# Patient Record
Sex: Female | Born: 1935 | Race: White | Hispanic: No | Smoking: Never smoker
Health system: Southern US, Community
[De-identification: ages and names within clinical notes are randomized; demographics above are authoritative.]

## PROBLEM LIST (undated history)

## (undated) DIAGNOSIS — D649 Anemia, unspecified: Secondary | ICD-10-CM

## (undated) DIAGNOSIS — I480 Paroxysmal atrial fibrillation: Secondary | ICD-10-CM

## (undated) DIAGNOSIS — H9190 Unspecified hearing loss, unspecified ear: Secondary | ICD-10-CM

## (undated) DIAGNOSIS — I2510 Atherosclerotic heart disease of native coronary artery without angina pectoris: Secondary | ICD-10-CM

## (undated) DIAGNOSIS — R55 Syncope and collapse: Secondary | ICD-10-CM

## (undated) DIAGNOSIS — R609 Edema, unspecified: Secondary | ICD-10-CM

## (undated) DIAGNOSIS — I10 Essential (primary) hypertension: Secondary | ICD-10-CM

## (undated) DIAGNOSIS — J069 Acute upper respiratory infection, unspecified: Secondary | ICD-10-CM

## (undated) DIAGNOSIS — Z950 Presence of cardiac pacemaker: Secondary | ICD-10-CM

## (undated) DIAGNOSIS — I442 Atrioventricular block, complete: Secondary | ICD-10-CM

## (undated) DIAGNOSIS — R600 Localized edema: Secondary | ICD-10-CM

## (undated) DIAGNOSIS — N183 Chronic kidney disease, stage 3 (moderate): Secondary | ICD-10-CM

## (undated) DIAGNOSIS — K219 Gastro-esophageal reflux disease without esophagitis: Secondary | ICD-10-CM

## (undated) DIAGNOSIS — R1013 Epigastric pain: Secondary | ICD-10-CM

## (undated) DIAGNOSIS — I255 Ischemic cardiomyopathy: Secondary | ICD-10-CM

## (undated) HISTORY — DX: Syncope and collapse: R55

## (undated) HISTORY — DX: Atrioventricular block, complete: I44.2

## (undated) HISTORY — DX: Localized edema: R60.0

## (undated) HISTORY — DX: Essential (primary) hypertension: I10

## (undated) HISTORY — PX: OTHER SURGICAL HISTORY: SHX169

## (undated) HISTORY — PX: INSERT / REPLACE / REMOVE PACEMAKER: SUR710

## (undated) HISTORY — DX: Edema, unspecified: R60.9

---

## 1997-12-15 ENCOUNTER — Inpatient Hospital Stay (HOSPITAL_COMMUNITY): Admission: RE | Admit: 1997-12-15 | Discharge: 1997-12-17 | Payer: Self-pay | Admitting: Cardiology

## 2004-08-06 ENCOUNTER — Ambulatory Visit: Payer: Self-pay

## 2004-10-11 ENCOUNTER — Ambulatory Visit: Payer: Self-pay

## 2004-11-17 ENCOUNTER — Ambulatory Visit: Payer: Self-pay | Admitting: Internal Medicine

## 2004-12-20 ENCOUNTER — Ambulatory Visit: Payer: Self-pay | Admitting: Internal Medicine

## 2005-01-20 ENCOUNTER — Ambulatory Visit: Payer: Self-pay | Admitting: Internal Medicine

## 2005-02-21 ENCOUNTER — Ambulatory Visit: Payer: Self-pay | Admitting: Internal Medicine

## 2005-03-29 ENCOUNTER — Ambulatory Visit: Payer: Self-pay | Admitting: Internal Medicine

## 2005-05-02 ENCOUNTER — Ambulatory Visit: Payer: Self-pay | Admitting: Internal Medicine

## 2005-06-03 ENCOUNTER — Ambulatory Visit: Payer: Self-pay | Admitting: Family Medicine

## 2005-07-04 ENCOUNTER — Ambulatory Visit: Payer: Self-pay | Admitting: Internal Medicine

## 2005-08-09 ENCOUNTER — Ambulatory Visit: Payer: Self-pay | Admitting: Internal Medicine

## 2005-09-09 ENCOUNTER — Ambulatory Visit: Payer: Self-pay | Admitting: Internal Medicine

## 2005-10-13 ENCOUNTER — Ambulatory Visit: Payer: Self-pay | Admitting: Internal Medicine

## 2005-11-11 ENCOUNTER — Ambulatory Visit: Payer: Self-pay | Admitting: Internal Medicine

## 2005-12-12 ENCOUNTER — Ambulatory Visit: Payer: Self-pay | Admitting: Internal Medicine

## 2006-01-13 ENCOUNTER — Ambulatory Visit: Payer: Self-pay | Admitting: Internal Medicine

## 2006-02-10 ENCOUNTER — Ambulatory Visit: Payer: Self-pay | Admitting: Internal Medicine

## 2006-03-10 ENCOUNTER — Ambulatory Visit: Payer: Self-pay | Admitting: Internal Medicine

## 2006-04-17 ENCOUNTER — Ambulatory Visit: Payer: Self-pay | Admitting: Internal Medicine

## 2006-05-22 ENCOUNTER — Ambulatory Visit: Payer: Self-pay | Admitting: Internal Medicine

## 2006-07-04 ENCOUNTER — Ambulatory Visit: Payer: Self-pay | Admitting: Internal Medicine

## 2006-08-01 ENCOUNTER — Ambulatory Visit: Payer: Self-pay | Admitting: Internal Medicine

## 2006-08-29 ENCOUNTER — Ambulatory Visit: Payer: Self-pay | Admitting: Internal Medicine

## 2006-09-26 ENCOUNTER — Ambulatory Visit: Payer: Self-pay | Admitting: Internal Medicine

## 2006-10-24 ENCOUNTER — Ambulatory Visit: Payer: Self-pay | Admitting: Internal Medicine

## 2006-11-20 ENCOUNTER — Ambulatory Visit: Payer: Self-pay | Admitting: Internal Medicine

## 2007-01-16 ENCOUNTER — Ambulatory Visit: Payer: Self-pay | Admitting: Internal Medicine

## 2007-02-12 ENCOUNTER — Ambulatory Visit: Payer: Self-pay | Admitting: Internal Medicine

## 2007-03-13 ENCOUNTER — Ambulatory Visit: Payer: Self-pay | Admitting: Cardiology

## 2007-04-09 ENCOUNTER — Ambulatory Visit: Payer: Self-pay | Admitting: Internal Medicine

## 2007-05-07 ENCOUNTER — Ambulatory Visit: Payer: Self-pay | Admitting: Internal Medicine

## 2007-06-04 ENCOUNTER — Ambulatory Visit: Payer: Self-pay | Admitting: Internal Medicine

## 2007-07-03 ENCOUNTER — Ambulatory Visit: Payer: Self-pay | Admitting: Internal Medicine

## 2007-07-31 ENCOUNTER — Ambulatory Visit: Payer: Self-pay | Admitting: Internal Medicine

## 2007-08-28 ENCOUNTER — Ambulatory Visit: Payer: Self-pay | Admitting: Internal Medicine

## 2007-09-24 ENCOUNTER — Ambulatory Visit: Payer: Self-pay | Admitting: Internal Medicine

## 2007-12-25 ENCOUNTER — Ambulatory Visit: Payer: Self-pay | Admitting: Internal Medicine

## 2008-02-05 ENCOUNTER — Ambulatory Visit: Payer: Self-pay | Admitting: Internal Medicine

## 2008-02-19 ENCOUNTER — Ambulatory Visit: Payer: Self-pay | Admitting: Internal Medicine

## 2008-02-19 LAB — CONVERTED CEMR LAB
BUN: 25 mg/dL — ABNORMAL HIGH (ref 6–23)
Basophils Absolute: 0 10*3/uL (ref 0.0–0.1)
Eosinophils Absolute: 0.2 10*3/uL (ref 0.0–0.7)
Eosinophils Relative: 3.1 % (ref 0.0–5.0)
GFR calc Af Amer: 57 mL/min
GFR calc non Af Amer: 47 mL/min
HCT: 29.2 % — ABNORMAL LOW (ref 36.0–46.0)
MCHC: 33.4 g/dL (ref 30.0–36.0)
MCV: 83.8 fL (ref 78.0–100.0)
Monocytes Absolute: 0.5 10*3/uL (ref 0.1–1.0)
Platelets: 191 10*3/uL (ref 150–400)
Potassium: 4 meq/L (ref 3.5–5.1)
RDW: 16.2 % — ABNORMAL HIGH (ref 11.5–14.6)
WBC: 5.7 10*3/uL (ref 4.5–10.5)

## 2008-02-26 ENCOUNTER — Ambulatory Visit (HOSPITAL_COMMUNITY): Admission: RE | Admit: 2008-02-26 | Discharge: 2008-02-26 | Payer: Self-pay | Admitting: Internal Medicine

## 2008-02-26 ENCOUNTER — Ambulatory Visit: Payer: Self-pay | Admitting: Internal Medicine

## 2008-03-12 ENCOUNTER — Ambulatory Visit: Payer: Self-pay

## 2008-08-19 ENCOUNTER — Ambulatory Visit: Payer: Self-pay | Admitting: Internal Medicine

## 2008-12-31 ENCOUNTER — Encounter (INDEPENDENT_AMBULATORY_CARE_PROVIDER_SITE_OTHER): Payer: Self-pay | Admitting: *Deleted

## 2009-03-17 ENCOUNTER — Ambulatory Visit: Payer: Self-pay | Admitting: Internal Medicine

## 2009-03-17 DIAGNOSIS — Z8669 Personal history of other diseases of the nervous system and sense organs: Secondary | ICD-10-CM | POA: Insufficient documentation

## 2009-03-17 HISTORY — DX: Personal history of other diseases of the nervous system and sense organs: Z86.69

## 2009-05-04 ENCOUNTER — Ambulatory Visit: Payer: Self-pay | Admitting: Internal Medicine

## 2009-08-03 ENCOUNTER — Ambulatory Visit: Payer: Self-pay | Admitting: Internal Medicine

## 2009-08-24 ENCOUNTER — Ambulatory Visit: Payer: Self-pay | Admitting: Internal Medicine

## 2009-12-13 ENCOUNTER — Ambulatory Visit: Payer: Self-pay | Admitting: Internal Medicine

## 2010-05-07 ENCOUNTER — Ambulatory Visit: Payer: Self-pay | Admitting: Internal Medicine

## 2010-08-12 DIAGNOSIS — E559 Vitamin D deficiency, unspecified: Secondary | ICD-10-CM

## 2010-08-12 DIAGNOSIS — E785 Hyperlipidemia, unspecified: Secondary | ICD-10-CM

## 2010-08-12 DIAGNOSIS — I10 Essential (primary) hypertension: Secondary | ICD-10-CM | POA: Insufficient documentation

## 2010-08-12 HISTORY — DX: Vitamin D deficiency, unspecified: E55.9

## 2010-08-12 HISTORY — DX: Essential (primary) hypertension: I10

## 2010-08-12 HISTORY — DX: Hyperlipidemia, unspecified: E78.5

## 2010-08-30 ENCOUNTER — Ambulatory Visit: Payer: Self-pay | Admitting: Internal Medicine

## 2010-10-12 NOTE — Cardiovascular Report (Signed)
Summary: TTM   TTM   Imported By: Roderic Ovens 12/25/2009 15:58:51  _____________________________________________________________________  External Attachment:    Type:   Image     Comment:   External Document

## 2010-10-12 NOTE — Procedures (Signed)
Summary: Cardiology Device Clinic    Allergies: No Known Drug Allergies  PPM Specifications Following MD:  Sherryl Manges, MD     PPM Vendor:  St [name]     PPM Model Number:  [phone]     PPM Serial Number:  [id number] PPM DOI:  02/26/2008     PPM Implanting MD:  Sherryl Manges, MD  Lead 1    Location: RA     DOI: 12/16/1997     Model #: [id number]     Serial #: [id number]     Status: active Lead 2    Location: RV     DOI: 12/16/1997     Model #: 1346T     Serial #: [id number]     Status: active   Indications:  Syncope, complete heart block   PPM Follow Up Remote Check?  No Battery Voltage:  2.80 V     Battery Est. Longevity:  10 years     Pacer Dependent:  No       PPM Device Measurements Atrium  Amplitude: 1.7 mV, Impedance: 414 ohms, Threshold: 0.5 V at 0.4 msec Right Ventricle  Amplitude: 7.5 mV, Impedance: 530 ohms, Threshold: 0.5 V at 0.4 msec  Episodes MS Episodes:  14     Percent Mode Switch:  <1%     Coumadin:  No Atrial Pacing:  38%     Ventricular Pacing:  37%  Parameters Mode:  DDDR     Lower Rate Limit:  70     Upper Rate Limit:  105 Paced AV Delay:  250     Sensed AV Delay:  250 Next Cardiology Appt Due:  10/13/2010 Tech Comments:  No parameter changes.  14 mode switch episodes the longest 5:25 hours, - coumadin, ventricular rates 80-170bpm.  TTM's with [employer]. ROV 3months in the [place] office to check A-fib episodes and turn off EGM storage at that time.   Altha Harm, LPN  May 07, 2010 11:53 AM

## 2010-10-14 NOTE — Cardiovascular Report (Signed)
Summary: Office Visit   Office Visit   Imported By: Roderic Ovens 09/24/2010 09:24:39  _____________________________________________________________________  External Attachment:    Type:   Image     Comment:   External Document

## 2010-10-14 NOTE — Procedures (Signed)
Summary: Cardiology Device Clinic  Medications Added POTASSIUM CHLORIDE CR 10 MEQ CR-CAPS (POTASSIUM CHLORIDE) one by mouth daily METOPROLOL SUCCINATE 50 MG XR24H-TAB (METOPROLOL SUCCINATE) 1 1/2 by mouth daily ETODOLAC 400 MG TABS (ETODOLAC) as needed ZESTRIL 40 MG TABS (LISINOPRIL) one by mouth daily FLONASE 50 MCG/ACT SUSP (FLUTICASONE PROPIONATE) as needed      Allergies Added: ! PENICILLIN  Current Medications (verified): 1)  Aspirin 81 Mg Tabs (Aspirin) .... Once Daily 2)  Gemfibrozil 600 Mg Tabs (Gemfibrozil) .... Once Am, Once Pm 3)  Potassium Chloride Cr 10 Meq Cr-Caps (Potassium Chloride) .... One By Mouth Daily 4)  Furosemide 20 Mg Tabs (Furosemide) .... Once Daily 5)  Cyanocobalamin 1000 Mcg/ml Soln (Cyanocobalamin) .[name].. 1 Injection Monthly 6)  Metoprolol Succinate 50 Mg Xr24h-Tab (Metoprolol Succinate) .[name].. 1 1/2 By Mouth Daily 7)  Etodolac 400 Mg Tabs (Etodolac) .... As Needed 8)  Zestril 40 Mg Tabs (Lisinopril) .... One By Mouth Daily 9)  Flonase 50 Mcg/act Susp (Fluticasone Propionate) .... As Needed  Allergies (verified): 1)  ! Penicillin  PPM Specifications Following MD:  Sherryl Manges, MD     PPM Vendor:  St [name]     PPM Model Number:  [id number]     PPM Serial Number:  [id number] PPM DOI:  02/26/2008     PPM Implanting MD:  Sherryl Manges, MD  Lead 1    Location: RA     DOI: 12/16/1997     Model #: 1388TC     Serial #: [id number]     Status: active Lead 2    Location: RV     DOI: 12/16/1997     Model #: 1346T     Serial #: [id number]     Status: active   Indications:  Syncope, complete heart block   PPM Follow Up Remote Check?  No Battery Voltage:  2.79 V     Battery Est. Longevity:  4.5 years     Pacer Dependent:  No       PPM Device Measurements Atrium  Amplitude: 2.0 mV, Impedance: 445 ohms, Threshold: 0.5 V at 0.4 msec Right Ventricle  Amplitude: 9.8 mV, Impedance: 574 ohms, Threshold: 0.5 V at 0.4 msec  Episodes MS Episodes:  28     Percent Mode Switch:  <1%      Coumadin:  No Atrial Pacing:  53%     Ventricular Pacing:  60%  Parameters Mode:  DDDR     Lower Rate Limit:  70     Upper Rate Limit:  105 Paced AV Delay:  250     Sensed AV Delay:  250 Next Cardiology Appt Due:  11/11/2010 Tech Comments:  Ms. Krull was seen today in [place] for review of any mode switch episodes since last seen 05/07/10, at which time she had A-fib with RVR lasting 7 hours.  On interrogation today she has had a total of 28 mode switch episodes the longest 4:50 hours.  Her ventricular rates were controlled in the 65-85 bpm range.  I will show this to Dr. Graciela Husbands and have Ms. Boulet return in 3 months to the [place] office. .sign

## 2010-10-24 ENCOUNTER — Encounter: Payer: Self-pay | Admitting: Internal Medicine

## 2010-10-24 DIAGNOSIS — I442 Atrioventricular block, complete: Secondary | ICD-10-CM

## 2010-11-18 NOTE — Cardiovascular Report (Signed)
Summary: TTM   TTM   Imported By: Roderic Ovens 11/10/2010 14:42:22  _____________________________________________________________________  External Attachment:    Type:   Image     Comment:   External Document

## 2011-01-10 ENCOUNTER — Encounter: Payer: Self-pay | Admitting: Internal Medicine

## 2011-01-12 ENCOUNTER — Encounter: Payer: Self-pay | Admitting: Internal Medicine

## 2011-01-17 DIAGNOSIS — N3001 Acute cystitis with hematuria: Secondary | ICD-10-CM

## 2011-01-17 DIAGNOSIS — J209 Acute bronchitis, unspecified: Secondary | ICD-10-CM | POA: Insufficient documentation

## 2011-01-17 HISTORY — DX: Acute cystitis with hematuria: N30.01

## 2011-01-17 HISTORY — DX: Acute bronchitis, unspecified: J20.9

## 2011-01-24 ENCOUNTER — Encounter: Payer: Self-pay | Admitting: Internal Medicine

## 2011-01-24 DIAGNOSIS — I442 Atrioventricular block, complete: Secondary | ICD-10-CM

## 2011-01-25 NOTE — Assessment & Plan Note (Signed)
[facility]                         ELECTROPHYSIOLOGY OFFICE NOTE   NAME:Judith Flores, Judith Flores                       MRN:          [id number]  DATE:08/19/2008                            DOB:          [birth year]    Mrs. Bonenberger is seen in followup for pacemaker implant for syncope and  heart block.  She continues to do well.  Her edema that was evident last  May prior to device generator placement is much improved.  She is using  p.r.n. Lasix for this.   She continues to take Zestoretic 20/12.5, potassium 10, aspirin, and  gemfibrozil.  She is also on iron being managed by [facility] for anemia.   On examination, her blood pressure was 117/66, her pulse was 91.  Her  weight was 193, which was down 12 pounds.  Her lungs were clear.  Heart  sounds were regular.  The device stump was well healed.  The abdomen was  soft.  The extremities were without edema.   Interrogation of her St. [name] pulse generator demonstrates a P-wave of  2.2, the impedance of 100, threshold 0.5 at 0.4.  The R-wave was greater  than 12, the impedance of 1040 and threshold of 1 volt at 0.4.  Battery  voltage is 2.79.   IMPRESSION:  1. Intermittent complete heart block.  2. Status post pacer for the above.  3. Syncope related to the above.  4. Edema - resolved.   Mrs. Panther is doing well.  There are no acute issues.  We will see her  again in 9 months' time with the anniversary of the device.     [name], MD, [facility]  Electronically Signed    SCK/MedQ  DD: 08/19/2008  DT: 08/20/2008  Job #: [id number]

## 2011-01-25 NOTE — Assessment & Plan Note (Signed)
Judith Flores                         ELECTROPHYSIOLOGY OFFICE NOTE   NAME:Judith Flores                       MRN:          [id number]  DATE:02/05/2008                            DOB:          12-01-1933    Judith Flores is status post pacemaker implantation for syncope and  intermittent complete heart block.  She continues to do quite well  without complaints of chest pain or shortness of breath.  She has noted,  however, significant peripheral edema; she has chronic left greater than  right edema, but this is considerably worse.  She denies any change in  her diet or long travel.  She has taken her p.r.n. furosemide of late  with some mild improvement.   Her medications include the above as well as Zestoretic 20/12.5,  aspirin, Niaspan, gemfibrozil, and potassium.   PHYSICAL EXAMINATION:  VITAL SIGNS:  Her blood pressure today was 146/87  with a pulse of 93 and her weight is 205 compared to 194 a year ago.  NECK:  Veins were flat.  LUNGS:  Clear.  HEART:  Sounds were regular with an early systolic murmur.  ABDOMEN:  Soft.  EXTREMITIES:  She had 2+ edema in the left and 1+ edema in the right.  SKIN:  Warm and dry.   Interrogation of her St. [name] Pacesetter pulse generator demonstrates an  impedance in the atrium of 520 and in the ventricle of 699.  Battery  voltage is 2.39.  She has reached ERI.  Recommendations were that her  device be changed within a month.   IMPRESSION:  1. Intermittent complete heart block and sinus node dysfunction.  2. Status post pacer for the above.  3. Syncope related to intermittent complete heart block and sinus node      dysfunction.  4. Peripheral edema - asymmetric.   We will plan to undertake device generator replacement.  I reviewed with  her the potential benefits and potential risks including but not limited  to infection and lead fracture.  She understands these risks and would  like to proceed.   We  will also put her back on her furosemide to take every other day for  10 days to see if we can ameliorate some of her edema.    Judith Salvia, MD, [facility]  Electronically Signed   SCK/MedQ  DD: 02/05/2008  DT: 02/05/2008  Job #: [id number]   cc:   Judith Flores

## 2011-01-25 NOTE — Assessment & Plan Note (Signed)
[facility]                        [place] CARDIOLOGY OFFICE NOTE   JARROD, BODKINS                       MRN:          [id number]  DATE:08/24/2009                            DOB:          02/28/1934    Judith Flores is seen in follow up for intermittent complete heart block  with a previously implanted pacemaker for which she underwent generator  replacement in the spring of 2009.  She has had no recurrent syncope.  She has no complaints of chest pain, shortness of breath, or exercise  intolerance.   CURRENT MEDICATIONS:  1. Lisinopril hydrochlorothiazide.  2. Furosemide on an as needed basis.  3. Aspirin.  4. Gemfibrozil 600 b.i.d.  5. Potassium.  6. Iron.   EXAMINATION:  VITAL SIGNS:  Her blood pressure is 104/59 with a pulse of  91, weight was 193, which is down a few pounds.  O2 saturation 96.  CONSTITUTIONAL:  She was in no acute distress.  LUNGS:  Clear.  HEART:  Sounds were regular.  ABDOMEN:  Soft.  EXTREMITIES:  Without edema.   DIAGNOSTIC STUDIES:  Interrogation of her pacemaker (St. [name] Zephyr)  with an amplitude in the atrium of 2.5 and ventricle of 8.1, thresholds  were 0.5 and 0.4 in both chambers.  Atrial impedance was 455 and the  ventricular impedance was 840.  Heart rate excursion is adequate.  She  is atrially paced and ventricularly paced both about 36% of the time and  A-sensed/V-sensed about 63% of the time.   IMPRESSION:  1. Intermittent complete heart block.  2. Syncope.  3. Evidence of intermittent sinus node dysfunction.  4. Hypertension.  5. Peripheral edema.   MEDICAL DECISION MAKING:  [name] is doing well.  We will plan to  continue her on her current medications.  Her blood work was checked  today by her primary care physician; we do not have those results.   Will see her again in 6 months' time in the device clinic.     [name], MD, [facility]  Electronically Signed    SCK/MedQ  DD:  08/24/2009  DT: 08/24/2009  Job #: [id number]   cc:   [facility]

## 2011-01-25 NOTE — Discharge Summary (Signed)
NAMERECHEL, DELOSREYES NO.:  [id number]   MEDICAL RECORD NO.:  [id number]          PATIENT TYPE:  OIB   LOCATION:  2852                         FACILITY:  [facility]   PHYSICIAN:  [name], MD, FACCDATE OF BIRTH:  [birth year]   DATE OF ADMISSION:  02/26/2008  DATE OF DISCHARGE:  02/26/2008                               DISCHARGE SUMMARY   Dictation time and exam greater than 25 minutes.   FINAL DIAGNOSES:  1. Pacemaker implanted for syncope and intermittent complete heart      block is at elective replacement indicator.  2. Generator change February 26, 2008, with implant of the [place]. [name] [place]      XL DR [id number] dual-chamber pacemaker Dr. Sherryl Manges.   SECONDARY DIAGNOSES:  1. History of intermittent complete heart block, sinus node      dysfunction, and syncope.  2. Peripheral edema/asymmetric, procedure February 26, 2008, explant of      existing dual-chamber pacemaker with implant of the St. [name] Zephyr      XL DR dual-chamber pacemaker Dr. Sherryl Manges.   BRIEF HISTORY:  Judith Flores is a 77 year old female, she is status post  pacemaker implantation for syncope and intermittent complete heart  block. Currently, she is not having complaints of chest pain or  shortness of breath.  She does note significant peripheral edema.  Her  Lasix was increased from every other day dosing on the office visit, Feb 05, 2008.   Interrogation of her [employer] which is her current device  shows that it has reached ERI.  We will change the device out within a  month.   HOSPITAL COURSE:  The patient presents electively on February 26, 2008, the  [facility]. [name] pacemaker was explanted.  The St. [name] Zephyr device was  implanted.  The patient had no postprocedural complications.  She will  go home with a prescription for Darvocet for mild pain at the pacemaker  site.  She is instructed to remove the bandage on the morning of  Wednesday, February 27, 2008, and to leave the incision open  to the air.  She is to keep the incision dry for the next 7 days, to sponge bathe  until Tuesday, March 04, 2008.   MEDICATIONS AT DISCHARGE:  1. Darvocet-N 100 1-2 tabs every 4-6 hours as needed for pain.  2. Klor-Con 10 mEq twice daily.  3. Lasix 20 mg daily as needed.  4. Gemfibrozil 600 mg twice daily.  5. Niaspan 500 mg daily.  6. Enteric-coated aspirin 81 mg daily.  7. Zestoretic 200/12.5 one tab daily.  8. Ferrous sulfate 2 tabs daily.  9. Vitamin B12 shot monthly.   She follows up at [facility], [address]  Wednesday March 12, 2008, at 9:20 for incision check.   LABORATORY STUDIES:  Pertinent to this admission, protime is 12.4, INR  1.0.  Sodium 140, potassium 4, chloride 106, carbonate 27, glucose 104,  BUN is 25, creatinine 1.2, white cells 5.7, hemoglobin 9.7, hematocrit  29.2, and platelets 191,000.      Maple Mirza, PA  [name], MD, [facility]  Electronically Signed    GM/MEDQ  D:  02/26/2008  T:  02/27/2008  Job:  [id number]   cc:   Laray Anger

## 2011-01-25 NOTE — Assessment & Plan Note (Signed)
[facility]                        [place] CARDIOLOGY OFFICE NOTE   Judith Flores, Judith Flores                       MRN:          [id number]  DATE:05/07/2010                            DOB:          June 13, 1934    Judith Flores is seen in followup for an intermittent complete heart block  with a previously implanted pacemaker for which she underwent generator  replaced in the spring of 2009.  She has had no recurrent syncope.  She  has had no chest pain or shortness of breath.  She does have problems  with peripheral edema.   Her medications include aspirin, gemfibrozil, furosemide p.r.n.,  lisinopril and metoprolol succinate.   On exam today, her blood pressure was not too bad at 143/79, her weight  was up 6 pounds in the last year at 199.8, her pulse was 77.  Her lungs  were clear.  Heart sounds were regular.  The abdomen was soft.  Extremities were without edema.  She is in no acute distress.   Interrogation of her Zephyr pulse generator demonstrates a P-wave of 1.7  with an R-wave of 7.5, impedances were 395/574, thresholds were 0.5 and  0.4 in both chambers.   There was 1 episode of atrial fibrillation identified on December 30  associated with a rapid ventricular response.  The duration of this  episode was approximately 7 hours with an episode of transient  undersensing breaking the episode down into 2.   IMPRESSION:  1. Intermittent complete heart block.  2. Status post pacer for the above.  3. Episodic atrial fibrillation of maximum duration 7 hours.  4. Thromboembolic risk factors notable for age - 2, hypertension - 1,      gender - 1, CHADVASCs score of 4.  5. Peripheral edema.   As relates to the latter, I have suggested that she use her p.r.n.  diuretic a little bit more aggressively.   As relates to atrial fibrillation, I do not think in the absence of a  higher CHADSVASCs score that anticoagulation for a 7-hour episode is  clearly  indicated.  We will continue to track this; unfortunately her  device is not followed by Sabino Gasser who will plan to see her every 3  months as we tried to clarify the length of these episodes of atrial  fibrillation and verifying the fact that they  are appropriately designated as such, but I suspect that is true given  the rapid atrial rate as well as rapid associated ventricular rate.     [name], MD, [facility]  Electronically Signed    SCK/MedQ  DD: 05/07/2010  DT: 05/08/2010  Job #: [id number]   cc:   [facility]

## 2011-01-28 NOTE — Assessment & Plan Note (Signed)
[facility]                         ELECTROPHYSIOLOGY OFFICE NOTE   NAME:HANNONEretria, Manternach                       MRN:          [id number]  DATE:01/16/2007                            DOB:          1933-10-30    Ms. Monda is seen for syncope and intermittent complete heart block  __________ left bundle branch block. She is feeling terrific and doing  well. She does have some peripheral edema.   Her blood pressure medications include Zestoretic.   PHYSICAL EXAMINATION:  VITAL SIGNS:  Her blood pressure is 136/73.  LUNGS:  Clear.  HEART:  Sounds were regular.  without murmurs or gallops.  EXTREMITIES:  There was 1+ peripheral edema.   Interrogation of her St. [name] pulse generator demonstrated normal  pacemaker function with an R wave of 5 and P wave was about 2.  Thresholds were less than 1 volt at 0.4 on both chambers, battery  voltage was 2.7.   IMPRESSION:  1. Intermittent complete heart block.  2. Status post pacer for the above.  3. Left bundle branch block.  4. Peripheral edema.   Ms. Whetsel is stable. We will plan to give Ms. Mcnay a prescription for  Lasix 20 mg to take as needed for fluid overload.   We will see her again in 1 year's time.     [name], MD, [facility]  Electronically Signed    SCK/MedQ  DD: 01/16/2007  DT: 01/16/2007  Job #: [id number]   cc:   Dr. Montez Morita

## 2011-02-28 ENCOUNTER — Encounter (INDEPENDENT_AMBULATORY_CARE_PROVIDER_SITE_OTHER): Payer: PRIVATE HEALTH INSURANCE | Admitting: Internal Medicine

## 2011-02-28 ENCOUNTER — Other Ambulatory Visit: Payer: Self-pay

## 2011-02-28 DIAGNOSIS — R609 Edema, unspecified: Secondary | ICD-10-CM

## 2011-02-28 DIAGNOSIS — I4891 Unspecified atrial fibrillation: Secondary | ICD-10-CM

## 2011-02-28 DIAGNOSIS — I442 Atrioventricular block, complete: Secondary | ICD-10-CM

## 2011-02-28 DIAGNOSIS — Z950 Presence of cardiac pacemaker: Secondary | ICD-10-CM

## 2011-03-01 NOTE — Assessment & Plan Note (Signed)
[facility]                       [place] CARDIOLOGY OFFICE NOTE  Judith Flores, Judith Flores                       MRN:          [id number] DATE:02/28/2011                            DOB:          Aug 31, 1934   Judith Flores is seen in follow-up for pacemaker implanted for intermittent complete heart block.  She underwent device generator replacement in spring 2009.  She has had no recurrent syncope.  She denies chest pain or shortness of breath.  She has had some problems recently with peripheral edema.  MEDICATIONS:  Currently include: 1. Aspirin. 2. Furosemide. 3. Lisinopril 40. 4. Metoprolol 75 b.i.d. 5. Potassium 10 b.i.d. 6. Cholestyramine.  EXAMINATION:  VITAL SIGNS:  Blood pressure is 158/87, pulse of 69. NECK:  Veins are flat.  Her carotids are brisk and full bilaterally without bruits. HEENT:  Normal. BACK:  Without kyphosis. LUNGS:  Clear. HEART:  Regular without murmurs or gallops. ABDOMEN:  Soft. EXTREMITIES:  Have 1 to 2+ edema.  Interrogation of her St. [name] pulse generator demonstrates a P-wave of 2 with a pace impedance of 421, a threshold of 0.375 to 0.4, PR-wave was 0.5 with a pace impedance of 524.  There was no intrinsic ventricular rhythm.  IMPRESSION: 1. Complete heart block. 2. Status post pacer for the above. 3. Hypertension. 4. Peripheral edema. 5. Paroxysmal atrial fibrillation with the longest episode of 7 hours     or so.  We will plan to increase her diuretic for a short period of time.  It is probably appropriate at some point to think about oral anticoagulation, although the duration of atrial fibrillation of 7 hours may not be sufficient to justify that yet.  We will see her again in one year's time.    [name], MD, [facility]    SCK/MedQ  DD: 02/28/2011  DT: 03/01/2011  Job #: [id number]  cc:   [facility]

## 2011-03-01 NOTE — Letter (Deleted)
February 28, 2011    RE:  LANASIA, PORRAS MRN:  [id number]  /  DOB:  1934-05-20  Judith Flores is seen in follow-up for pacemaker implanted for intermittent complete heart block.  She underwent device generator replacement in spring 2009.  She has had no recurrent syncope.  She denies chest pain or shortness of breath.  She has had some problems recently with peripheral edema.  MEDICATIONS:  Currently include: 1. Aspirin. 2. Furosemide. 3. Lisinopril 40. 4. Metoprolol 75 b.i.d. 5. Potassium 10 b.i.d. 6. Cholestyramine.  EXAMINATION:  VITAL SIGNS:  Blood pressure is 158/87, pulse of 69. NECK:  Veins are flat.  Her carotids are brisk and full bilaterally without bruits. HEENT:  Normal. BACK:  Without kyphosis. LUNGS:  Clear. HEART:  Regular without murmurs or gallops. ABDOMEN:  Soft. EXTREMITIES:  Have 1 to 2+ edema.  Interrogation of her St. [name] pulse generator demonstrates a P-wave of 2 with a pace impedance of 421, a threshold of 0.375 to 0.4, PR-wave was 0.5 with a pace impedance of 524.  There was no intrinsic ventricular rhythm.  IMPRESSION: 1. Complete heart block. 2. Status post pacer for the above. 3. Hypertension. 4. Peripheral edema. 5. Paroxysmal atrial fibrillation with the longest episode of 7 hours     or so.  We will plan to increase her diuretic for a short period of time.  It is probably appropriate at some point to think about oral anticoagulation, although the duration of atrial fibrillation of 7 hours may not be sufficient to justify that yet.  We will see her again in one year's time.   Sincerely,     [name], MD, [facility]   SCK/MedQ  DD: 02/28/2011  DT: 03/01/2011  Job #: [id number]  CC:    [facility]

## 2011-06-01 ENCOUNTER — Ambulatory Visit (INDEPENDENT_AMBULATORY_CARE_PROVIDER_SITE_OTHER): Payer: PRIVATE HEALTH INSURANCE | Admitting: *Deleted

## 2011-06-01 ENCOUNTER — Encounter: Payer: Self-pay | Admitting: Internal Medicine

## 2011-06-01 DIAGNOSIS — I442 Atrioventricular block, complete: Secondary | ICD-10-CM

## 2011-06-01 LAB — PACEMAKER DEVICE OBSERVATION
BAMS-0001: 150 {beats}/min
BAMS-0003: 70 {beats}/min
BATTERY VOLTAGE: 2.81 V
DEVICE MODEL PM: 1235689
RV LEAD THRESHOLD: 0.5 V

## 2011-09-16 ENCOUNTER — Encounter: Payer: PRIVATE HEALTH INSURANCE | Admitting: Internal Medicine

## 2011-11-17 ENCOUNTER — Encounter: Payer: Self-pay | Admitting: *Deleted

## 2011-11-17 ENCOUNTER — Encounter: Payer: Self-pay | Admitting: Internal Medicine

## 2011-11-17 ENCOUNTER — Ambulatory Visit (INDEPENDENT_AMBULATORY_CARE_PROVIDER_SITE_OTHER): Payer: PRIVATE HEALTH INSURANCE | Admitting: Internal Medicine

## 2011-11-17 VITALS — BP 118/78 | HR 69 | Wt 205.4 lb

## 2011-11-17 DIAGNOSIS — T82190A Other mechanical complication of cardiac electrode, initial encounter: Secondary | ICD-10-CM | POA: Insufficient documentation

## 2011-11-17 DIAGNOSIS — I442 Atrioventricular block, complete: Secondary | ICD-10-CM

## 2011-11-17 DIAGNOSIS — Z950 Presence of cardiac pacemaker: Secondary | ICD-10-CM | POA: Insufficient documentation

## 2011-11-17 HISTORY — DX: Atrioventricular block, complete: I44.2

## 2011-11-17 HISTORY — DX: Other mechanical complication of cardiac electrode, initial encounter: T82.190A

## 2011-11-17 LAB — PACEMAKER DEVICE OBSERVATION
AL THRESHOLD: 0.5 V
BAMS-0001: 150 {beats}/min
DEVICE MODEL PM: 1235689
RV LEAD THRESHOLD: 0.5 V
VENTRICULAR PACING PM: 99

## 2011-11-17 NOTE — Assessment & Plan Note (Signed)
As above.

## 2011-11-17 NOTE — Assessment & Plan Note (Signed)
Stable post pacing 

## 2011-11-17 NOTE — Patient Instructions (Signed)
Your physician has recommended that you have a pacemaker system revision. This is done in the hospital and usually requires and overnight stay. Please see the instruction sheet given to you today for more information.

## 2011-11-17 NOTE — Assessment & Plan Note (Signed)
The patient's device was interrogated.  The information was reviewed. No changes were made in the programming.    The device was noted to have undergone polarity switch in the atrial lead in sept and prior to march2012 in the v lead  As she is device dependent, we will plan a revision of her system  I should note also low impedances have been measured on the atrial lead in the unipolar configuration though not in the ventricular lead  We have reviewed the benefits and risks of generator replacement.  These include but are not limited to lead fracture and infection.  The patient understands, agrees and is willing to proceed. But there also risks assoc with new lead placement

## 2011-11-17 NOTE — Progress Notes (Signed)
  HPI  Judith Flores is a 78 y.o. female  seen in followup for syncope and intermittent complete heart block. She continues to do well. She's had no positive lightheadedness or exercise intolerance.   She notes that a few months ago she noted thumping with her pacemaker  In various positions.    Past Medical History  Diagnosis Date  . Complete heart block     intermittent  . Atrial fibrillation   . Peripheral edema   . HTN (hypertension)   . Syncope     Past Surgical History  Procedure Date  . Insert / replace / remove pacemaker     St [name]    Current Outpatient Prescriptions  Medication Sig Dispense Refill  . aspirin 81 MG tablet Take 81 mg by mouth daily.        . Calcium Carbonate-Vitamin D (CALCIUM + D) 600-200 MG-UNIT TABS Take by mouth 2 (two) times daily.        . ergocalciferol (VITAMIN D2) 50000 UNITS capsule Take 50,000 Units by mouth once a week.        . etodolac (LODINE) 400 MG tablet Take 400 mg by mouth as needed.        . furosemide (LASIX) 20 MG tablet Take 20 mg by mouth daily as needed.       [name] gemfibrozil (LOPID) 600 MG tablet Take 600 mg by mouth 2 (two) times daily before a meal.        . lisinopril (PRINIVIL,ZESTRIL) 40 MG tablet Take 40 mg by mouth daily.        . metoprolol (TOPROL-XL) 50 MG 24 hr tablet Take 75 mg by mouth daily.        [name] omeprazole (PRILOSEC) 20 MG capsule Take 20 mg by mouth daily.      . potassium chloride (KLOR-CON) 10 MEQ CR tablet Take 10 mEq by mouth 2 (two) times daily.         Allergies  Allergen Reactions  . Penicillins     Review of Systems negative except from HPI and PMH  Physical Exam BP 118/78  Pulse 69  Wt 205 lb 6.4 oz (93.169 kg) Well developed and well nourished in no acute distress HENT normal E scleral and icterus clear Neck Supple JVP flat; carotids brisk and full Clear to ausculation Regular rate and rhythm, no murmurs gallops or rub Soft with active bowel sounds No clubbing cyanosis none  Edema Alert and oriented, grossly normal motor and sensory function Skin Warm and Dry   Assessment and  Plan

## 2011-11-23 ENCOUNTER — Other Ambulatory Visit: Payer: Medicare Other

## 2011-11-24 ENCOUNTER — Other Ambulatory Visit (INDEPENDENT_AMBULATORY_CARE_PROVIDER_SITE_OTHER): Payer: Medicare Other

## 2011-11-24 DIAGNOSIS — Z950 Presence of cardiac pacemaker: Secondary | ICD-10-CM

## 2011-11-24 DIAGNOSIS — I4891 Unspecified atrial fibrillation: Secondary | ICD-10-CM

## 2011-11-24 DIAGNOSIS — I442 Atrioventricular block, complete: Secondary | ICD-10-CM

## 2011-11-24 LAB — BASIC METABOLIC PANEL
CO2: 23 mEq/L (ref 19–32)
Calcium: 9.3 mg/dL (ref 8.4–10.5)
GFR: 54.14 mL/min — ABNORMAL LOW (ref >60.00)
Glucose, Bld: 109 mg/dL — ABNORMAL HIGH (ref 70–99)
Potassium: 3.7 mEq/L (ref 3.5–5.1)
Sodium: 142 mEq/L (ref 135–145)

## 2011-11-24 LAB — CBC WITH DIFFERENTIAL/PLATELET
Basophils Relative: 0.5 % (ref 0.0–3.0)
HCT: 34.7 % — ABNORMAL LOW (ref 36.0–46.0)
Hemoglobin: 11.2 g/dL — ABNORMAL LOW (ref 12.0–15.0)
Lymphocytes Relative: 22.8 % (ref 12.0–46.0)
Lymphs Abs: 1.5 10*3/uL (ref 0.7–4.0)
MCHC: 32.2 g/dL (ref 30.0–36.0)
Monocytes Relative: 7.6 % (ref 3.0–12.0)
Neutro Abs: 4.3 10*3/uL (ref 1.4–7.7)
RBC: 3.91 Mil/uL (ref 3.87–5.11)
RDW: 17.3 % — ABNORMAL HIGH (ref 11.5–14.6)

## 2011-11-25 ENCOUNTER — Encounter (HOSPITAL_COMMUNITY): Payer: Self-pay | Admitting: Pharmacy Technician

## 2011-11-30 MED ORDER — VANCOMYCIN HCL IN DEXTROSE 1-5 GM/200ML-% IV SOLN
1000.0000 mg | INTRAVENOUS | Status: AC
  Administered 2011-12-01: 1000 mg via INTRAVENOUS
  Filled 2011-11-30: qty 200

## 2011-11-30 MED ORDER — SODIUM CHLORIDE 0.9 % IR SOLN
80.0000 mg | Status: AC
  Administered 2011-12-01: 80 mg
  Filled 2011-11-30: qty 2

## 2011-12-01 ENCOUNTER — Encounter (HOSPITAL_COMMUNITY)
Admission: RE | Disposition: A | Discharge status: Home / Self Care | Payer: Self-pay | Source: Ambulatory Visit | Attending: Internal Medicine

## 2011-12-01 ENCOUNTER — Ambulatory Visit (HOSPITAL_COMMUNITY): Payer: Medicare Other

## 2011-12-01 ENCOUNTER — Ambulatory Visit (HOSPITAL_COMMUNITY)
Admission: RE | Admit: 2011-12-01 | Discharge: 2011-12-02 | Disposition: A | Discharge status: Home / Self Care | Payer: Medicare Other | Source: Ambulatory Visit | Attending: Internal Medicine | Admitting: Internal Medicine

## 2011-12-01 ENCOUNTER — Encounter (HOSPITAL_COMMUNITY): Payer: Self-pay | Admitting: General Practice

## 2011-12-01 DIAGNOSIS — I10 Essential (primary) hypertension: Secondary | ICD-10-CM | POA: Insufficient documentation

## 2011-12-01 DIAGNOSIS — T82190A Other mechanical complication of cardiac electrode, initial encounter: Secondary | ICD-10-CM

## 2011-12-01 DIAGNOSIS — Z950 Presence of cardiac pacemaker: Secondary | ICD-10-CM

## 2011-12-01 DIAGNOSIS — Y849 Medical procedure, unspecified as the cause of abnormal reaction of the patient, or of later complication, without mention of misadventure at the time of the procedure: Secondary | ICD-10-CM | POA: Insufficient documentation

## 2011-12-01 DIAGNOSIS — Z8669 Personal history of other diseases of the nervous system and sense organs: Secondary | ICD-10-CM

## 2011-12-01 DIAGNOSIS — I442 Atrioventricular block, complete: Secondary | ICD-10-CM

## 2011-12-01 HISTORY — DX: Anemia, unspecified: D64.9

## 2011-12-01 HISTORY — DX: Presence of cardiac pacemaker: Z95.0

## 2011-12-01 HISTORY — DX: Acute upper respiratory infection, unspecified: J06.9

## 2011-12-01 HISTORY — PX: PACEMAKER REVISION: SHX5482

## 2011-12-01 HISTORY — DX: Gastro-esophageal reflux disease without esophagitis: K21.9

## 2011-12-01 SURGERY — PACEMAKER REVISION
Anesthesia: LOCAL

## 2011-12-01 MED ORDER — GEMFIBROZIL 600 MG PO TABS
600.0000 mg | ORAL_TABLET | Freq: Every day | ORAL | Status: DC
Start: 2011-12-01 — End: 2011-12-02
  Administered 2011-12-02: 600 mg via ORAL
  Filled 2011-12-01 (×3): qty 1

## 2011-12-01 MED ORDER — ZOLPIDEM TARTRATE 5 MG PO TABS
5.0000 mg | ORAL_TABLET | Freq: Every evening | ORAL | Status: DC | PRN
  Administered 2011-12-02: 5 mg via ORAL
  Filled 2011-12-01 (×2): qty 1

## 2011-12-01 MED ORDER — SODIUM CHLORIDE 0.9 % IV SOLN: INTRAVENOUS | Status: AC

## 2011-12-01 MED ORDER — ONDANSETRON HCL 4 MG/2ML IJ SOLN: 4.0000 mg | Freq: Four times a day (QID) | INTRAMUSCULAR | Status: DC | PRN

## 2011-12-01 MED ORDER — SODIUM CHLORIDE 0.45 % IV SOLN: INTRAVENOUS | Status: DC

## 2011-12-01 MED ORDER — MUPIROCIN 2 % EX OINT
TOPICAL_OINTMENT | Freq: Two times a day (BID) | CUTANEOUS | Status: DC
  Administered 2011-12-02: 11:00:00 via NASAL
  Filled 2011-12-01: qty 22

## 2011-12-01 MED ORDER — LISINOPRIL 40 MG PO TABS
40.0000 mg | ORAL_TABLET | Freq: Every day | ORAL | Status: DC
  Administered 2011-12-02: 40 mg via ORAL
  Filled 2011-12-01 (×2): qty 1

## 2011-12-01 MED ORDER — HEPARIN (PORCINE) IN NACL 2-0.9 UNIT/ML-% IJ SOLN
INTRAMUSCULAR | Status: AC
  Filled 2011-12-01: qty 1000

## 2011-12-01 MED ORDER — ACETAMINOPHEN 325 MG PO TABS
325.0000 mg | ORAL_TABLET | ORAL | Status: DC | PRN
  Administered 2011-12-02: 650 mg via ORAL
  Filled 2011-12-01: qty 2

## 2011-12-01 MED ORDER — ASPIRIN 81 MG PO CHEW
81.0000 mg | CHEWABLE_TABLET | Freq: Every day | ORAL | Status: DC
  Administered 2011-12-01 – 2011-12-02 (×2): 81 mg via ORAL
  Filled 2011-12-01 (×2): qty 1

## 2011-12-01 MED ORDER — MUPIROCIN 2 % EX OINT
TOPICAL_OINTMENT | CUTANEOUS | Status: AC
  Filled 2011-12-01: qty 22

## 2011-12-01 MED ORDER — CHLORHEXIDINE GLUCONATE 4 % EX LIQD
60.0000 mL | Freq: Once | CUTANEOUS | Status: DC
  Filled 2011-12-01: qty 60

## 2011-12-01 MED ORDER — MIDAZOLAM HCL 5 MG/5ML IJ SOLN
INTRAMUSCULAR | Status: AC
  Filled 2011-12-01: qty 5

## 2011-12-01 MED ORDER — PANTOPRAZOLE SODIUM 40 MG PO TBEC
40.0000 mg | DELAYED_RELEASE_TABLET | Freq: Every day | ORAL | Status: DC
  Administered 2011-12-01 – 2011-12-02 (×2): 40 mg via ORAL
  Filled 2011-12-01 (×2): qty 1

## 2011-12-01 MED ORDER — SODIUM CHLORIDE 0.9 % IV SOLN: INTRAVENOUS | Status: DC

## 2011-12-01 MED ORDER — FENTANYL CITRATE 0.05 MG/ML IJ SOLN
INTRAMUSCULAR | Status: AC
  Filled 2011-12-01: qty 2

## 2011-12-01 MED ORDER — METOPROLOL SUCCINATE ER 50 MG PO TB24
75.0000 mg | ORAL_TABLET | Freq: Every day | ORAL | Status: DC
  Administered 2011-12-02: 75 mg via ORAL
  Filled 2011-12-01 (×2): qty 1

## 2011-12-01 MED ORDER — VANCOMYCIN HCL IN DEXTROSE 1-5 GM/200ML-% IV SOLN
1000.0000 mg | Freq: Two times a day (BID) | INTRAVENOUS | Status: AC
  Administered 2011-12-01: 1000 mg via INTRAVENOUS
  Filled 2011-12-01: qty 200

## 2011-12-01 MED ORDER — LIDOCAINE HCL (PF) 1 % IJ SOLN
INTRAMUSCULAR | Status: AC
  Filled 2011-12-01: qty 60

## 2011-12-01 MED ORDER — ASPIRIN 81 MG PO TABS: 81.0000 mg | ORAL_TABLET | Freq: Every day | ORAL | Status: DC

## 2011-12-01 NOTE — H&P (View-Only) (Signed)
  HPI  Judith Flores is a 78 y.o. female  seen in followup for syncope and intermittent complete heart block. She continues to do well. She's had no positive lightheadedness or exercise intolerance.   She notes that a few months ago she noted thumping with her pacemaker  In various positions.    Past Medical History  Diagnosis Date  . Complete heart block     intermittent  . Atrial fibrillation   . Peripheral edema   . HTN (hypertension)   . Syncope     Past Surgical History  Procedure Date  . Insert / replace / remove pacemaker     St [name]    Current Outpatient Prescriptions  Medication Sig Dispense Refill  . aspirin 81 MG tablet Take 81 mg by mouth daily.        . Calcium Carbonate-Vitamin D (CALCIUM + D) 600-200 MG-UNIT TABS Take by mouth 2 (two) times daily.        . ergocalciferol (VITAMIN D2) 50000 UNITS capsule Take 50,000 Units by mouth once a week.        . etodolac (LODINE) 400 MG tablet Take 400 mg by mouth as needed.        . furosemide (LASIX) 20 MG tablet Take 20 mg by mouth daily as needed.       . gemfibrozil (LOPID) 600 MG tablet Take 600 mg by mouth 2 (two) times daily before a meal.        . lisinopril (PRINIVIL,ZESTRIL) 40 MG tablet Take 40 mg by mouth daily.        . metoprolol (TOPROL-XL) 50 MG 24 hr tablet Take 75 mg by mouth daily.        . omeprazole (PRILOSEC) 20 MG capsule Take 20 mg by mouth daily.      . potassium chloride (KLOR-CON) 10 MEQ CR tablet Take 10 mEq by mouth 2 (two) times daily.         Allergies  Allergen Reactions  . Penicillins     Review of Systems negative except from HPI and PMH  Physical Exam BP 118/78  Pulse 69  Wt 205 lb 6.4 oz (93.169 kg) Well developed and well nourished in no acute distress HENT normal E scleral and icterus clear Neck Supple JVP flat; carotids brisk and full Clear to ausculation Regular rate and rhythm, no murmurs gallops or rub Soft with active bowel sounds No clubbing cyanosis none  Edema Alert and oriented, grossly normal motor and sensory function Skin Warm and Dry   Assessment and  Plan  

## 2011-12-01 NOTE — Progress Notes (Addendum)
Sling intact tp lt. arm

## 2011-12-01 NOTE — CV Procedure (Signed)
Preop Dx impending failure of atrial and ventricular leads and device-dependent patient Postoperative diagnosis-same Procedure explantation of the generator, contrast venogram, insertion of an atrial and ventricular lead, and reimplantation of the generator.  Following the obtaining informed consent the patient was brought to the electrophysiology laboratory in place to the fluoroscopic table in supine position after to prepped and draped and venography demonstrating patency of the extrathoracic left subclavian vein, an incision was made just caudal to the prior incision and carried down to the layer of the prepectoral fascia not violating the pacemaker pocket. 2 venipunctures were accomplished guidewires were placed and retained and sequentially 7 French sheath were placed which went a St. [name] 2088 TC active fixation ventricular lead serial number [id number] and an active-fixation atrial lead St. [name] 2088 TC serial number [id number].  Under fluoroscopic guidance easily manipulated the right ventricular apex and right atrial appendage respectively with a bipolar R wave was 15 with a pacing impedance of 980 a threshold 1.1 V at 0.4 ms current at threshold was 1.2 MA there is no diaphragmatic pacing at 10 V current of injury was brisk.  The bipolar P-wave was 3.5 pacing impedance was 529 threshold shortly after screw deployment was 1.8 0.4 only seconds current at threshold was approximately 3 and a current of injury was brisk there is no diaphragmatic pacing at 10 V.  I should note that the ventricular lead was marked with a tie prior to the insertion of the atrial lead and the leads were then attached back to the previously implanted ZEPHYE pulse generator serial number [id number] AV pacing was identified  The pocket was closely irrigated with antibiotic containing saline solution hemostasis was assured.  the previously implanted leads were capped and secured behind the pulse generator the pulse generator  was secured to the prepectoral fascia and the wound is then closed in 3 layers in the normal fashion. The wound was washed dried and a benzoin Steri-Strips was applied needle counts and sponge counts and instrument counts were correct at the end of the procedure according to the staff. The patient tolerated the procedure without apparent complication

## 2011-12-01 NOTE — Progress Notes (Signed)
Bleeding at the site of pacemaker. Cath lab [place]  notified and in to apply pressure dressing to site. Vss. Will continue to monitor the site.

## 2011-12-01 NOTE — Interval H&P Note (Signed)
History and Physical Interval Note:  12/01/2011 7:34 AM  Judith Flores  has presented today for surgery, with the diagnosis of complete heart block  The various methods of treatment have been discussed with the patient and family. After consideration of risks, benefits and other options for treatment, the patient has consented to  Procedure(s) (LRB): PACEMAKER REVISION (N/A) as a surgical intervention .  The patients' history has been reviewed, patient examined, no change in status, stable for surgery.  I have reviewed the patients' chart and labs.  Questions were answered to the patient's satisfaction.     Sherryl Manges

## 2011-12-01 NOTE — Progress Notes (Signed)
Dressing dry;intact to lt. Chest wall. No c/o

## 2011-12-02 ENCOUNTER — Ambulatory Visit (HOSPITAL_COMMUNITY): Payer: Medicare Other

## 2011-12-02 MED ORDER — ETODOLAC 400 MG PO TABS: 400.0000 mg | ORAL_TABLET | Freq: Every day | ORAL | Status: DC | PRN

## 2011-12-02 NOTE — Discharge Summary (Signed)
Status post revision of atrial and ventricular leads for impending failure

## 2011-12-02 NOTE — Discharge Summary (Signed)
CARDIOLOGY DISCHARGE SUMMARY   Patient ID: Judith Flores MRN: [id number] DOB/AGE: [birth year] 78 y.o.  Admit date: 12/01/2011 Discharge date: 12/02/2011  Primary Discharge Diagnosis:  impending failure of atrial and ventricular leads and device-dependent patient with CHB  Secondary Discharge Diagnosis:  Patient Active Problem List  Diagnoses  . SYNCOPE, HX OF  . Atrioventricular block, complete  . Pacemaker-St Judes  . polarity lead revision in both atrial and ventricular lead    Procedures:explantation of the generator, contrast venogram, insertion of an atrial and ventricular lead, and reimplantation of the generator.   Hospital Course: Judith Flores is a 78 year old female who is pacer-dependent in CHB. She needed pacemaker revision and came to the hospital for the procedure on 12/01/2011.   She had the procedures described above and tolerated them well. The next day, her CXR showed no acute abnormalities. She was doing well on Tylenol for pain and completed antibiotic therapy. Her site and device checks showed no problems. She is considered stable for discharge, to follow up as an outpatient.   Labs:  Lab Results  Component Value Date   WBC 6.4 11/24/2011   HGB 11.2* 11/24/2011   HCT 34.7* 11/24/2011   MCV 88.7 11/24/2011   PLT 159.0 11/24/2011      Radiology:  Dg Chest 2 View 12/02/2011   Clinical Data: Post pacemaker revision  CHEST - 2 VIEW  Comparison: 12/01/2011  Findings: Cardiomediastinal silhouette is stable.  No acute infiltrate or pleural effusion.  Minimal left basilar atelectasis or scarring.  Stable degenerative changes thoracic spine.  There is a four leads cardiac pacemaker leads left subclavian approach.  Two leads are noted in the right atrium and two leads are noted in right ventricle.  There is no diagnostic pneumothorax.  IMPRESSION: No active disease.  Four leads cardiac pacemaker in place.  Original Report Authenticated By: Natasha Mead, M.D.   Dg Chest 2  View 12/01/2011  *RADIOLOGY REPORT*  Clinical Data: Revision of pacemaker leads.  CHEST - 2 VIEW  Comparison: Chest radiograph performed 03/30/2011  Findings: The lungs are well-aerated.  Minimal left basilar scarring is noted.  There is no evidence of focal opacification, pleural effusion or pneumothorax.  The heart is borderline normal in size; a pacemaker is noted at the left chest wall, with leads ending at the right atrium and right ventricle.  No acute osseous abnormalities are seen.  Anterior bridging osteophytes are noted along the lower thoracic spine.  IMPRESSION: No acute cardiopulmonary process seen.  Original Report Authenticated By: Tonia Ghent, M.D.   FOLLOW UP PLANS AND APPOINTMENTS Discharge Orders    Future Appointments: Provider: Department: Dept Phone: Center:   12/12/2011 11:30 AM [facility] [place] [phone] [facility]   03/06/2012 10:45 AM [name], MD [facility] [phone] [facility]     Allergies  Allergen Reactions  . Sulfa Antibiotics Nausea And Vomiting  . Penicillins Rash   Medication List  As of 12/02/2011 10:21 AM   TAKE these medications         aspirin 81 MG tablet   Take 81 mg by mouth daily.      Calcium + D 600-200 MG-UNIT Tabs   Generic drug: Calcium Carbonate-Vitamin D   Take 1 tablet by mouth 2 (two) times daily.      ergocalciferol 50000 UNITS capsule   Commonly known as: VITAMIN D2   Take 50,000 Units by mouth once a week. On mondays  etodolac 400 MG tablet   Commonly known as: LODINE   Take 1 tablet (400 mg total) by mouth daily as needed (HOLD for 2 weeks). pain      furosemide 20 MG tablet   Commonly known as: LASIX   Take 20 mg by mouth daily as needed.      gemfibrozil 600 MG tablet   Commonly known as: LOPID   Take 600 mg by mouth daily before breakfast.      lisinopril 40 MG tablet   Commonly known as: PRINIVIL,ZESTRIL   Take 40 mg by mouth daily.      metoprolol succinate 50 MG  24 hr tablet   Commonly known as: TOPROL-XL   Take 75 mg by mouth daily.      omeprazole 20 MG capsule   Commonly known as: PRILOSEC   Take 20 mg by mouth daily as needed. Acid reflux      potassium chloride 10 MEQ CR tablet   Commonly known as: KLOR-CON   Take 10 mEq by mouth 2 (two) times daily.           Follow-up Information    Follow up with [facility]. (April 1 st at 11:30 am)    Contact information:   at [facility] [phone]      Follow up with Sherryl Manges, MD. (June 25th at 10:45)    Contact information:   [address] [place] [address] [phone]          BRING ALL MEDICATIONS WITH YOU TO FOLLOW UP APPOINTMENTS  Time spent with patient to include physician time: 35 min Signed: Theodore Demark 12/02/2011, 10:21 AM Co-Sign MD

## 2011-12-02 NOTE — Plan of Care (Signed)
Problem: Phase II Progression Outcomes Goal: Pacer site without bleeding/hematoma Outcome: Completed/Met Date Met:  12/02/11 No bleeding noted since pressure dressing applied Goal: Pain controlled Outcome: Progressing Tylenol given for site pain

## 2011-12-02 NOTE — Discharge Instructions (Signed)
     Supplemental Discharge Instructions for  Pacemaker/Defibrillator Patients  Activity No heavy lifting or vigorous activity with your left/right arm for 6 to 8 weeks.  Do not raise your left/right arm above your head for one week.  Gradually raise your affected arm as drawn below.               March 25th                   March 26th            March 27th           March 28th   NO DRIVING for  1 week     ; you may begin driving on   March 28 th       . WOUND CARE   Keep the wound area clean and dry.  Do not get this area wet for one week. No showers for one week; you may shower on     March 28 th         .   The tape/steri-strips on your wound will fall off; do not pull them off.  No bandage is needed on the site.  DO  NOT apply any creams, oils, or ointments to the wound area.   If you notice any drainage or discharge from the wound, any swelling or bruising at the site, or you develop a fever > 101? F after you are discharged home, call the office at once.  Special Instructions   You are still able to use cellular telephones; use the ear opposite the side where you have your pacemaker/defibrillator.  Avoid carrying your cellular phone near your device.   When traveling through airports, show security personnel your identification card to avoid being screened in the metal detectors.  Ask the security personnel to use the hand wand.   Avoid arc welding equipment, MRI testing (magnetic resonance imaging), TENS units (transcutaneous nerve stimulators).  Call the office for questions about other devices.   Avoid electrical appliances that are in poor condition or are not properly grounded.   Microwave ovens are safe to be near or to operate.  Additional information for defibrillator patients should your device go off:   If your device goes off ONCE and you feel fine afterward, notify the device clinic nurses.   If your device goes off ONCE and you do not feel well afterward, call 911.   If your device goes off TWICE, call 911.   If your device goes off THREE times in one day, call 911.  DO NOT DRIVE YOURSELF OR A FAMILY MEMBER WITH A DEFIBRILLATOR TO THE HOSPITAL--CALL 911.

## 2011-12-02 NOTE — Progress Notes (Signed)
  Patient Name: Judith Flores      SUBJECTIVE:a little shoulder soreness o/w w/o c/o of sob or chest pain  Past Medical History  Diagnosis Date  . Peripheral edema   . HTN (hypertension)   . Syncope   . Pacemaker   . Complete heart block     intermittent  . Atrial fibrillation   . Recurrent upper respiratory infection (URI)     HX OF CHRONIC BRONCHITIS  . Anemia   . GERD (gastroesophageal reflux disease)     PHYSICAL EXAM Filed Vitals:   12/01/11 1400 12/01/11 1559 12/01/11 2100 12/02/11 0500  BP: 160/71 119/65 151/62 127/71  Pulse: 70 70 70 68  Temp: 98 F (36.7 C)  98 F (36.7 C) 98.2 F (36.8 C)  TempSrc: Oral  Oral Oral  Resp: 18  20 18   Height:      Weight:      SpO2:   96% 96%   Well developed and nourished in no acute distress HENT normal Neck supple with JVP-flat Clear Wound without heamtoma Regular rate and rhythm, no murmurs or gallops Abd-soft with active BS No Clubbing cyanosis edema Skin-warm and dry A & Oriented  Grossly normal sensory and motor function   TELEMETRY: Reviewed telemetry pt in  P-synchronous/ AV  pacing :    Device Interrogation: stable    ASSESSMENT AND PLAN: S/p revision of pacing system for impending lead failure CHB -device dependence Fatigue, hypersomnolence ? OSA will ask PCP to consider slleep study  Signed, Sherryl Manges MD  12/02/2011

## 2011-12-02 NOTE — Progress Notes (Signed)
Reviewed discharge instructions with patient and family, they stated their understanding.  Also reviewed post pacemaker instructions with patient along with arm limitations.  Instructed patient and family on how to place left arm sling.  IV discontinued in right hand.  Skin tear to left hand with mepilex dressing.  Instructed patient to remove left chest dressing, left on by MD this am, tomorrow am.  Discharged via wheelchair by volunteers.  Colman Cater

## 2011-12-12 ENCOUNTER — Ambulatory Visit (INDEPENDENT_AMBULATORY_CARE_PROVIDER_SITE_OTHER): Payer: Medicare Other | Admitting: *Deleted

## 2011-12-12 ENCOUNTER — Encounter: Payer: Self-pay | Admitting: Internal Medicine

## 2011-12-12 DIAGNOSIS — I442 Atrioventricular block, complete: Secondary | ICD-10-CM

## 2011-12-12 LAB — PACEMAKER DEVICE OBSERVATION
AL AMPLITUDE: 3 mv
AL THRESHOLD: 0.75 V
BAMS-0001: 150 {beats}/min
DEVICE MODEL PM: 1235689
RV LEAD IMPEDENCE PM: 591 Ohm
RV LEAD THRESHOLD: 0.5 V

## 2011-12-12 NOTE — Progress Notes (Signed)
Wound check-PPM-lead revision

## 2012-02-16 ENCOUNTER — Encounter: Payer: Self-pay | Admitting: Cardiology

## 2012-02-16 ENCOUNTER — Encounter: Payer: Self-pay | Admitting: Internal Medicine

## 2012-02-16 ENCOUNTER — Ambulatory Visit (INDEPENDENT_AMBULATORY_CARE_PROVIDER_SITE_OTHER): Payer: Medicare Other | Admitting: Cardiology

## 2012-02-16 VITALS — BP 133/74 | HR 78 | Resp 18 | Ht 67.0 in | Wt 206.4 lb

## 2012-02-16 DIAGNOSIS — Z950 Presence of cardiac pacemaker: Secondary | ICD-10-CM

## 2012-02-16 DIAGNOSIS — I4891 Unspecified atrial fibrillation: Secondary | ICD-10-CM | POA: Insufficient documentation

## 2012-02-16 DIAGNOSIS — I10 Essential (primary) hypertension: Secondary | ICD-10-CM

## 2012-02-16 DIAGNOSIS — I442 Atrioventricular block, complete: Secondary | ICD-10-CM

## 2012-02-16 DIAGNOSIS — T82190A Other mechanical complication of cardiac electrode, initial encounter: Secondary | ICD-10-CM

## 2012-02-16 HISTORY — DX: Essential (primary) hypertension: I10

## 2012-02-16 HISTORY — DX: Unspecified atrial fibrillation: I48.91

## 2012-02-16 LAB — PACEMAKER DEVICE OBSERVATION
AL AMPLITUDE: 1.5 mv
ATRIAL PACING PM: 45
BAMS-0003: 70 {beats}/min
BATTERY VOLTAGE: 2.8 V
RV LEAD IMPEDENCE PM: 604 Ohm
VENTRICULAR PACING PM: 99

## 2012-02-16 MED ORDER — RIVAROXABAN 20 MG PO TABS: 20.0000 mg | ORAL_TABLET | Freq: Every day | ORAL | Status: DC

## 2012-02-16 NOTE — Progress Notes (Signed)
ELECTROPHYSIOLOGY OFFICE NOTE  Patient ID: Judith Flores MRN: [id number], DOB/AGE: Jun 28, 1934   Date of Visit: 02/16/2012  Primary Physician: Lois Huxley, MD Primary Cardiologist: Sherryl Manges, MD Reason for Visit: Device follow-up  History of Present Illness Judith Flores is a pleasant 78 year old woman with history of CHB s/p PPM who underwent RA and RV lead revision/new lead implantation in March 2013 who presents today for device follow-up. She reports she is feeling well and has no complaints. She denies chest pain, shortness of breath, palpitations, dizziness, near-syncope or syncope. She reports her implant site has healed well. Of note, she is PPM dependent.   Past Medical History  Diagnosis Date  . Peripheral edema   . HTN (hypertension)   . Syncope   . Pacemaker   . Complete heart block     intermittent  . Atrial fibrillation   . Recurrent upper respiratory infection (URI)     HX OF CHRONIC BRONCHITIS  . Anemia   . GERD (gastroesophageal reflux disease)     Past Surgical History  Procedure Date  . Insert / replace / remove pacemaker     St [name]  . Breast tumor     Allergies/Intolerances Allergies  Allergen Reactions  . Sulfa Antibiotics Nausea And Vomiting  . Penicillins Rash   Current Home Medications Current Outpatient Prescriptions  Medication Sig Dispense Refill  . Calcium Carbonate-Vitamin D (CALCIUM + D) 600-200 MG-UNIT TABS Take 1 tablet by mouth 2 (two) times daily.       . ergocalciferol (VITAMIN D2) 50000 UNITS capsule Take 50,000 Units by mouth once a week. On mondays      . furosemide (LASIX) 20 MG tablet Take 20 mg by mouth daily as needed.       [name] gemfibrozil (LOPID) 600 MG tablet Take 600 mg by mouth daily before breakfast.       . lisinopril (PRINIVIL,ZESTRIL) 40 MG tablet Take 40 mg by mouth daily.        . metoprolol (TOPROL-XL) 50 MG 24 hr tablet Take 75 mg by mouth daily.       [name] omeprazole (PRILOSEC) 20 MG capsule Take 20 mg by mouth daily  as needed. Acid reflux      . potassium chloride (KLOR-CON) 10 MEQ CR tablet Take 10 mEq by mouth 2 (two) times daily.        Social History Social History  . Marital Status: Widowed    Spouse Name: N/A    Number of Children: N/A  . Years of Education: N/A   Social History Main Topics  . Smoking status: Never Smoker   . Smokeless tobacco: Never Used  . Alcohol Use: No  . Drug Use: No  . Sexually Active: No   Review of Systems General:  No chills, fever, night sweats or weight changes Cardiovascular:  No chest pain, dyspnea on exertion, edema, orthopnea, palpitations, paroxysmal nocturnal dyspnea Dermatological: No rash, lesions or masses Respiratory: No cough, dyspnea Urologic: No hematuria, dysuria Abdominal:   No nausea, vomiting, diarrhea, bright red blood per rectum, melena, or hematemesis Neurologic:  No visual changes, weakness, changes in mental status All other systems reviewed and are otherwise negative except as noted above.  Physical Exam Blood pressure 133/74, pulse 78, resp. rate 18, height 5\' 7"  (1.702 m), weight 206 lb 6.4 oz (93.622 kg).  General: Well developed, well appearing 78 year old female, in no acute distress. HEENT: Normocephalic, atraumatic. EOMs intact. Sclera nonicteric. Oropharynx clear.  Neck: Supple  without bruits. No JVD. Lungs:  Respirations regular and unlabored, CTA bilaterally. No wheezes, rales or rhonchi. Heart: RRR. S1, S2 present. No murmurs, rub, S3 or S4. Abdomen: Soft, non-distended.  Extremities: No clubbing, cyanosis or edema. DP/PT/Radials 2+ and equal bilaterally. Psych: Normal affect. Neuro: Alert and oriented X 3. Moves all extremities spontaneously.   Diagnostics Device interrogation today shows normal PPM function with good battery status and stable lead parameters/measurements; presenting rhythm AV paced; there are 103 mode switch episodes (2.7% of the time) with the longest being 8 hours; the atrial pacing parameters were  changed to chronic settings/output, changed from 3.5V to 2.0V; see PaceArt report   Assessment and Plan 1.  Atrial fibrillation - paroxysmal, asymptomatic; will continue beta blocker for rate control; discussed the indications for chronic anticoagulation to reduce risk of stroke as her CHADS2VASc score is 4 indicating she is at high risk for cardioembolic event, approximately 4% per year; her calculated CrCl is 62.3; she will start Xarelto 20 mg PO daily; will repeat BMP in 2 weeks to document her renal function is stable; she was counseled regarding the risk of bleeding and she was instructed to stop aspirin; she will see Dr. Graciela Husbands in 3 months 2.  CHB s/p PPM - normal device function; continue device clinic follow-up every 3 months  This plan of care was formulated with Dr. Graciela Husbands who was in to see the patient with me. Signed, Rick Duff, PA-C 02/16/2012, 11:09 AM

## 2012-02-16 NOTE — Patient Instructions (Addendum)
Your physician wants you to follow-up in: March of 2013 with Dr Graciela Husbands.  You will receive a reminder letter in the mail two months in advance. If you don't receive a letter, please call our office to schedule the follow-up appointment.  Your physician has recommended you make the following change in your medication: Start Xarelto 20mg  1 tablet daily.  Stop Aspirin.   Your physician recommends that you return for lab work in: 2 weeks (BMET) to check kidney function.

## 2012-02-22 ENCOUNTER — Telehealth: Payer: Self-pay | Admitting: Internal Medicine

## 2012-02-22 NOTE — Telephone Encounter (Signed)
Received call from [facility] Pharmacist [phone]  Patient needs prior authorization for Xarelto, please return this call to Medicare Part D [phone].

## 2012-02-23 NOTE — Telephone Encounter (Signed)
Confirmation this was approved was received from [employer] on 02/22/12. Good through 02/21/13.

## 2012-03-06 ENCOUNTER — Encounter: Payer: Medicare Other | Admitting: Internal Medicine

## 2012-03-20 ENCOUNTER — Telehealth: Payer: Self-pay | Admitting: Internal Medicine

## 2012-03-20 NOTE — Telephone Encounter (Addendum)
Pt was to have blood work done , needs order faxed to [facility] in [place] dr Earlene Plater , pls call when done [phone] ok to leave a message

## 2012-03-20 NOTE — Telephone Encounter (Signed)
I left a message for the patient at her home # and # listed in this message that her lab order has been faxed (BMP) to [facility] [phone].

## 2012-03-20 NOTE — Telephone Encounter (Signed)
Fu msg Dr Earlene Plater office number [phone] and fax number [phone]

## 2012-03-20 NOTE — Telephone Encounter (Signed)
F/U  Patient calling back to see regarding blood work to be done. Dr. [facility] in [place] office

## 2012-03-21 ENCOUNTER — Encounter: Payer: Self-pay | Admitting: Internal Medicine

## 2012-05-23 DIAGNOSIS — I442 Atrioventricular block, complete: Secondary | ICD-10-CM

## 2012-08-24 ENCOUNTER — Encounter: Payer: Self-pay | Admitting: Internal Medicine

## 2012-08-24 DIAGNOSIS — I442 Atrioventricular block, complete: Secondary | ICD-10-CM

## 2012-12-11 ENCOUNTER — Encounter: Payer: Medicare Other | Admitting: Internal Medicine

## 2013-01-24 ENCOUNTER — Encounter: Payer: Medicare Other | Admitting: Internal Medicine

## 2013-02-22 ENCOUNTER — Encounter: Payer: Medicare Other | Admitting: Internal Medicine

## 2013-02-22 IMAGING — CR DG CHEST 2V
2 series · 2 of 2 positions shown · non-contrast
Comparison: Chest radiograph performed 03/30/2011

CLINICAL DATA: Revision of pacemaker leads.

CHEST - 2 VIEW

[view not recorded (1 of 2)]
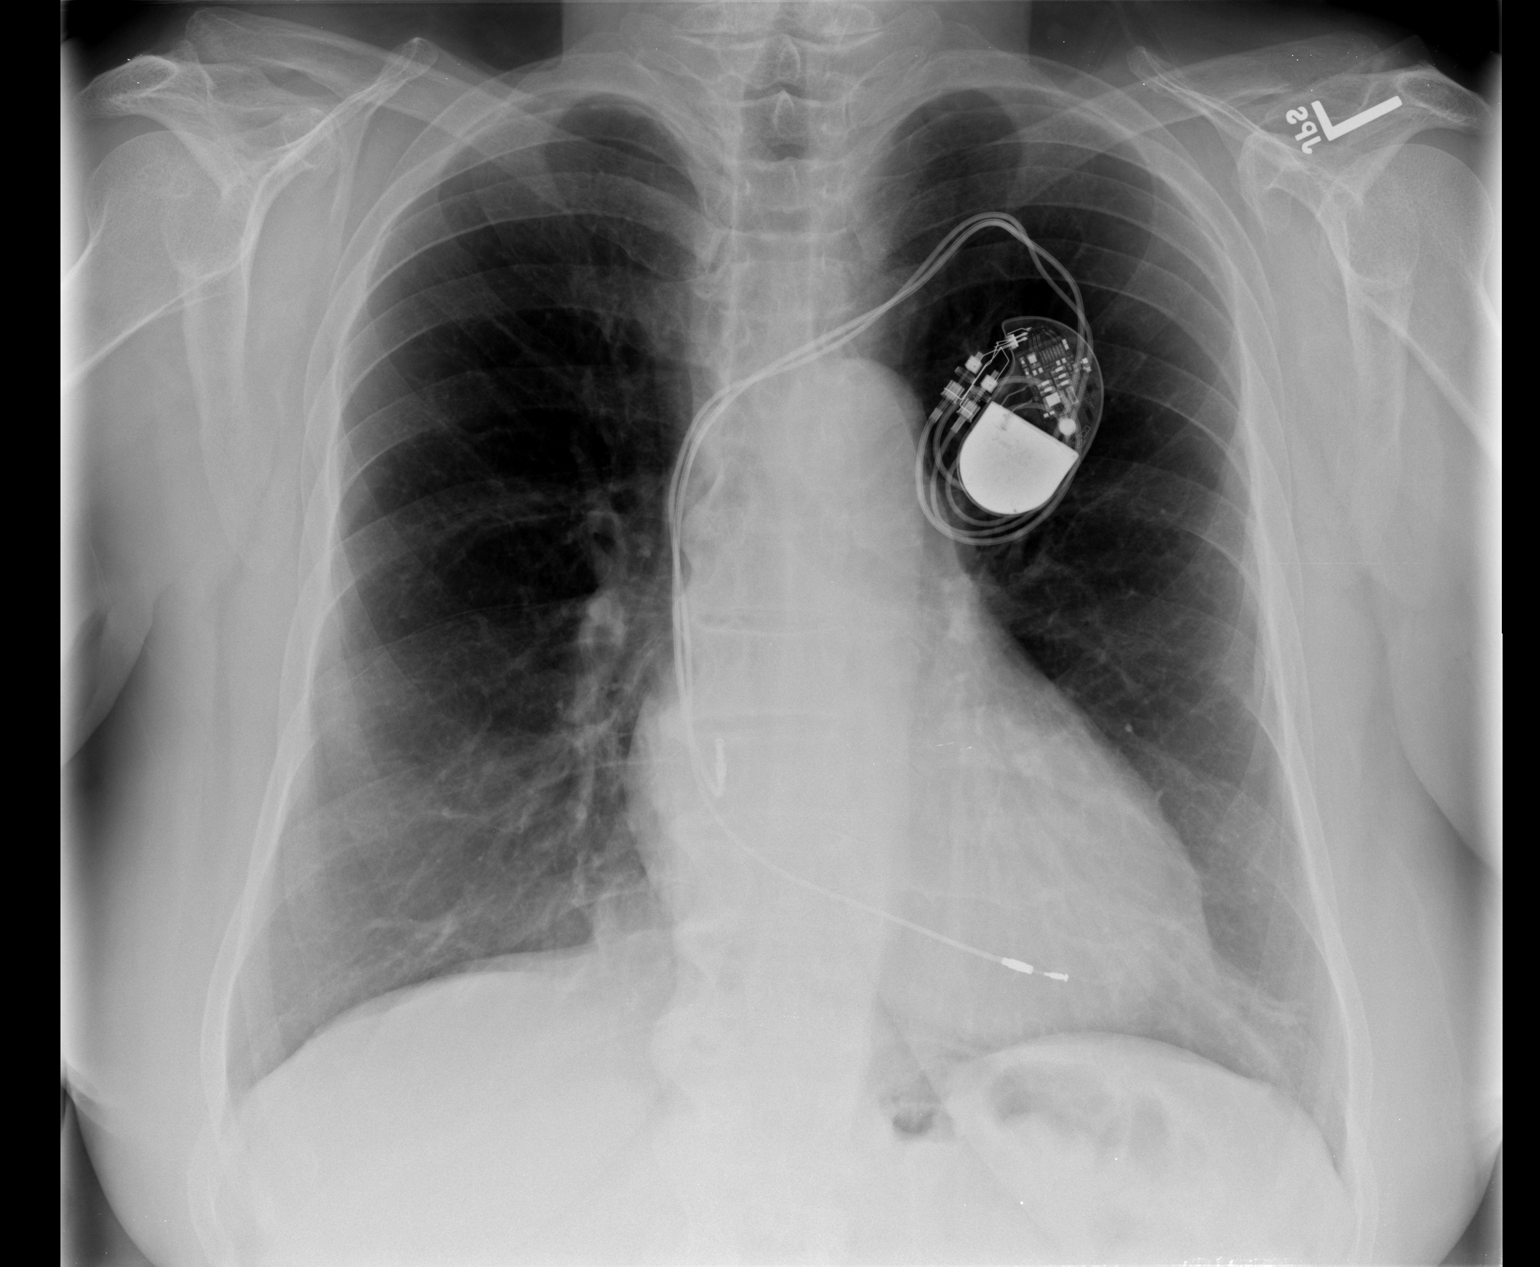

[view not recorded (2 of 2)]
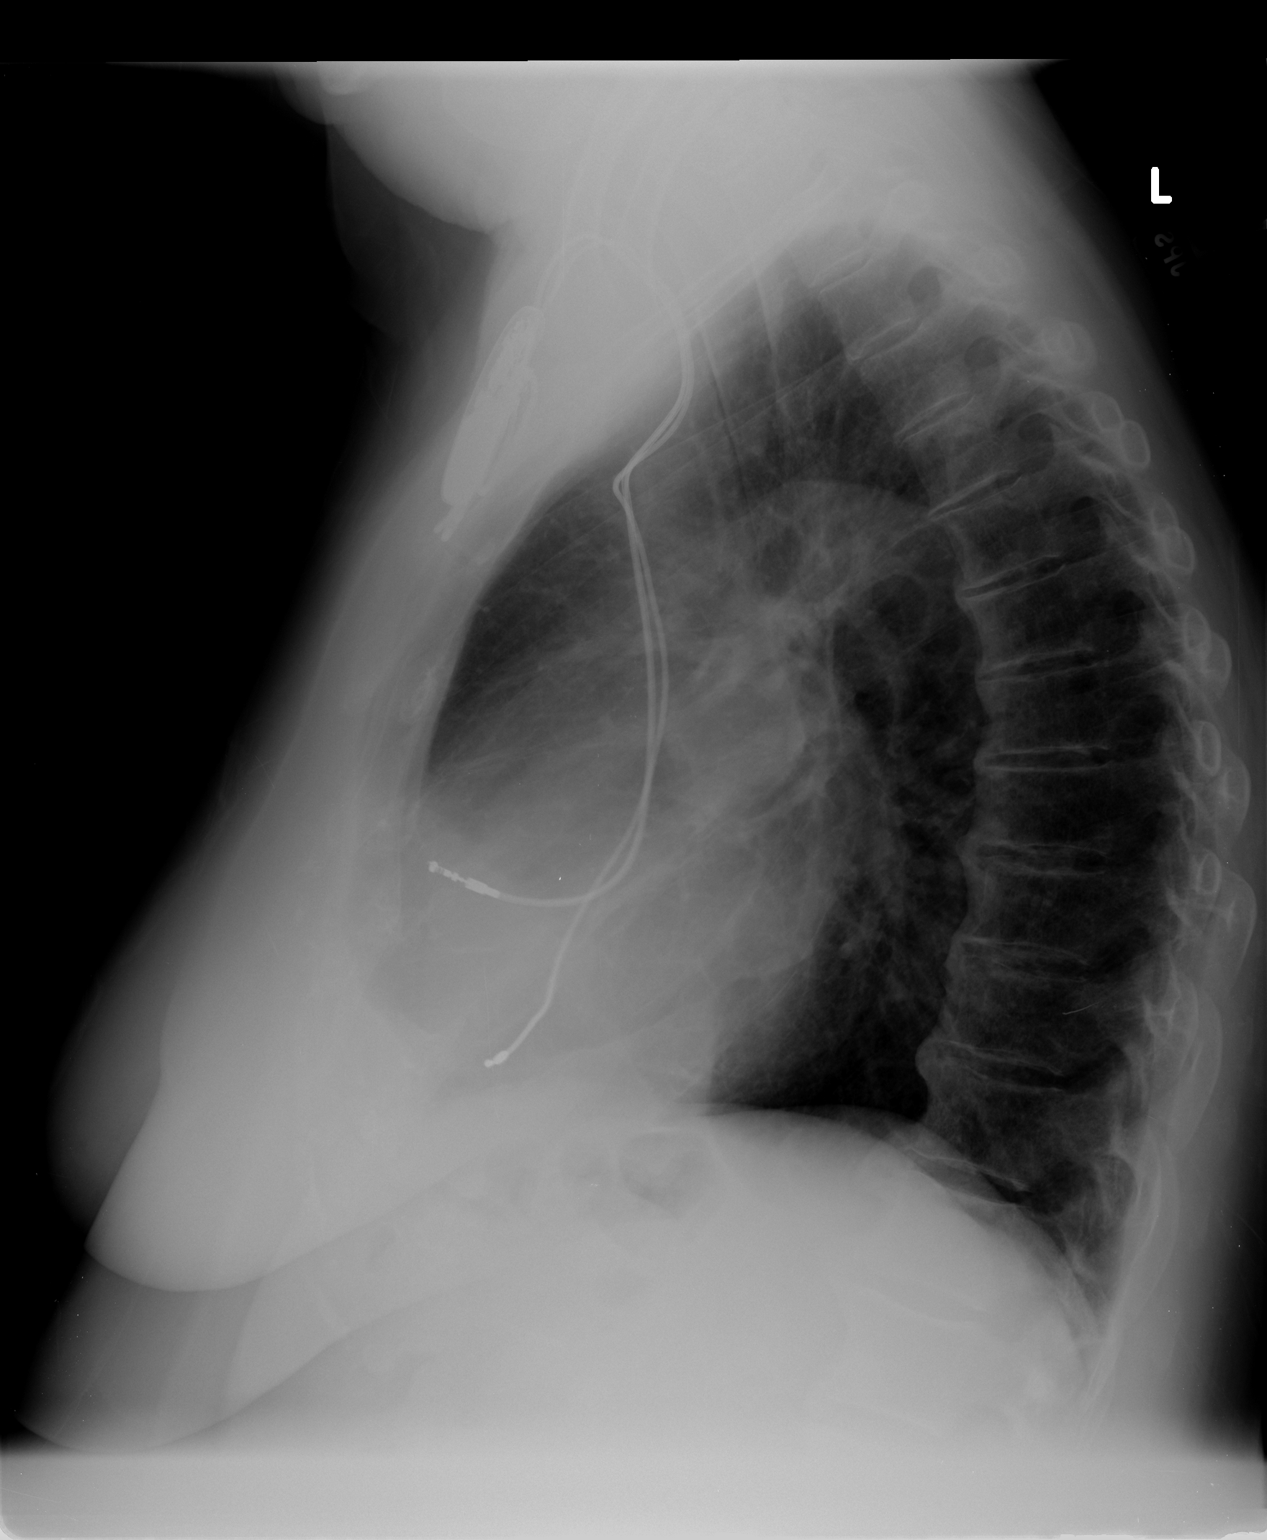

[2 of 2 positions shown; findings below may reference images not displayed]

FINDINGS: The lungs are well-aerated.  Minimal left basilar
scarring is noted.  There is no evidence of focal opacification,
pleural effusion or pneumothorax.

The heart is borderline normal in size; a pacemaker is noted at the
left chest wall, with leads ending at the right atrium and right
ventricle.  No acute osseous abnormalities are seen.  Anterior
bridging osteophytes are noted along the lower thoracic spine.
IMPRESSION: No acute cardiopulmonary process seen.

## 2013-02-23 IMAGING — CR DG CHEST 2V
2 series · 2 of 2 positions shown · non-contrast
Comparison: 12/01/2011

CLINICAL DATA: Post pacemaker revision

CHEST - 2 VIEW

[w chest pa]
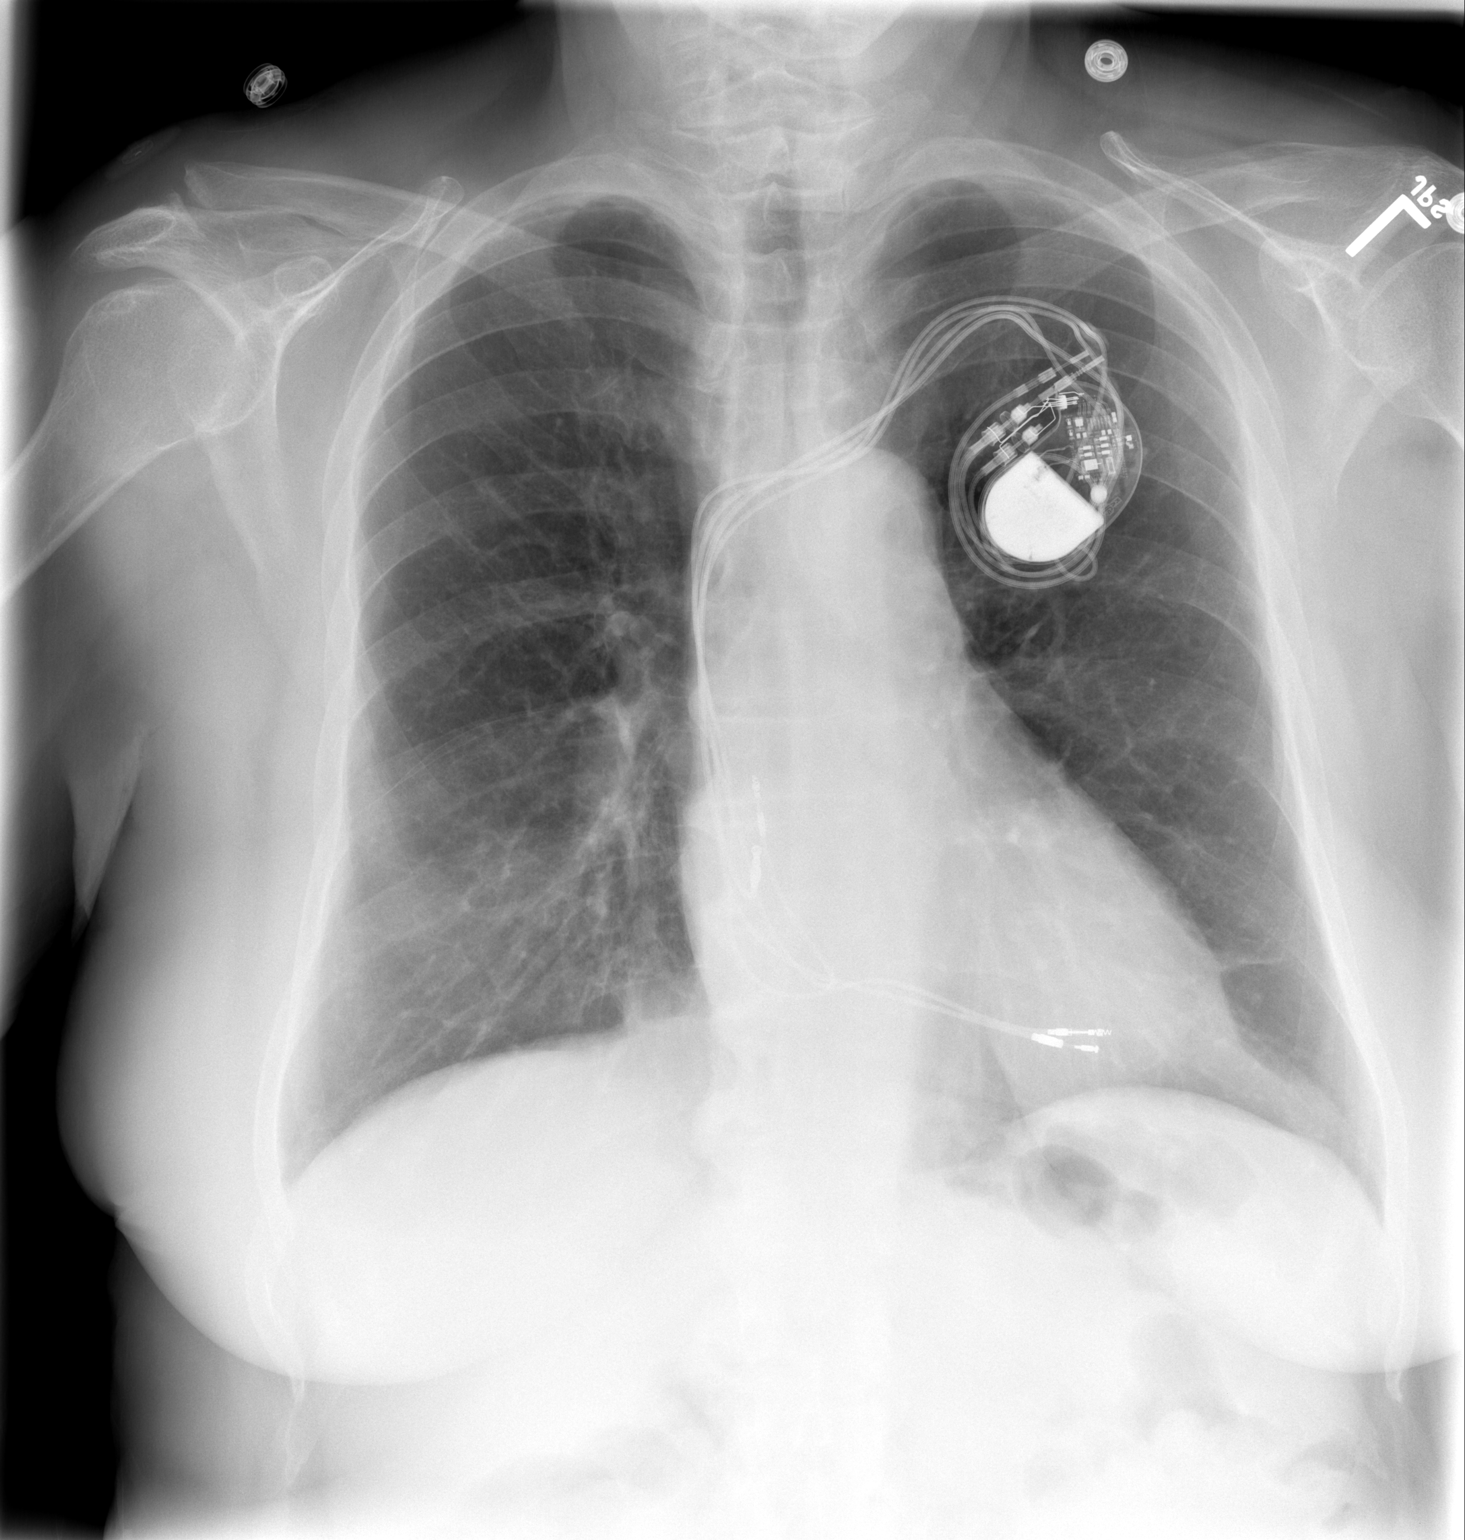

[w chest lat]
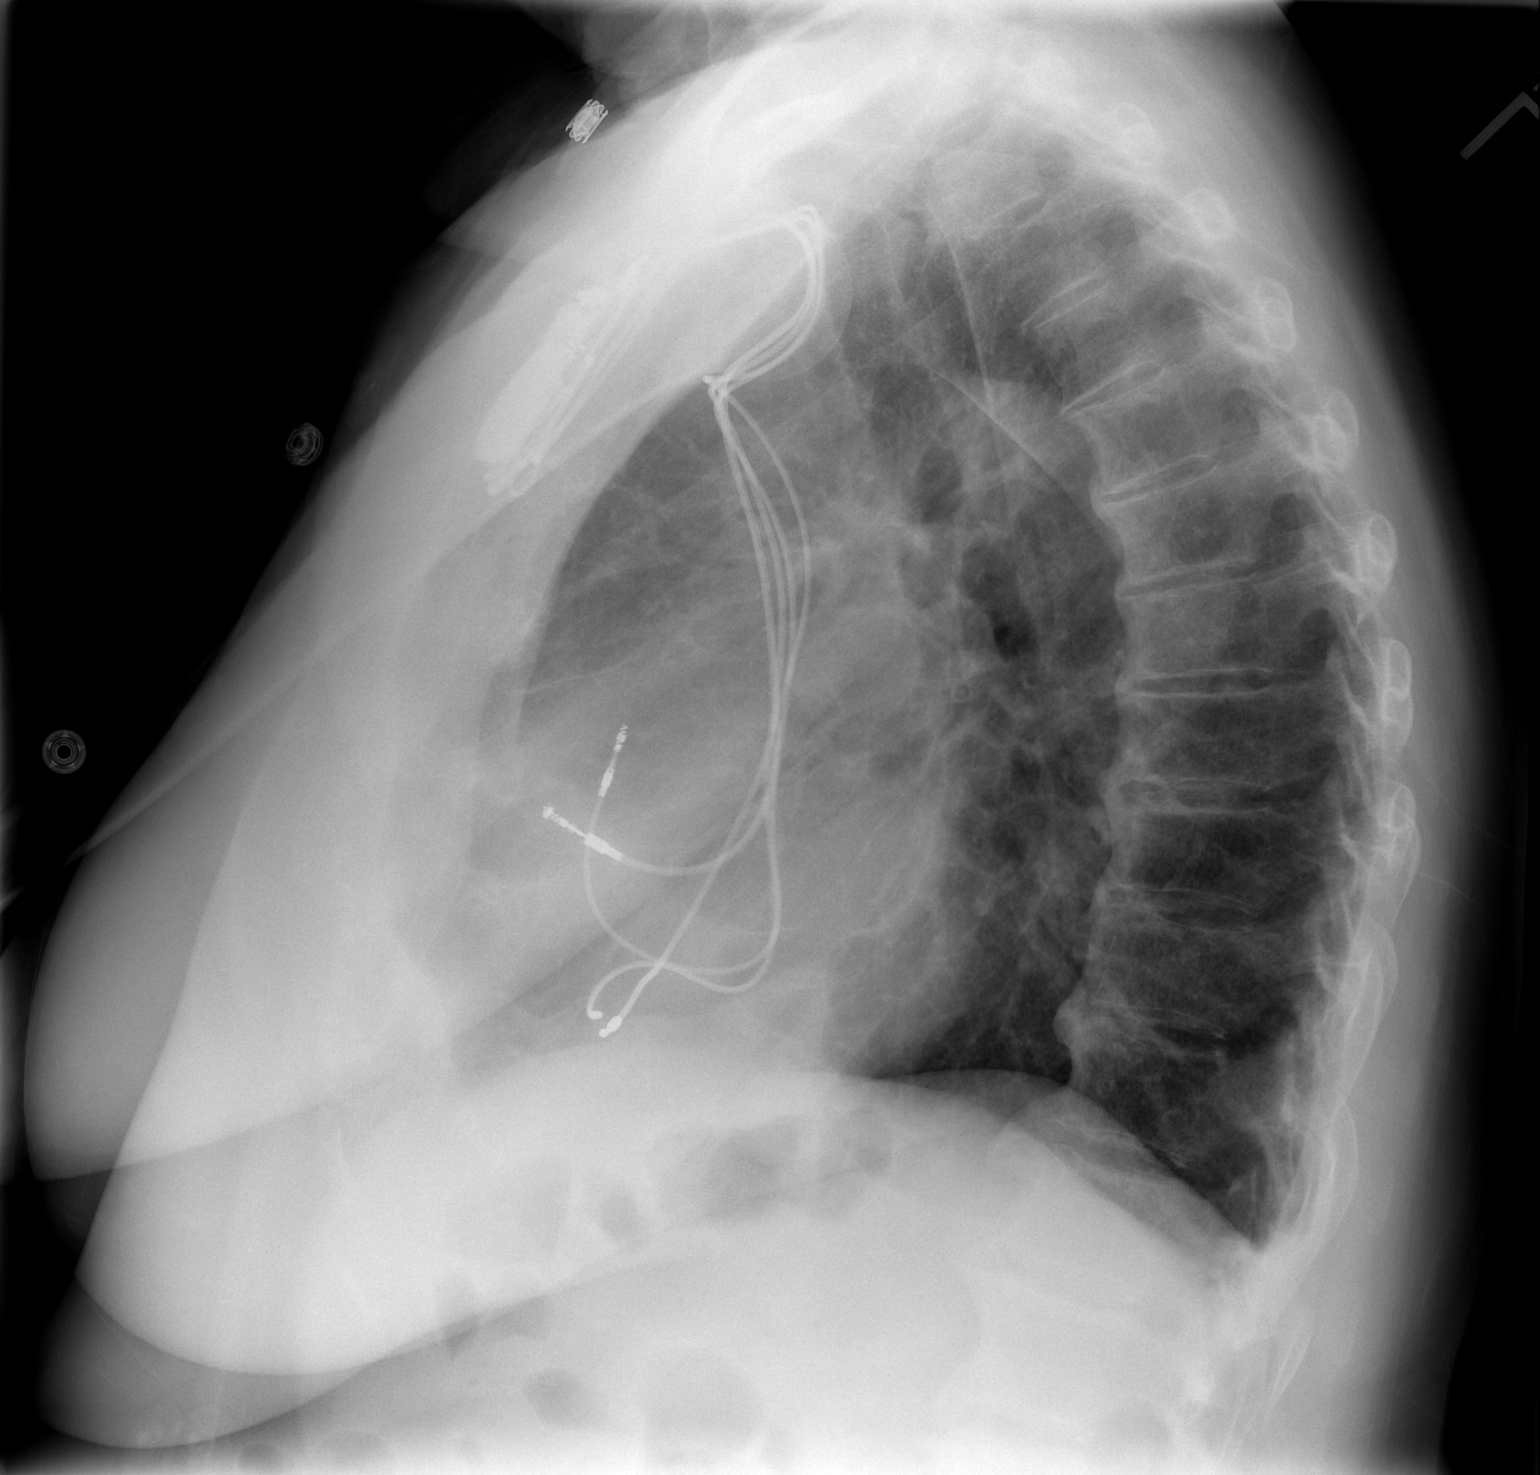

[2 of 2 positions shown; findings below may reference images not displayed]

FINDINGS: Cardiomediastinal silhouette is stable.  No acute
infiltrate or pleural effusion.  Minimal left basilar atelectasis
or scarring.  Stable degenerative changes thoracic spine.  There is
a four leads cardiac pacemaker leads left subclavian approach.  Two
leads are noted in the right atrium and two leads are noted in
right ventricle.  There is no diagnostic pneumothorax.
IMPRESSION: No active disease.  Four leads cardiac pacemaker in place.

## 2013-03-12 ENCOUNTER — Encounter: Payer: Medicare Other | Admitting: Internal Medicine

## 2013-03-26 ENCOUNTER — Ambulatory Visit (INDEPENDENT_AMBULATORY_CARE_PROVIDER_SITE_OTHER): Payer: Medicare Other | Admitting: Internal Medicine

## 2013-03-26 ENCOUNTER — Encounter: Payer: Self-pay | Admitting: Internal Medicine

## 2013-03-26 VITALS — BP 173/69 | HR 70 | Ht 67.0 in | Wt 201.0 lb

## 2013-03-26 DIAGNOSIS — I4891 Unspecified atrial fibrillation: Secondary | ICD-10-CM

## 2013-03-26 DIAGNOSIS — I442 Atrioventricular block, complete: Secondary | ICD-10-CM

## 2013-03-26 DIAGNOSIS — R55 Syncope and collapse: Secondary | ICD-10-CM

## 2013-03-26 LAB — PACEMAKER DEVICE OBSERVATION
AL THRESHOLD: 1 V
BAMS-0001: 150 {beats}/min
BAMS-0003: 70 {beats}/min
BATTERY VOLTAGE: 2.78 V
DEVICE MODEL PM: 1235689
VENTRICULAR PACING PM: 100

## 2013-03-26 MED ORDER — HYDROCHLOROTHIAZIDE 25 MG PO TABS: ORAL_TABLET | ORAL | Status: DC

## 2013-03-26 MED ORDER — METOPROLOL TARTRATE 50 MG PO TABS: 50.0000 mg | ORAL_TABLET | Freq: Two times a day (BID) | ORAL | Status: DC

## 2013-03-26 MED ORDER — HYDROCHLOROTHIAZIDE 25 MG PO TABS: 25.0000 mg | ORAL_TABLET | Freq: Every day | ORAL | Status: DC

## 2013-03-26 NOTE — Assessment & Plan Note (Signed)
Infrequent atrial fibrillation.she is on Rivaroxaban.    GFR 7/13 was about 55

## 2013-03-26 NOTE — Progress Notes (Signed)
Patient Care Team: [name] as PCP - General (Family Medicine)   HPI  Judith Flores is a 79 y.o. female seen in followup for syncope and intermittent complete heart block. She continues to do well. She's had no positive lightheadedness or exercise intolerance.  She notes that a few months ago she noted thumping with her pacemaker In various positionswhich was assoc with polarity switch and because of device dependent she underwent system revision 3/13  Blood pressure has been poorly controlled  Past Medical History  Diagnosis Date  . Peripheral edema   . HTN (hypertension)   . Syncope   . Pacemaker   . Complete heart block     intermittent  . Atrial fibrillation   . Recurrent upper respiratory infection (URI)     HX OF CHRONIC BRONCHITIS  . Anemia   . GERD (gastroesophageal reflux disease)     Past Surgical History  Procedure Laterality Date  . Insert / replace / remove pacemaker      [employer] [name]  . Breast tumor      Current Outpatient Prescriptions  Medication Sig Dispense Refill  . Calcium Carbonate-Vitamin D (CALCIUM + D) 600-200 MG-UNIT TABS Take 1 tablet by mouth 2 (two) times daily.       . ergocalciferol (VITAMIN D2) 50000 UNITS capsule Take 50,000 Units by mouth once a week. On mondays      . furosemide (LASIX) 20 MG tablet Take 20 mg by mouth daily as needed.       [name] gemfibrozil (LOPID) 600 MG tablet Take 600 mg by mouth daily before breakfast.       . lisinopril (PRINIVIL,ZESTRIL) 40 MG tablet Take 40 mg by mouth daily.        . metoprolol (TOPROL-XL) 50 MG 24 hr tablet Take 75 mg by mouth daily.       [name] omeprazole (PRILOSEC) 20 MG capsule Take 20 mg by mouth daily as needed. Acid reflux      . potassium chloride (KLOR-CON) 10 MEQ CR tablet Take 10 mEq by mouth 2 (two) times daily.       . Rivaroxaban (XARELTO) 20 MG TABS Take 20 mg by mouth daily.  30 tablet  6  . [DISCONTINUED] cholestyramine (QUESTRAN) 4 G packet Take 1 packet by mouth 2 (two) times daily  with a meal.        . [DISCONTINUED] fluticasone (FLONASE) 50 MCG/ACT nasal spray 2 sprays by Nasal route daily.         No current facility-administered medications for this visit.    Allergies  Allergen Reactions  . Sulfa Antibiotics Nausea And Vomiting  . Penicillins Rash    Review of Systems negative except from HPI and PMH  Physical Exam BP 173/69  Pulse 70  Ht 5\' 7"  (1.702 m)  Wt 201 lb (91.173 kg)  BMI 31.47 kg/m2 Well developed and well nourished in no acute distress HENT normal E scleral and icterus clear Neck Supple JVP flat; carotids brisk and full Clear to ausculation Device pocket well healed; without hematoma or erythema Regular rate and rhythm, no murmurs gallops or rub Soft with active bowel sounds No clubbing cyanosis none Edema Alert and oriented, grossly normal motor and sensory function Skin Warm and Dry   ECG demonstrates of dual-chamber pacing Assessment and  Plan

## 2013-03-26 NOTE — Assessment & Plan Note (Signed)
Poorly controlled. She's also spending a lot of money for metoprolol succinate. We'll change his metoprolol tartrate. We'll also start her lisinopril HCT although with some trepidation she had hypokalemia at baseline. We'll check a renin/aldosterone ratio and she is to see her PCP in 2 weeks time at which time the potassium level rechecked

## 2013-03-26 NOTE — Assessment & Plan Note (Signed)
The patient's device was interrogated.  The information was reviewed. No changes were made in the programming.    

## 2013-03-26 NOTE — Patient Instructions (Signed)
Your physician has recommended you make the following change in your medication:  1) Make sure you are taking lisinopril 40 mg once daily. 2) Start hydrochlorothiazide (HCTZ) 25 mg 1/2 tablet by mouth once daily. 3) Stop metoprolol succinate. 4) Start metoprolol tartrate 50 mg one tablet by mouth twice daily.  Your physician recommends that you have lab work today: renin/aldosterone ratio  Your physician wants you to follow-up in: 1 year with Dr. Graciela Husbands. You will receive a reminder letter in the mail two months in advance. If you don't receive a letter, please call our office to schedule the follow-up appointment.  Please have your primary care doctor fax your labwork to Dr. Graciela Husbands @ [phone].

## 2013-03-26 NOTE — Assessment & Plan Note (Signed)
Stable post pacing 

## 2013-03-29 ENCOUNTER — Encounter: Payer: Self-pay | Admitting: Internal Medicine

## 2013-04-03 LAB — ALDOSTERONE + RENIN ACTIVITY W/ RATIO: PRA LC/MS/MS: 1.1 ng/mL/h (ref 0.25–5.82)

## 2013-04-16 ENCOUNTER — Telehealth: Payer: Self-pay | Admitting: Internal Medicine

## 2013-04-16 NOTE — Telephone Encounter (Signed)
Spoke with patient.

## 2013-04-16 NOTE — Telephone Encounter (Signed)
Follow up ° °Pt states she is returning your call.  °

## 2013-06-26 DIAGNOSIS — I442 Atrioventricular block, complete: Secondary | ICD-10-CM

## 2013-07-16 ENCOUNTER — Encounter: Payer: Self-pay | Admitting: Internal Medicine

## 2013-10-02 ENCOUNTER — Encounter: Payer: Self-pay | Admitting: Internal Medicine

## 2013-10-02 DIAGNOSIS — I442 Atrioventricular block, complete: Secondary | ICD-10-CM

## 2013-11-13 DIAGNOSIS — Z1331 Encounter for screening for depression: Secondary | ICD-10-CM | POA: Insufficient documentation

## 2013-11-13 DIAGNOSIS — Z0000 Encounter for general adult medical examination without abnormal findings: Secondary | ICD-10-CM | POA: Insufficient documentation

## 2013-11-13 HISTORY — DX: Encounter for screening for depression: Z13.31

## 2014-01-01 DIAGNOSIS — I442 Atrioventricular block, complete: Secondary | ICD-10-CM

## 2014-02-21 ENCOUNTER — Encounter: Payer: Self-pay | Admitting: Internal Medicine

## 2014-03-27 DIAGNOSIS — K219 Gastro-esophageal reflux disease without esophagitis: Secondary | ICD-10-CM | POA: Insufficient documentation

## 2014-03-27 DIAGNOSIS — M549 Dorsalgia, unspecified: Secondary | ICD-10-CM

## 2014-03-27 HISTORY — DX: Dorsalgia, unspecified: M54.9

## 2014-03-27 NOTE — Telephone Encounter (Signed)
Close encounter 

## 2014-04-02 ENCOUNTER — Encounter: Payer: Medicare Other | Admitting: Internal Medicine

## 2014-04-03 DIAGNOSIS — I442 Atrioventricular block, complete: Secondary | ICD-10-CM

## 2014-05-07 ENCOUNTER — Ambulatory Visit (INDEPENDENT_AMBULATORY_CARE_PROVIDER_SITE_OTHER): Payer: Medicare Other | Admitting: Internal Medicine

## 2014-05-07 ENCOUNTER — Encounter: Payer: Self-pay | Admitting: Internal Medicine

## 2014-05-07 VITALS — BP 148/60 | HR 70 | Ht 67.0 in | Wt 209.2 lb

## 2014-05-07 DIAGNOSIS — Z950 Presence of cardiac pacemaker: Secondary | ICD-10-CM

## 2014-05-07 DIAGNOSIS — I442 Atrioventricular block, complete: Secondary | ICD-10-CM

## 2014-05-07 DIAGNOSIS — I480 Paroxysmal atrial fibrillation: Secondary | ICD-10-CM

## 2014-05-07 DIAGNOSIS — I4891 Unspecified atrial fibrillation: Secondary | ICD-10-CM

## 2014-05-07 LAB — MDC_IDC_ENUM_SESS_TYPE_INCLINIC
Date Time Interrogation Session: 20150826095956
Lead Channel Impedance Value: 409 Ohm
Lead Channel Impedance Value: 631 Ohm
Lead Channel Pacing Threshold Amplitude: 0.5 V
Lead Channel Pacing Threshold Amplitude: 0.75 V
Lead Channel Pacing Threshold Pulse Width: 0.4 ms
Lead Channel Sensing Intrinsic Amplitude: 1.8 mV
Lead Channel Setting Sensing Sensitivity: 6 mV
MDC IDC MSMT BATTERY IMPEDANCE: 1800 Ohm
MDC IDC MSMT BATTERY VOLTAGE: 2.79 V
MDC IDC MSMT LEADCHNL RV PACING THRESHOLD PULSEWIDTH: 0.4 ms
MDC IDC PG SERIAL: 1235689
MDC IDC SET LEADCHNL RA PACING AMPLITUDE: 2 V
MDC IDC SET LEADCHNL RV PACING PULSEWIDTH: 0.4 ms

## 2014-05-07 MED ORDER — FERROUS SULFATE 325 (65 FE) MG PO TBEC: 325.0000 mg | DELAYED_RELEASE_TABLET | Freq: Three times a day (TID) | ORAL | Status: DC

## 2014-05-07 NOTE — Progress Notes (Signed)
Patient Care Team: [name], MD as PCP - General (Family Medicine)   HPI  Judith Flores is a 80 y.o. female seen in followup for syncope and intermittent complete heart block. She is s/p pacemaker implant   She continues to do well. She's had no positive lightheadedness or exercise intolerance.  She notes that a few months ago she noted thumping with her pacemaker In various positionswhich was assoc with polarity switch and because of device dependent she underwent system revision 3/13  Blood pressure has been poorly controlled recently losartan   Has had problems with peripheral edema which she takes prn diuretics in addition to her daily HCTZ     Past Medical History  Diagnosis Date  . Peripheral edema   . HTN (hypertension)   . Syncope   . Pacemaker   . Complete heart block     intermittent  . Atrial fibrillation   . Recurrent upper respiratory infection (URI)     HX OF CHRONIC BRONCHITIS  . Anemia   . GERD (gastroesophageal reflux disease)     Past Surgical History  Procedure Laterality Date  . Insert / replace / remove pacemaker      [employer]  . Breast tumor      Current Outpatient Prescriptions  Medication Sig Dispense Refill  . Calcium Carbonate-Vitamin D (CALCIUM + D) 600-200 MG-UNIT TABS Take 1 tablet by mouth 2 (two) times daily.       . ergocalciferol (VITAMIN D2) 50000 UNITS capsule Take 50,000 Units by mouth once a week. On mondays      . furosemide (LASIX) 20 MG tablet Take 20 mg by mouth daily as needed.       [name] gemfibrozil (LOPID) 600 MG tablet Take 600 mg by mouth daily before breakfast.       . hydrochlorothiazide (HYDRODIURIL) 25 MG tablet Take 1/2 tablet by mouth once daily  30 tablet  6  . losartan (COZAAR) 100 MG tablet Take 100 mg by mouth daily.      . metoprolol (LOPRESSOR) 50 MG tablet Take 1 tablet (50 mg total) by mouth 2 (two) times daily.  60 tablet  11  . potassium chloride (KLOR-CON) 10 MEQ CR tablet Take 10 mEq by mouth  2 (two) times daily.       . Rivaroxaban (XARELTO) 20 MG TABS Take 20 mg by mouth daily.  30 tablet  6  . [DISCONTINUED] cholestyramine (QUESTRAN) 4 G packet Take 1 packet by mouth 2 (two) times daily with a meal.        . [DISCONTINUED] fluticasone (FLONASE) 50 MCG/ACT nasal spray 2 sprays by Nasal route daily.         No current facility-administered medications for this visit.    Allergies  Allergen Reactions  . Sulfa Antibiotics Nausea And Vomiting  . Penicillins Rash    Review of Systems negative except from HPI and PMH  Physical Exam BP 148/60  Pulse 70  Ht  (1.702 m)  Wt 209 lb 3.2 oz (94.892 kg)  BMI 32.76 kg/m2 Well developed and well nourished in no acute distress HENT normal E scleral and icterus clear Neck Supple JVP flat; carotids brisk and full Clear to ausculation  Regular rate and rhythm, no murmurs gallops or rub Soft with active bowel sounds No clubbing cyanosis 2+ Edema Alert and oriented, grossly normal motor and sensory function Skin Warm and Dry ECG  A pacing  70 21/15/44   Assessment  and  Plan  Hypertension   sinus node   pacemaker-[employer] The patient's device was interrogated.  The information was reviewed. No changes were made in the programming.    HFpEF   will increase her furosemide and 50 daily for one week. She'll discuss with her PCP the incorporation of her HCTZ into her losartan. I'm concerned about her anemia and the iron replacement and I will anticipate her primary physician has evaluated the cause of her anemia.

## 2014-05-07 NOTE — Patient Instructions (Signed)
Continue to follow up with [facility] every 3 months.   Your physician recommends that you schedule a follow-up appointment in: 12 months with Dr.Klein

## 2014-05-08 ENCOUNTER — Encounter: Payer: Self-pay | Admitting: Internal Medicine

## 2014-05-09 ENCOUNTER — Encounter: Payer: Self-pay | Admitting: Internal Medicine

## 2014-07-10 DIAGNOSIS — I442 Atrioventricular block, complete: Secondary | ICD-10-CM

## 2014-07-14 ENCOUNTER — Encounter: Payer: Self-pay | Admitting: *Deleted

## 2014-07-21 ENCOUNTER — Telehealth: Payer: Self-pay | Admitting: Internal Medicine

## 2014-07-21 NOTE — Telephone Encounter (Signed)
New message      Pt got a letter stating that we have not checked her pacemaker in a while.  She was here in august.  Please call

## 2014-07-21 NOTE — Telephone Encounter (Signed)
LMOVM for pt informing her to disregard letter.

## 2014-08-21 ENCOUNTER — Encounter (HOSPITAL_COMMUNITY): Payer: Self-pay | Admitting: Internal Medicine

## 2014-10-09 ENCOUNTER — Encounter: Payer: Self-pay | Admitting: Internal Medicine

## 2014-10-09 DIAGNOSIS — R55 Syncope and collapse: Secondary | ICD-10-CM

## 2015-01-08 ENCOUNTER — Encounter: Payer: Self-pay | Admitting: Internal Medicine

## 2015-01-08 DIAGNOSIS — I442 Atrioventricular block, complete: Secondary | ICD-10-CM | POA: Diagnosis not present

## 2015-04-09 DIAGNOSIS — I442 Atrioventricular block, complete: Secondary | ICD-10-CM | POA: Diagnosis not present

## 2015-06-17 ENCOUNTER — Encounter: Payer: Medicare Other | Admitting: Internal Medicine

## 2015-06-25 ENCOUNTER — Encounter: Payer: Self-pay | Admitting: Internal Medicine

## 2015-06-25 ENCOUNTER — Ambulatory Visit (INDEPENDENT_AMBULATORY_CARE_PROVIDER_SITE_OTHER): Payer: Medicare Other | Admitting: Internal Medicine

## 2015-06-25 VITALS — BP 128/98 | HR 70 | Ht 67.0 in | Wt 209.4 lb

## 2015-06-25 DIAGNOSIS — R609 Edema, unspecified: Secondary | ICD-10-CM

## 2015-06-25 DIAGNOSIS — I442 Atrioventricular block, complete: Secondary | ICD-10-CM | POA: Diagnosis not present

## 2015-06-25 DIAGNOSIS — I10 Essential (primary) hypertension: Secondary | ICD-10-CM | POA: Diagnosis not present

## 2015-06-25 DIAGNOSIS — Z950 Presence of cardiac pacemaker: Secondary | ICD-10-CM

## 2015-06-25 DIAGNOSIS — R600 Localized edema: Secondary | ICD-10-CM

## 2015-06-25 LAB — CUP PACEART INCLINIC DEVICE CHECK
Battery Voltage: 2.79 V
Brady Statistic RA Percent Paced: 75 %
Brady Statistic RV Percent Paced: 99 %
Implantable Lead Implant Date: 20130321
Implantable Lead Location: 753859
Implantable Lead Location: 753860
Lead Channel Impedance Value: 676 Ohm
Lead Channel Pacing Threshold Amplitude: 0.5 V
Lead Channel Pacing Threshold Amplitude: 0.75 V
Lead Channel Pacing Threshold Pulse Width: 0.4 ms
Lead Channel Pacing Threshold Pulse Width: 0.4 ms
Lead Channel Sensing Intrinsic Amplitude: 1.8 mV
Lead Channel Setting Pacing Amplitude: 2 V
MDC IDC LEAD IMPLANT DT: 20130321
MDC IDC MSMT BATTERY IMPEDANCE: 2500 Ohm
MDC IDC MSMT LEADCHNL RA IMPEDANCE VALUE: 369 Ohm
MDC IDC PG SERIAL: 1235689
MDC IDC SESS DTM: 20161013143935
MDC IDC SET LEADCHNL RV PACING PULSEWIDTH: 0.4 ms
MDC IDC SET LEADCHNL RV SENSING SENSITIVITY: 6 mV
Pulse Gen Model: 5826

## 2015-06-25 NOTE — Patient Instructions (Signed)
Medication Instructions: 1) Stop lasix (furosemide)  Labwork: - none  Procedures/Testing: - none  Follow-Up: - Your physician wants you to follow-up in: 6 months with the device clinic & 1 year with Dr. Graciela HusbandsKlein. You will receive a reminder letter in the mail two months in advance. If you don't receive a letter, please call our office to schedule the follow-up appointment.  Any Additional Special Instructions Will Be Listed Below (If Applicable). - none

## 2015-06-25 NOTE — Progress Notes (Signed)
Patient Care Team: Carmin RichmondJames W Davis, MD as PCP - General (Family Medicine)   HPI  Judith ShorterBarbara Flores is a 81 y.o. female seen in followup for syncope and intermittent now persistent complete heart block. She is s/p pacemaker implant   She continues to do well. She's had no positive lightheadedness or exercise intolerance.   Her biggest problem remains peripheral edema. Fluid intake is   copious. Her diuretics are poorly tolerated and that they make her feel lousy     Past Medical History  Diagnosis Date  . Peripheral edema   . HTN (hypertension)   . Syncope   . Pacemaker   . Complete heart block (HCC)     intermittent  . Atrial fibrillation (HCC)   . Recurrent upper respiratory infection (URI)     HX OF CHRONIC BRONCHITIS  . Anemia   . GERD (gastroesophageal reflux disease)     Past Surgical History  Procedure Laterality Date  . Insert / replace / remove pacemaker      [employer] [name]  . Breast tumor    . Pacemaker revision N/A 12/01/2011    Procedure: PACEMAKER REVISION;  Surgeon: Duke SalviaSteven C Emri Sample, MD;  [facility];  Service: Cardiovascular;  Laterality: N/A;    Current Outpatient Prescriptions  Medication Sig Dispense Refill  . Calcium Carbonate-Vitamin D (CALCIUM + D) 600-200 MG-UNIT TABS Take 1 tablet by mouth 2 (two) times daily.     . Cyanocobalamin (VITAMIN B-12 IJ) Inject 1,000 mLs as directed every 30 (thirty) days.    . ergocalciferol (VITAMIN D2) 50000 UNITS capsule Take 50,000 Units by mouth once a week. On mondays    . ferrous sulfate 325 (65 FE) MG EC tablet Take 1 tablet (325 mg total) by mouth 3 (three) times daily with meals. 30 tablet 11  . furosemide (LASIX) 20 MG tablet Take 20 mg by mouth daily as needed.     [name]. gemfibrozil (LOPID) 600 MG tablet Take 600 mg by mouth daily before breakfast.     . hydrochlorothiazide (HYDRODIURIL) 25 MG tablet Take 1/2 tablet by mouth once daily 30 tablet 6  . losartan (COZAAR) 100 MG tablet Take 100 mg by mouth  daily.    . metoprolol (LOPRESSOR) 50 MG tablet Take 1 tablet (50 mg total) by mouth 2 (two) times daily. 60 tablet 11  . omeprazole (PRILOSEC) 20 MG capsule Take 20 mg by mouth daily.    . potassium chloride (KLOR-CON) 10 MEQ CR tablet Take 10 mEq by mouth 2 (two) times daily.     . Rivaroxaban (XARELTO) 20 MG TABS Take 20 mg by mouth daily. 30 tablet 6  . [DISCONTINUED] cholestyramine (QUESTRAN) 4 G packet Take 1 packet by mouth 2 (two) times daily with a meal.      . [DISCONTINUED] fluticasone (FLONASE) 50 MCG/ACT nasal spray 2 sprays by Nasal route daily.       No current facility-administered medications for this visit.    Allergies  Allergen Reactions  . Sulfa Antibiotics Nausea And Vomiting  . Penicillins Rash    Review of Systems negative except from HPI and PMH  Physical Exam BP 128/98 mmHg  Pulse 70  Ht 5\' 7"  (1.702 m)  Wt 209 lb 6.4 oz (94.983 kg)  BMI 32.79 kg/m2 Well developed and well nourished in no acute distress HENT normal E scleral and icterus clear Neck Supple JVP flat; carotids brisk and full Clear to ausculation  Regular rate and rhythm, no  murmurs gallops or rub Soft with active bowel sounds No clubbing cyanosis 2+ Edema Alert and oriented, grossly normal motor and sensory function Skin Warm and Dry   ECG  AV pacing  70      Assessment and  Plan  Hypertension   sinus node dysfunction  Complete heart block  pacemaker-[employer] [name] The patient's device was interrogated.  The information was reviewed. No changes were made in the programming.    Peripheral edema  We discussed volume intake. The absence of jugular venous distention suggests that the peripheral edema is venous insufficiency not congested.  The diuretics clearly did not make her feel better 5 asked her to stop taking Her furosemide. She will work on decreasing her by mouth intake.  Blood pressure is reasonably controlled

## 2015-07-02 ENCOUNTER — Encounter: Payer: Self-pay | Admitting: Internal Medicine

## 2015-08-19 DIAGNOSIS — E611 Iron deficiency: Secondary | ICD-10-CM | POA: Insufficient documentation

## 2015-08-19 DIAGNOSIS — J069 Acute upper respiratory infection, unspecified: Secondary | ICD-10-CM | POA: Insufficient documentation

## 2015-08-19 DIAGNOSIS — N1832 Chronic kidney disease, stage 3b: Secondary | ICD-10-CM | POA: Diagnosis present

## 2015-08-19 HISTORY — DX: Chronic kidney disease, stage 3b: N18.32

## 2015-08-19 HISTORY — DX: Acute upper respiratory infection, unspecified: J06.9

## 2015-08-19 HISTORY — DX: Iron deficiency: E61.1

## 2015-10-08 DIAGNOSIS — I442 Atrioventricular block, complete: Secondary | ICD-10-CM | POA: Diagnosis not present

## 2015-12-30 ENCOUNTER — Ambulatory Visit (INDEPENDENT_AMBULATORY_CARE_PROVIDER_SITE_OTHER): Payer: Medicare Other | Admitting: *Deleted

## 2015-12-30 ENCOUNTER — Encounter: Payer: Self-pay | Admitting: Internal Medicine

## 2015-12-30 DIAGNOSIS — Z950 Presence of cardiac pacemaker: Secondary | ICD-10-CM | POA: Diagnosis not present

## 2015-12-30 LAB — CUP PACEART INCLINIC DEVICE CHECK
Battery Impedance: 3000 Ohm
Battery Voltage: 2.76 V
Date Time Interrogation Session: 20170419142926
Implantable Lead Implant Date: 20130321
Implantable Lead Implant Date: 20130321
Implantable Lead Location: 753859
Lead Channel Impedance Value: 344 Ohm
Lead Channel Impedance Value: 660 Ohm
Lead Channel Pacing Threshold Amplitude: 0.5 V
Lead Channel Pacing Threshold Amplitude: 0.75 V
Lead Channel Pacing Threshold Pulse Width: 0.4 ms
Lead Channel Sensing Intrinsic Amplitude: 1.7 mV
Lead Channel Setting Sensing Sensitivity: 6 mV
MDC IDC LEAD LOCATION: 753860
MDC IDC MSMT LEADCHNL RV PACING THRESHOLD PULSEWIDTH: 0.4 ms
MDC IDC SET LEADCHNL RA PACING AMPLITUDE: 2 V
MDC IDC SET LEADCHNL RV PACING PULSEWIDTH: 0.4 ms
Pulse Gen Serial Number: 1235689

## 2015-12-30 NOTE — Progress Notes (Signed)
Pacemaker check in clinic. Normal device function. Thresholds, sensing, impedances consistent with previous measurements. Device programmed to maximize longevity. 407 mode switches (4.2%) + xarelto. No EGMs available. Device programmed at appropriate safety margins. Histogram distribution appropriate for patient activity level. Device programmed to optimize intrinsic conduction. Estimated longevity 2.25-3yrs. Patient enrolled in TTM's with [facility]. ROV with [facility] 06/2016 Patient education completed. °

## 2016-04-07 DIAGNOSIS — I442 Atrioventricular block, complete: Secondary | ICD-10-CM | POA: Diagnosis not present

## 2016-08-10 DIAGNOSIS — E538 Deficiency of other specified B group vitamins: Secondary | ICD-10-CM

## 2016-08-10 HISTORY — DX: Deficiency of other specified B group vitamins: E53.8

## 2016-08-17 ENCOUNTER — Encounter: Payer: Self-pay | Admitting: Internal Medicine

## 2016-08-23 ENCOUNTER — Ambulatory Visit (INDEPENDENT_AMBULATORY_CARE_PROVIDER_SITE_OTHER): Payer: Medicare Other | Admitting: Internal Medicine

## 2016-08-23 ENCOUNTER — Encounter: Payer: Self-pay | Admitting: Internal Medicine

## 2016-08-23 VITALS — BP 126/80 | HR 70 | Ht 67.0 in | Wt 215.8 lb

## 2016-08-23 DIAGNOSIS — Z950 Presence of cardiac pacemaker: Secondary | ICD-10-CM

## 2016-08-23 DIAGNOSIS — I495 Sick sinus syndrome: Secondary | ICD-10-CM | POA: Diagnosis not present

## 2016-08-23 DIAGNOSIS — I442 Atrioventricular block, complete: Secondary | ICD-10-CM

## 2016-08-23 DIAGNOSIS — I4891 Unspecified atrial fibrillation: Secondary | ICD-10-CM | POA: Diagnosis not present

## 2016-08-23 LAB — CUP PACEART INCLINIC DEVICE CHECK
Battery Voltage: 2.76 V
Date Time Interrogation Session: 20171212180830
Implantable Lead Location: 753860
Implantable Pulse Generator Implant Date: 20090616
Lead Channel Pacing Threshold Pulse Width: 0.4 ms
Lead Channel Setting Pacing Pulse Width: 0.4 ms
Lead Channel Setting Sensing Sensitivity: 6 mV
MDC IDC LEAD IMPLANT DT: 20130321
MDC IDC LEAD IMPLANT DT: 20130321
MDC IDC LEAD LOCATION: 753859
MDC IDC MSMT BATTERY IMPEDANCE: 3700 Ohm
MDC IDC MSMT LEADCHNL RA IMPEDANCE VALUE: 357 Ohm
MDC IDC MSMT LEADCHNL RA PACING THRESHOLD AMPLITUDE: 0.75 V
MDC IDC MSMT LEADCHNL RA PACING THRESHOLD PULSEWIDTH: 0.4 ms
MDC IDC MSMT LEADCHNL RA SENSING INTR AMPL: 1.7 mV
MDC IDC MSMT LEADCHNL RV IMPEDANCE VALUE: 645 Ohm
MDC IDC MSMT LEADCHNL RV PACING THRESHOLD AMPLITUDE: 0.5 V
MDC IDC PG SERIAL: 1235689
MDC IDC SET LEADCHNL RA PACING AMPLITUDE: 2 V
MDC IDC STAT BRADY RA PERCENT PACED: 73 %
MDC IDC STAT BRADY RV PERCENT PACED: 99 % — AB
Pulse Gen Model: 5826

## 2016-08-23 NOTE — Patient Instructions (Signed)
Medication Instructions: - Your physician recommends that you continue on your current medications as directed. Please refer to the Current Medication list given to you today.  Labwork: - none ordered  Procedures/Testing: - none ordered  Follow-Up: - Your physician wants you to follow-up in: 6 months with the Device Clinic &  1 year with Amber Seiler, NP for Dr. Klein. You will receive a reminder letter in the mail two months in advance. If you don't receive a letter, please call our office to schedule the follow-up appointment.  Any Additional Special Instructions Will Be Listed Below (If Applicable).     If you need a refill on your cardiac medications before your next appointment, please call your pharmacy.   

## 2016-08-23 NOTE — Progress Notes (Signed)
Patient Care Team: Carmin RichmondJames W Davis, MD as PCP - General (Family Medicine)   HPI  Judith ShorterBarbara Flores is a 82 y.o. female seen in followup for syncope and intermittent now persistent complete heart block. She is s/p pacemaker implant   She continues to do well. She's had no positive lightheadedness or exercise intolerance.    She has now retired and her edema is much less problematic     Past Medical History:  Diagnosis Date  . Anemia   . Atrial fibrillation (HCC)   . Complete heart block (HCC)    intermittent  . GERD (gastroesophageal reflux disease)   . HTN (hypertension)   . Pacemaker   . Peripheral edema   . Recurrent upper respiratory infection (URI)    HX OF CHRONIC BRONCHITIS  . Syncope     Past Surgical History:  Procedure Laterality Date  . breast tumor    . INSERT / REPLACE / REMOVE PACEMAKER     St [name]  . PACEMAKER REVISION N/A 12/01/2011   Procedure: PACEMAKER REVISION;  Surgeon: Duke SalviaSteven C Seward Coran, MD;  [facility];  Service: Cardiovascular;  Laterality: N/A;    Current Outpatient Prescriptions  Medication Sig Dispense Refill  . Calcium Carbonate-Vitamin D (CALCIUM + D) 600-200 MG-UNIT TABS Take 1 tablet by mouth 2 (two) times daily.     . Cyanocobalamin (VITAMIN B-12 IJ) Inject 1,000 mLs as directed every 30 (thirty) days.    . ergocalciferol (VITAMIN D2) 50000 UNITS capsule Take 50,000 Units by mouth once a week. On mondays    . ferrous sulfate 325 (65 FE) MG EC tablet Take 1 tablet (325 mg total) by mouth 3 (three) times daily with meals. 30 tablet 11  . fluticasone (FLONASE) 50 MCG/ACT nasal spray Place 1 spray into both nostrils as needed for allergies.    . furosemide (LASIX) 20 MG tablet Take 20 mg by mouth as needed for edema.    [name]. gemfibrozil (LOPID) 600 MG tablet Take 600 mg by mouth daily before breakfast.     . hydrochlorothiazide (HYDRODIURIL) 25 MG tablet Take 1/2 tablet by mouth once daily 30 tablet 6  . losartan (COZAAR) 100 MG  tablet Take 100 mg by mouth daily.    . metoprolol (LOPRESSOR) 50 MG tablet Take 1 tablet (50 mg total) by mouth 2 (two) times daily. 60 tablet 11  . omeprazole (PRILOSEC) 20 MG capsule Take 20 mg by mouth daily.    . potassium chloride (KLOR-CON) 10 MEQ CR tablet Take 10 mEq by mouth 2 (two) times daily.     . Rivaroxaban (XARELTO) 20 MG TABS Take 20 mg by mouth daily. 30 tablet 6   No current facility-administered medications for this visit.     Allergies  Allergen Reactions  . Sulfa Antibiotics Nausea And Vomiting  . Penicillins Rash    Review of Systems negative except from HPI and PMH  Physical Exam BP 126/80   Pulse 70   Ht 5\' 7"  (1.702 m)   Wt 215 lb 12.8 oz (97.9 kg)   SpO2 96%   BMI 33.80 kg/m  Well developed and well nourished in no acute distress HENT normal E scleral and icterus clear Neck Supple JVP flat; carotids brisk and full Clear to ausculation  Regular rate and rhythm, no murmurs gallops or rub Soft with active bowel sounds No clubbing cyanosis 2+ Edema Alert and oriented, grossly normal motor and sensory function Skin Warm and Dry   ECG  AV pacing  70 19/12/42    Assessment and  Plan  Hypertension   sinus node dysfunction  Complete heart block  pacemaker-St[employer] [name] The patient's device was interrogated.  The information was reviewed. No changes were made in the programming.         Blood pressure is reasonably controlled

## 2016-11-23 ENCOUNTER — Encounter: Payer: Self-pay | Admitting: Internal Medicine

## 2016-11-23 DIAGNOSIS — I442 Atrioventricular block, complete: Secondary | ICD-10-CM | POA: Diagnosis not present

## 2017-02-07 DIAGNOSIS — R600 Localized edema: Secondary | ICD-10-CM | POA: Insufficient documentation

## 2017-02-07 HISTORY — DX: Localized edema: R60.0

## 2017-02-22 ENCOUNTER — Encounter: Payer: Self-pay | Admitting: Internal Medicine

## 2017-02-22 DIAGNOSIS — I442 Atrioventricular block, complete: Secondary | ICD-10-CM | POA: Diagnosis not present

## 2017-05-24 ENCOUNTER — Ambulatory Visit (INDEPENDENT_AMBULATORY_CARE_PROVIDER_SITE_OTHER): Payer: Medicare Other | Admitting: *Deleted

## 2017-05-24 DIAGNOSIS — I442 Atrioventricular block, complete: Secondary | ICD-10-CM

## 2017-05-24 NOTE — Progress Notes (Signed)
Pacemaker check in clinic. Normal device function. Thresholds, sensing, impedances consistent with previous measurements. Device programmed to maximize longevity.4.1 % AT/Af + Xarelto. Device programmed at appropriate safety margins. Histogram distribution appropriate for patient activity level. Device programmed to optimize intrinsic conduction. Estimated longevity 1-1.5 years. Patient enrolled in  follow-up/TTM's with [facility]. ROV 08/2017 with AS.

## 2017-06-12 DIAGNOSIS — M79604 Pain in right leg: Secondary | ICD-10-CM | POA: Insufficient documentation

## 2017-06-12 HISTORY — DX: Pain in right leg: M79.604

## 2017-08-23 ENCOUNTER — Encounter: Payer: Self-pay | Admitting: Internal Medicine

## 2017-08-23 ENCOUNTER — Ambulatory Visit: Payer: Medicare Other | Admitting: Internal Medicine

## 2017-08-23 VITALS — BP 110/62 | HR 71 | Resp 16 | Ht 67.0 in | Wt 199.8 lb

## 2017-08-23 DIAGNOSIS — Z950 Presence of cardiac pacemaker: Secondary | ICD-10-CM

## 2017-08-23 DIAGNOSIS — I495 Sick sinus syndrome: Secondary | ICD-10-CM | POA: Diagnosis not present

## 2017-08-23 DIAGNOSIS — I442 Atrioventricular block, complete: Secondary | ICD-10-CM | POA: Diagnosis not present

## 2017-08-23 DIAGNOSIS — I4891 Unspecified atrial fibrillation: Secondary | ICD-10-CM | POA: Diagnosis not present

## 2017-08-23 NOTE — Progress Notes (Signed)
Patient Care Team: Carmin Richmondavis, James W, MD as PCP - General (Family Medicine)   HPI  Judith ShorterBarbara Flores is a 83 y.o. female seen with chief complaint of pacemaker followup for syncope with intermittent now persistent complete heart block.    She continues to do well. She's had no positive lightheadedness or exercise intolerance.   Device History: Pacer implanted 1999 for complete heart block Generator replacement 2009   Date Lead Status   2013 A&V Capped  2013         She has now retired and her edema is much less problematic     Past Medical History:  Diagnosis Date  . Anemia   . Atrial fibrillation (HCC)   . Complete heart block (HCC)    intermittent  . GERD (gastroesophageal reflux disease)   . HTN (hypertension)   . Pacemaker   . Peripheral edema   . Recurrent upper respiratory infection (URI)    HX OF CHRONIC BRONCHITIS  . Syncope     Past Surgical History:  Procedure Laterality Date  . breast tumor    . INSERT / REPLACE / REMOVE PACEMAKER     St [name]  . PACEMAKER REVISION N/A 12/01/2011   Procedure: PACEMAKER REVISION;  Surgeon: Duke SalviaSteven C Klein, MD;  [facility];  Service: Cardiovascular;  Laterality: N/A;    Current Outpatient Medications  Medication Sig Dispense Refill  . Calcium Carbonate-Vitamin D (CALCIUM + D) 600-200 MG-UNIT TABS Take 1 tablet by mouth 2 (two) times daily.     . Cyanocobalamin (VITAMIN B-12 IJ) Inject 1,000 mLs as directed every 30 (thirty) days.    . ergocalciferol (VITAMIN D2) 50000 UNITS capsule Take 50,000 Units by mouth once a week. On mondays    . ferrous sulfate 325 (65 FE) MG EC tablet Take 1 tablet (325 mg total) by mouth 3 (three) times daily with meals. 30 tablet 11  . fluticasone (FLONASE) 50 MCG/ACT nasal spray Place 1 spray into both nostrils as needed for allergies.    . furosemide (LASIX) 20 MG tablet Take 20 mg by mouth as needed for edema.    [name]. gemfibrozil (LOPID) 600 MG tablet Take 600 mg by mouth  daily before breakfast.     . hydrochlorothiazide (HYDRODIURIL) 25 MG tablet Take 1/2 tablet by mouth once daily 30 tablet 6  . losartan (COZAAR) 100 MG tablet Take 100 mg by mouth daily.    . metoprolol (LOPRESSOR) 50 MG tablet Take 1 tablet (50 mg total) by mouth 2 (two) times daily. (Patient taking differently: Take 100 mg by mouth once. ) 60 tablet 11  . naproxen (NAPROSYN) 250 MG tablet Take 250 mg by mouth 2 (two) times daily as needed.    [name]. omeprazole (PRILOSEC) 20 MG capsule Take 20 mg by mouth daily.    . potassium chloride (KLOR-CON) 10 MEQ CR tablet Take 10 mEq by mouth 2 (two) times daily.     . Rivaroxaban (XARELTO) 20 MG TABS Take 20 mg by mouth daily. 30 tablet 6   No current facility-administered medications for this visit.     Allergies  Allergen Reactions  . Atorvastatin     Other reaction(s): MUSCLE PAIN  . Niacin     Other reaction(s): OTHER  . Sulfa Antibiotics Nausea And Vomiting  . Penicillins Rash    Review of Systems negative except from HPI and PMH  Physical Exam BP 110/62   Pulse 71   Resp 16   Ht  5\' 7"  (1.702 m)   Wt 199 lb 12.8 oz (90.6 kg)   SpO2 97%   BMI 31.29 kg/m  Well developed and well nourished in no acute distress HENT normal E scleral and icterus clear Neck Supple JVP flat; carotids brisk and full Clear to ausculation  Regular rate and rhythm, no murmurs gallops or rub Soft with active bowel sounds No clubbing cyanosis 2+ Edema Alert and oriented, grossly normal motor and sensory function Skin Warm and Dry   ECG obtained to assess pacemaker function AV pacing at 70 18/16/44    Assessment and  Plan  Hypertension   sinus node dysfunction  Complete heart block  Pacemaker-St. [name] The patient's device was interrogated.  The information was reviewed. No changes were made in the programming.         Blood pressure is reasonably controlled

## 2017-08-23 NOTE — Patient Instructions (Signed)
Medication Instructions: Your physician recommends that you continue on your current medications as directed. Please refer to the Current Medication list given to you today.  Labwork: None Ordered  Procedures/Testing: None Ordered  Follow-Up: Your physician recommends that you schedule a follow-up appointment in: 2 MONTHS with the [facility] for a Battery Check   Any Additional Special Instructions Will Be Listed Below (If Applicable).     If you need a refill on your cardiac medications before your next appointment, please call your pharmacy.

## 2017-09-07 LAB — CUP PACEART INCLINIC DEVICE CHECK
Battery Voltage: 2.71 V
Date Time Interrogation Session: 20181212175233
Implantable Lead Implant Date: 20130321
Implantable Lead Location: 753860
Implantable Pulse Generator Implant Date: 20090616
Lead Channel Pacing Threshold Amplitude: 0.5 V
Lead Channel Pacing Threshold Pulse Width: 0.4 ms
Lead Channel Setting Pacing Pulse Width: 0.4 ms
Lead Channel Setting Sensing Sensitivity: 6 mV
MDC IDC LEAD IMPLANT DT: 20130321
MDC IDC LEAD LOCATION: 753859
MDC IDC MSMT BATTERY IMPEDANCE: 9400 Ohm
MDC IDC MSMT LEADCHNL RA IMPEDANCE VALUE: 342 Ohm
MDC IDC MSMT LEADCHNL RA PACING THRESHOLD AMPLITUDE: 0.75 V
MDC IDC MSMT LEADCHNL RA PACING THRESHOLD PULSEWIDTH: 0.4 ms
MDC IDC MSMT LEADCHNL RA SENSING INTR AMPL: 1.8 mV
MDC IDC MSMT LEADCHNL RV IMPEDANCE VALUE: 640 Ohm
MDC IDC PG SERIAL: 1235689
MDC IDC SET LEADCHNL RA PACING AMPLITUDE: 2 V
MDC IDC STAT BRADY RA PERCENT PACED: 79 %
MDC IDC STAT BRADY RV PERCENT PACED: 99 % — AB
Pulse Gen Model: 5826

## 2017-10-16 DIAGNOSIS — L308 Other specified dermatitis: Secondary | ICD-10-CM | POA: Insufficient documentation

## 2017-10-16 HISTORY — DX: Other specified dermatitis: L30.8

## 2017-10-25 ENCOUNTER — Ambulatory Visit (INDEPENDENT_AMBULATORY_CARE_PROVIDER_SITE_OTHER): Payer: Self-pay | Admitting: *Deleted

## 2017-10-25 DIAGNOSIS — I442 Atrioventricular block, complete: Secondary | ICD-10-CM

## 2017-10-25 LAB — CUP PACEART INCLINIC DEVICE CHECK
Battery Voltage: 2.66 V
Implantable Lead Implant Date: 20130321
Implantable Lead Location: 753859
Implantable Pulse Generator Implant Date: 20090616
Lead Channel Impedance Value: 329 Ohm
Lead Channel Impedance Value: 678 Ohm
Lead Channel Setting Pacing Amplitude: 2 V
Lead Channel Setting Pacing Pulse Width: 0.4 ms
MDC IDC LEAD IMPLANT DT: 20130321
MDC IDC LEAD LOCATION: 753860
MDC IDC MSMT BATTERY IMPEDANCE: 14000 Ohm
MDC IDC SESS DTM: 20190213114441
MDC IDC SET LEADCHNL RV SENSING SENSITIVITY: 6 mV
Pulse Gen Serial Number: 1235689

## 2017-10-25 NOTE — Progress Notes (Signed)
Battery check only. Voltage 2.66V ([facility] 2.5V). ROV with DC 3/25

## 2017-12-04 ENCOUNTER — Ambulatory Visit (INDEPENDENT_AMBULATORY_CARE_PROVIDER_SITE_OTHER): Payer: Self-pay | Admitting: *Deleted

## 2017-12-04 DIAGNOSIS — I442 Atrioventricular block, complete: Secondary | ICD-10-CM

## 2017-12-04 LAB — CUP PACEART INCLINIC DEVICE CHECK
Battery Impedance: 16100 Ohm
Battery Voltage: 2.61 V
Date Time Interrogation Session: 20190325103759
Implantable Lead Implant Date: 20130321
Implantable Lead Implant Date: 20130321
Lead Channel Setting Pacing Amplitude: 2 V
Lead Channel Setting Sensing Sensitivity: 6 mV
MDC IDC LEAD LOCATION: 753859
MDC IDC LEAD LOCATION: 753860
MDC IDC MSMT LEADCHNL RA IMPEDANCE VALUE: 323 Ohm
MDC IDC MSMT LEADCHNL RV IMPEDANCE VALUE: 678 Ohm
MDC IDC PG IMPLANT DT: 20090616
MDC IDC SET LEADCHNL RV PACING PULSEWIDTH: 0.4 ms
Pulse Gen Serial Number: 1235689

## 2017-12-04 NOTE — Progress Notes (Signed)
Battery check only: Battery voltage 2.61V (ERI 2.5V)

## 2018-01-08 ENCOUNTER — Ambulatory Visit (INDEPENDENT_AMBULATORY_CARE_PROVIDER_SITE_OTHER): Payer: Self-pay | Admitting: Internal Medicine

## 2018-01-08 DIAGNOSIS — Z950 Presence of cardiac pacemaker: Secondary | ICD-10-CM

## 2018-01-08 DIAGNOSIS — I495 Sick sinus syndrome: Secondary | ICD-10-CM

## 2018-01-08 DIAGNOSIS — I442 Atrioventricular block, complete: Secondary | ICD-10-CM

## 2018-01-08 LAB — CUP PACEART INCLINIC DEVICE CHECK
Battery Impedance: 20400 Ohm
Battery Remaining Longevity: 3 mo
Battery Voltage: 2.6 V
Implantable Lead Implant Date: 20130321
Implantable Lead Location: 753859
Lead Channel Impedance Value: 333 Ohm
Lead Channel Impedance Value: 668 Ohm
Lead Channel Pacing Threshold Pulse Width: 0.4 ms
Lead Channel Setting Pacing Amplitude: 2 V
Lead Channel Setting Pacing Pulse Width: 0.4 ms
Lead Channel Setting Sensing Sensitivity: 6 mV
MDC IDC LEAD IMPLANT DT: 20130321
MDC IDC LEAD LOCATION: 753860
MDC IDC MSMT LEADCHNL RA PACING THRESHOLD AMPLITUDE: 0.75 V
MDC IDC MSMT LEADCHNL RV PACING THRESHOLD AMPLITUDE: 0.5 V
MDC IDC MSMT LEADCHNL RV PACING THRESHOLD PULSEWIDTH: 0.4 ms
MDC IDC PG IMPLANT DT: 20090616
MDC IDC PG SERIAL: 1235689
MDC IDC SESS DTM: 20190429104503
MDC IDC SET LEADCHNL RA PACING AMPLITUDE: 2 V

## 2018-01-08 LAB — CBC WITH DIFFERENTIAL/PLATELET
BASOS ABS: 0 10*3/uL (ref 0.0–0.2)
Basos: 0 %
EOS (ABSOLUTE): 0.2 10*3/uL (ref 0.0–0.4)
Eos: 3 %
Hematocrit: 38 % (ref 34.0–46.6)
Hemoglobin: 13.3 g/dL (ref 11.1–15.9)
Immature Grans (Abs): 0 10*3/uL (ref 0.0–0.1)
Immature Granulocytes: 0 %
Lymphocytes Absolute: 2 10*3/uL (ref 0.7–3.1)
Lymphs: 26 %
MCH: 33.2 pg — AB (ref 26.6–33.0)
MCHC: 35 g/dL (ref 31.5–35.7)
MCV: 95 fL (ref 79–97)
MONOS ABS: 0.6 10*3/uL (ref 0.1–0.9)
Monocytes: 8 %
NEUTROS ABS: 4.9 10*3/uL (ref 1.4–7.0)
Neutrophils: 63 %
Platelets: 166 10*3/uL (ref 150–379)
RBC: 4.01 x10E6/uL (ref 3.77–5.28)
RDW: 13.7 % (ref 12.3–15.4)
WBC: 7.8 10*3/uL (ref 3.4–10.8)

## 2018-01-08 LAB — BASIC METABOLIC PANEL
BUN / CREAT RATIO: 16 (ref 12–28)
BUN: 23 mg/dL (ref 8–27)
CHLORIDE: 110 mmol/L — AB (ref 96–106)
CO2: 21 mmol/L (ref 20–29)
Calcium: 10.5 mg/dL — ABNORMAL HIGH (ref 8.7–10.3)
Creatinine, Ser: 1.43 mg/dL — ABNORMAL HIGH (ref 0.57–1.00)
GFR calc non Af Amer: 34 mL/min/{1.73_m2} — ABNORMAL LOW (ref >59)
GFR, EST AFRICAN AMERICAN: 39 mL/min/{1.73_m2} — AB (ref >59)
GLUCOSE: 105 mg/dL — AB (ref 65–99)
POTASSIUM: 4.4 mmol/L (ref 3.5–5.2)
Sodium: 145 mmol/L — ABNORMAL HIGH (ref 134–144)

## 2018-01-08 NOTE — Patient Instructions (Signed)
Medication Instructions:  Your physician recommends that you continue on your current medications as directed. Please refer to the Current Medication list given to you today.  Labwork: You will have labs drawn today: CBC and BMP  Testing/Procedures: You are scheduled to have your pacemaker generator changed out on Friday May 3rd  Follow-Up: Your physician recommends that you schedule a follow-up appointment in:   10-14 days from your procedure date for a wound check 91 days from your procedure with Dr Graciela Husbands  Any Other Special Instructions Will Be Listed Below (If Applicable).     If you need a refill on your cardiac medications before your next appointment, please call your pharmacy.

## 2018-01-08 NOTE — Progress Notes (Signed)
Device at [facility] will replace later this week We have reviewed the benefits and risks of generator replacement.  These include but are not limited to lead fracture and infection.  The patient understands, agrees and is willing to proceed.    

## 2018-01-08 NOTE — Progress Notes (Signed)
Pacemaker check in clinic.Thresholds, sensing, impedances consistent with previous measurements. Device programmed to maximize longevity.356 AHR - no available EGMs. No high ventricular rates noted. Device programmed at appropriate safety margins. Histogram distribution appropriate for patient activity level. Device programmed to optimize intrinsic conduction.Device reached [facility] 01/04/18. Patient seen today by SK. Plan for gen change 4/3.

## 2018-01-08 NOTE — H&P (View-Only) (Signed)
Device at [facility] will replace later this week We have reviewed the benefits and risks of generator replacement.  These include but are not limited to lead fracture and infection.  The patient understands, agrees and is willing to proceed.

## 2018-01-12 ENCOUNTER — Encounter (HOSPITAL_COMMUNITY)
Admission: RE | Disposition: A | Discharge status: Home / Self Care | Payer: Self-pay | Source: Ambulatory Visit | Attending: Internal Medicine

## 2018-01-12 ENCOUNTER — Ambulatory Visit (HOSPITAL_COMMUNITY)
Admission: RE | Admit: 2018-01-12 | Discharge: 2018-01-12 | Disposition: A | Discharge status: Home / Self Care | Payer: Medicare Other | Source: Ambulatory Visit | Attending: Internal Medicine | Admitting: Internal Medicine

## 2018-01-12 DIAGNOSIS — I442 Atrioventricular block, complete: Secondary | ICD-10-CM | POA: Insufficient documentation

## 2018-01-12 DIAGNOSIS — Z45010 Encounter for checking and testing of cardiac pacemaker pulse generator [battery]: Secondary | ICD-10-CM | POA: Insufficient documentation

## 2018-01-12 HISTORY — PX: PPM GENERATOR CHANGEOUT: EP1233

## 2018-01-12 LAB — SURGICAL PCR SCREEN
MRSA, PCR: NEGATIVE
STAPHYLOCOCCUS AUREUS: NEGATIVE

## 2018-01-12 SURGERY — PPM GENERATOR CHANGEOUT

## 2018-01-12 MED ORDER — CHLORHEXIDINE GLUCONATE 4 % EX LIQD: 60.0000 mL | Freq: Once | CUTANEOUS | Status: DC

## 2018-01-12 MED ORDER — VANCOMYCIN HCL IN DEXTROSE 1-5 GM/200ML-% IV SOLN
1000.0000 mg | INTRAVENOUS | Status: AC
  Administered 2018-01-12: 1000 mg via INTRAVENOUS

## 2018-01-12 MED ORDER — HEPARIN (PORCINE) IN NACL 1000-0.9 UT/500ML-% IV SOLN
INTRAVENOUS | Status: AC
  Filled 2018-01-12: qty 500

## 2018-01-12 MED ORDER — MUPIROCIN 2 % EX OINT
1.0000 "application " | TOPICAL_OINTMENT | Freq: Once | CUTANEOUS | Status: AC
  Administered 2018-01-12: 1 via TOPICAL

## 2018-01-12 MED ORDER — SODIUM CHLORIDE 0.9 % IV SOLN
INTRAVENOUS | Status: AC
  Filled 2018-01-12: qty 2

## 2018-01-12 MED ORDER — SODIUM CHLORIDE 0.9 % IV SOLN
80.0000 mg | INTRAVENOUS | Status: AC
  Administered 2018-01-12: 80 mg

## 2018-01-12 MED ORDER — FENTANYL CITRATE (PF) 100 MCG/2ML IJ SOLN
INTRAMUSCULAR | Status: AC
  Filled 2018-01-12: qty 2

## 2018-01-12 MED ORDER — SODIUM CHLORIDE 0.9 % IV SOLN
INTRAVENOUS | Status: DC
  Administered 2018-01-12: 07:00:00 via INTRAVENOUS

## 2018-01-12 MED ORDER — MUPIROCIN 2 % EX OINT
TOPICAL_OINTMENT | CUTANEOUS | Status: AC
  Administered 2018-01-12: 1 via TOPICAL
  Filled 2018-01-12: qty 22

## 2018-01-12 MED ORDER — FENTANYL CITRATE (PF) 100 MCG/2ML IJ SOLN
INTRAMUSCULAR | Status: DC | PRN
  Administered 2018-01-12: 25 ug via INTRAVENOUS
  Administered 2018-01-12: 12.5 ug via INTRAVENOUS

## 2018-01-12 MED ORDER — LIDOCAINE HCL (PF) 1 % IJ SOLN
INTRAMUSCULAR | Status: DC | PRN
  Administered 2018-01-12: 60 mL

## 2018-01-12 MED ORDER — MIDAZOLAM HCL 5 MG/5ML IJ SOLN
INTRAMUSCULAR | Status: AC
  Filled 2018-01-12: qty 5

## 2018-01-12 MED ORDER — MIDAZOLAM HCL 5 MG/5ML IJ SOLN
INTRAMUSCULAR | Status: DC | PRN
  Administered 2018-01-12 (×3): 1 mg via INTRAVENOUS

## 2018-01-12 MED ORDER — LIDOCAINE HCL (PF) 1 % IJ SOLN
INTRAMUSCULAR | Status: AC
  Filled 2018-01-12: qty 60

## 2018-01-12 MED ORDER — VANCOMYCIN HCL IN DEXTROSE 1-5 GM/200ML-% IV SOLN
INTRAVENOUS | Status: AC
  Filled 2018-01-12: qty 200

## 2018-01-12 SURGICAL SUPPLY — 4 items
CABLE SURGICAL S-101-97-12 (CABLE) ×3 IMPLANT
PACEMAKER ASSURITY DR-RF (Pacemaker) ×3 IMPLANT
PAD DEFIB LIFELINK (PAD) ×3 IMPLANT
TRAY PACEMAKER INSERTION (PACKS) ×3 IMPLANT

## 2018-01-12 NOTE — Interval H&P Note (Signed)
History and Physical Interval Note:  01/12/2018 8:13 AM  Judith Flores  has presented today for surgery, with the diagnosis of eri  The various methods of treatment have been discussed with the patient and family. After consideration of risks, benefits and other options for treatment, the patient has consented to  Procedure(s): PPM GENERATOR CHANGEOUT (N/A) as a surgical intervention .  The patient's history has been reviewed, patient examined, no change in status, stable for surgery.  I have reviewed the patient's chart and labs.  Questions were answered to the patient's satisfaction.     Sherryl Manges

## 2018-01-12 NOTE — Discharge Instructions (Signed)
**Note Judith Flores-identified via Obfuscation** Pacemaker Battery Change, Care After °This sheet gives you information about how to care for yourself after your procedure. Your health care provider may also give you more specific instructions. If you have problems or questions, contact your health care provider. °What can I expect after the procedure? °After your procedure, it is common to have: °· Pain or soreness at the site where the pacemaker was inserted. °· Swelling at the site where the pacemaker was inserted. ° °Follow these instructions at home: °Incision care °· Keep the incision clean and dry. °? Do not take baths, swim, or use a hot tub until your health care provider approves. °? You may shower the day after your procedure, or as directed by your health care provider. °? Pat the area dry with a clean towel. Do not rub the area. This may cause bleeding. °· Follow instructions from your health care provider about how to take care of your incision. Make sure you: °? Wash your hands with soap and water before you change your bandage (dressing). If soap and water are not available, use hand sanitizer. °? Change your dressing as told by your health care provider. °? Leave stitches (sutures), skin glue, or adhesive strips in place. These skin closures may need to stay in place for 2 weeks or longer. If adhesive strip edges start to loosen and curl up, you may trim the loose edges. Do not remove adhesive strips completely unless your health care provider tells you to do that. °· Check your incision area every day for signs of infection. Check for: °? More redness, swelling, or pain. °? More fluid or blood. °? Warmth. °? Pus or a bad smell. °Activity °· Do not lift anything that is heavier than 10 lb (4.5 kg) until your health care provider says it is okay to do so. °· For the first 2 weeks, or as long as told by your health care provider: °? Avoid lifting your left arm higher than your shoulder. °? Be gentle when you move your arms over your head. It is okay  to raise your arm to comb your hair. °? Avoid strenuous exercise. °· Ask your health care provider when it is okay to: °? Resume your normal activities. °? Return to work or school. °? Resume sexual activity. °Eating and drinking °· Eat a heart-healthy diet. This should include plenty of fresh fruits and vegetables, whole grains, low-fat dairy products, and lean protein like chicken and fish. °· Limit alcohol intake to no more than 1 drink a day for non-pregnant women and 2 drinks a day for men. One drink equals 12 oz of beer, 5 oz of wine, or 1½ oz of hard liquor. °· Check ingredients and nutrition facts on packaged foods and beverages. Avoid the following types of food: °? Food that is high in salt (sodium). °? Food that is high in saturated fat, like full-fat dairy or red meat. °? Food that is high in trans fat, like fried food. °? Food and drinks that are high in sugar. °Lifestyle °· Do not use any products that contain nicotine or tobacco, such as cigarettes and e-cigarettes. If you need help quitting, ask your health care provider. °· Take steps to manage and control your weight. °· Get regular exercise. Aim for 150 minutes of moderate-intensity exercise (such as walking or yoga) or 75 minutes of vigorous exercise (such as running or swimming) each week. °· Manage other health problems, such as diabetes or high blood pressure. Ask your health  **Note Judith Flores-identified via Obfuscation** care provider how you can manage these conditions. °General instructions °· Do not drive for 24 hours after your procedure if you were given a medicine to help you relax (sedative). °· Take over-the-counter and prescription medicines only as told by your health care provider. °· Avoid putting pressure on the area where the pacemaker was placed. °· If you need an MRI after your pacemaker has been placed, be sure to tell the health care provider who orders the MRI that you have a pacemaker. °· Avoid close and prolonged exposure to electrical devices that have strong  magnetic fields. These include: °? Cell phones. Avoid keeping them in a pocket near the pacemaker, and try using the ear opposite the pacemaker. °? MP3 players. °? Household appliances, like microwaves. °? Metal detectors. °? Electric generators. °? High-tension wires. °· Keep all follow-up visits as directed by your health care provider. This is important. °Contact a health care provider if: °· You have pain at the incision site that is not relieved by over-the-counter or prescription medicines. °· You have any of these around your incision site or coming from it: °? More redness, swelling, or pain. °? Fluid or blood. °? Warmth to the touch. °? Pus or a bad smell. °· You have a fever. °· You feel brief, occasional palpitations, light-headedness, or any symptoms that you think might be related to your heart. °Get help right away if: °· You experience chest pain that is different from the pain at the pacemaker site. °· You develop a red streak that extends above or below the incision site. °· You experience shortness of breath. °· You have palpitations or an irregular heartbeat. °· You have light-headedness that does not go away quickly. °· You faint or have dizzy spells. °· Your pulse suddenly drops or increases rapidly and does not return to normal. °· You begin to gain weight and your legs and ankles swell. °Summary °· After your procedure, it is common to have pain, soreness, and some swelling where the pacemaker was inserted. °· Make sure to keep your incision clean and dry. Follow instructions from your health care provider about how to take care of your incision. °· Check your incision every day for signs of infection, such as more pain or swelling, pus or a bad smell, warmth, or leaking fluid and blood. °· Avoid strenuous exercise and lifting your left arm higher than your shoulder for 2 weeks, or as long as told by your health care provider. °This information is not intended to replace advice given to you by  your health care provider. Make sure you discuss any questions you have with your health care provider. °Document Released: 06/19/2013 Document Revised: 07/21/2016 Document Reviewed: 07/21/2016 °Elsevier Interactive Patient Education © 2017 [employer]. ° °

## 2018-01-15 ENCOUNTER — Encounter (HOSPITAL_COMMUNITY): Payer: Self-pay | Admitting: Internal Medicine

## 2018-01-24 ENCOUNTER — Ambulatory Visit (INDEPENDENT_AMBULATORY_CARE_PROVIDER_SITE_OTHER): Payer: Medicare Other | Admitting: *Deleted

## 2018-01-24 DIAGNOSIS — I442 Atrioventricular block, complete: Secondary | ICD-10-CM

## 2018-01-24 LAB — CUP PACEART INCLINIC DEVICE CHECK
Brady Statistic RA Percent Paced: 88 %
Implantable Lead Implant Date: 20130321
Implantable Pulse Generator Implant Date: 20190503
Lead Channel Impedance Value: 387.5 Ohm
Lead Channel Impedance Value: 637.5 Ohm
Lead Channel Pacing Threshold Amplitude: 0.75 V
Lead Channel Pacing Threshold Pulse Width: 0.4 ms
Lead Channel Pacing Threshold Pulse Width: 0.4 ms
Lead Channel Setting Pacing Amplitude: 2 V
Lead Channel Setting Sensing Sensitivity: 6 mV
MDC IDC LEAD IMPLANT DT: 20130321
MDC IDC LEAD LOCATION: 753859
MDC IDC LEAD LOCATION: 753860
MDC IDC MSMT BATTERY REMAINING LONGEVITY: 108 mo
MDC IDC MSMT BATTERY VOLTAGE: 3.11 V
MDC IDC MSMT LEADCHNL RA SENSING INTR AMPL: 1.8 mV
MDC IDC MSMT LEADCHNL RV PACING THRESHOLD AMPLITUDE: 0.5 V
MDC IDC SESS DTM: 20190515092437
MDC IDC SET LEADCHNL RV PACING AMPLITUDE: 0.75 V
MDC IDC SET LEADCHNL RV PACING PULSEWIDTH: 0.4 ms
MDC IDC STAT BRADY RV PERCENT PACED: 99.97 %
Pulse Gen Serial Number: 9019281

## 2018-01-24 NOTE — Progress Notes (Signed)
Wound check appointment. Dermabond removed. Wound without redness or edema. Incision edges approximated, wound well healed. Normal device function. Thresholds, sensing, and impedances consistent with implant measurements. Device programmed chronic values s/p gen change. Histogram distribution appropriate for patient and level of activity. 40 AMS episodes (5%) + xarelto. No high ventricular rates noted. Patient educated about wound care, arm mobility, lifting restrictions. ROV in 8/19 with SK.

## 2018-04-30 ENCOUNTER — Encounter: Payer: Medicare Other | Admitting: Internal Medicine

## 2018-05-10 ENCOUNTER — Encounter: Payer: Self-pay | Admitting: Internal Medicine

## 2018-05-10 DIAGNOSIS — I442 Atrioventricular block, complete: Secondary | ICD-10-CM | POA: Diagnosis not present

## 2018-06-19 ENCOUNTER — Encounter: Payer: Self-pay | Admitting: Internal Medicine

## 2018-06-19 ENCOUNTER — Ambulatory Visit: Payer: Medicare Other | Admitting: Internal Medicine

## 2018-06-19 ENCOUNTER — Encounter (INDEPENDENT_AMBULATORY_CARE_PROVIDER_SITE_OTHER): Payer: Self-pay

## 2018-06-19 VITALS — BP 132/78 | HR 70 | Ht 67.0 in | Wt 207.2 lb

## 2018-06-19 DIAGNOSIS — I442 Atrioventricular block, complete: Secondary | ICD-10-CM | POA: Diagnosis not present

## 2018-06-19 DIAGNOSIS — I495 Sick sinus syndrome: Secondary | ICD-10-CM | POA: Diagnosis not present

## 2018-06-19 DIAGNOSIS — Z950 Presence of cardiac pacemaker: Secondary | ICD-10-CM

## 2018-06-19 LAB — CUP PACEART INCLINIC DEVICE CHECK
Battery Remaining Longevity: 105 mo
Battery Voltage: 3.01 V
Brady Statistic RA Percent Paced: 83 %
Brady Statistic RV Percent Paced: 99.96 %
Date Time Interrogation Session: 20191008172214
Implantable Lead Implant Date: 20130321
Implantable Lead Location: 753859
Implantable Lead Location: 753860
Lead Channel Impedance Value: 387.5 Ohm
Lead Channel Pacing Threshold Amplitude: 1 V
Lead Channel Sensing Intrinsic Amplitude: 1.7 mV
Lead Channel Setting Pacing Amplitude: 0.875
Lead Channel Setting Pacing Pulse Width: 0.4 ms
MDC IDC LEAD IMPLANT DT: 20130321
MDC IDC MSMT LEADCHNL RA PACING THRESHOLD PULSEWIDTH: 0.4 ms
MDC IDC MSMT LEADCHNL RV IMPEDANCE VALUE: 637.5 Ohm
MDC IDC MSMT LEADCHNL RV PACING THRESHOLD AMPLITUDE: 0.75 V
MDC IDC MSMT LEADCHNL RV PACING THRESHOLD PULSEWIDTH: 0.4 ms
MDC IDC PG IMPLANT DT: 20190503
MDC IDC PG SERIAL: 9019281
MDC IDC SET LEADCHNL RA PACING AMPLITUDE: 2 V
MDC IDC SET LEADCHNL RV SENSING SENSITIVITY: 6 mV
Pulse Gen Model: 2272

## 2018-06-19 NOTE — Progress Notes (Signed)
Patient Care Team: [name], MD as PCP - General (Family Medicine)   HPI  Judith Flores is a 84 y.o. female seen with chief complaint of pacemaker followup for syncope with intermittent now persistent complete heart block.     Device History: Pacer implanted 1999 for complete heart block Generator replacement 2009   Date Lead Status   2013 A&V Capped  2013      She continues to struggle with intermittent peripheral edema.  Denies chest pain shortness of breath or palpitations.  No interval syncope.   Past Medical History:  Diagnosis Date  . Anemia   . Atrial fibrillation (HCC)   . Complete heart block (HCC)    intermittent  . GERD (gastroesophageal reflux disease)   . HTN (hypertension)   . Pacemaker   . Peripheral edema   . Recurrent upper respiratory infection (URI)    HX OF CHRONIC BRONCHITIS  . Syncope     Past Surgical History:  Procedure Laterality Date  . breast tumor    . INSERT / REPLACE / REMOVE PACEMAKER     St [name]  . PACEMAKER REVISION N/A 12/01/2011   Procedure: PACEMAKER REVISION;  Surgeon: [name], MD;  Location: [facility] CATH LAB;  Service: Cardiovascular;  Laterality: N/A;  . PPM GENERATOR CHANGEOUT N/A 01/12/2018   Procedure: PPM GENERATOR CHANGEOUT;  Surgeon: [name], MD;  Location: [facility] INVASIVE CV LAB;  Service: Cardiovascular;  Laterality: N/A;    Current Outpatient Medications  Medication Sig Dispense Refill  . Cyanocobalamin (VITAMIN B-12 IJ) Inject 1,000 mg as directed every 30 (thirty) days.     . ergocalciferol (VITAMIN D2) 50000 UNITS capsule Take 50,000 Units by mouth once a week. On mondays    . ferrous sulfate 325 (65 FE) MG EC tablet Take 1 tablet (325 mg total) by mouth 3 (three) times daily with meals. 30 tablet 11  . furosemide (LASIX) 20 MG tablet Take 20 mg by mouth as needed for edema.    . hydrochlorothiazide (HYDRODIURIL) 25 MG tablet Take 1/2 tablet by mouth once daily 30 tablet 6  . losartan (COZAAR)  100 MG tablet Take 100 mg by mouth daily.    . metoprolol (LOPRESSOR) 50 MG tablet Take 1 tablet (50 mg total) by mouth 2 (two) times daily. (Patient taking differently: Take 50-100 mg by mouth See admin instructions. 100mg  (2 pills) in AM 50mg  (1 pill) in PM) 60 tablet 11  . naproxen (NAPROSYN) 250 MG tablet Take 250 mg by mouth daily as needed for moderate pain.     [name] omeprazole (PRILOSEC) 20 MG capsule Take 20 mg by mouth daily as needed (acid reflux).     . potassium chloride SA (K-DUR,KLOR-CON) 20 MEQ tablet Take 20 mEq by mouth daily.    . Rivaroxaban (XARELTO) 20 MG TABS Take 20 mg by mouth daily. 30 tablet 6  . Skin Protectants, Misc. (EUCERIN) cream Apply 1 application topically as needed (eczema).     No current facility-administered medications for this visit.     Allergies  Allergen Reactions  . Atorvastatin     MUSCLE PAIN  . Niacin     Unknown reaction   . Sulfa Antibiotics Nausea And Vomiting  . Penicillins Hives and Rash    Has patient had a PCN reaction causing immediate rash, facial/tongue/throat swelling, SOB or lightheadedness with hypotension: Yes Has patient had a PCN reaction causing severe rash involving mucus membranes or skin necrosis: Yes Has  patient had a PCN reaction that required hospitalization: No Has patient had a PCN reaction occurring within the last 10 years: No If all of the above answers are "NO", then may proceed with Cephalosporin use.     Review of Systems negative except from HPI and PMH  Physical Exam BP 132/78   Pulse 70   Ht 5\' 7"  (1.702 m)   Wt 207 lb 3.2 oz (94 kg)   SpO2 98%   BMI 32.45 kg/m  Well developed and nourished in no acute distress HENT normal Neck supple with JVP-flat Clear Regular rate and rhythm, no murmurs or gallops Abd-soft with active BS No Clubbing cyanosis 2+ edema Skin-warm and dry A & Oriented  Grossly normal sensory and motor function   ECG A pacing @ 70 20/13/44   Assessment and   Plan  Hypertension   sinus node dysfunction  Complete heart block  Pacemaker-St. [name] The patient's device was interrogated.  The information was reviewed. No changes were made in the programming.         Blood pressure well controlled  Using lasix prn

## 2018-06-19 NOTE — Patient Instructions (Signed)
Medication Instructions:  Your physician recommends that you continue on your current medications as directed. Please refer to the Current Medication list given to you today. If you need a refill on your cardiac medications before your next appointment, please call your pharmacy.   Lab work: You will have labs drawn today: CBC and BMP  If you have labs (blood work) drawn today and your tests are completely normal, you will receive your results only by: [name] MyChart Message (if you have MyChart) OR . A paper copy in the mail If you have any lab test that is abnormal or we need to change your treatment, we will call you to review the results.  Testing/Procedures: None ordered.  Follow-Up: At [facility], you and your health needs are our priority.  As part of our continuing mission to provide you with exceptional heart care, we have created designated Provider Care Teams.  These Care Teams include your primary Cardiologist (physician) and Advanced Practice Providers (APPs -  Physician Assistants and Nurse Practitioners) who all work together to provide you with the care you need, when you need it. You will need a follow up appointment in 6 months.  Please call our office 2 months in advance to schedule this appointment.  You may see Dr Graciela Husbands or one of the following Advanced Practice Providers on your designated Care Team:   Gypsy Balsam, NP . Francis Dowse, PA-C  Any Other Special Instructions Will Be Listed Below (If Applicable).

## 2018-06-19 NOTE — Addendum Note (Signed)
Addended by: Oretha Milch on: 06/19/2018 03:44 PM   Modules accepted: Orders

## 2018-06-20 LAB — CBC
HEMATOCRIT: 37.3 % (ref 34.0–46.6)
Hemoglobin: 13.3 g/dL (ref 11.1–15.9)
MCH: 33.6 pg — ABNORMAL HIGH (ref 26.6–33.0)
MCHC: 35.7 g/dL (ref 31.5–35.7)
MCV: 94 fL (ref 79–97)
PLATELETS: 166 10*3/uL (ref 150–450)
RBC: 3.96 x10E6/uL (ref 3.77–5.28)
RDW: 13.2 % (ref 12.3–15.4)
WBC: 9.5 10*3/uL (ref 3.4–10.8)

## 2018-06-20 LAB — BASIC METABOLIC PANEL
BUN / CREAT RATIO: 15 (ref 12–28)
BUN: 23 mg/dL (ref 8–27)
CHLORIDE: 109 mmol/L — AB (ref 96–106)
CO2: 21 mmol/L (ref 20–29)
Calcium: 10.5 mg/dL — ABNORMAL HIGH (ref 8.7–10.3)
Creatinine, Ser: 1.58 mg/dL — ABNORMAL HIGH (ref 0.57–1.00)
GFR calc non Af Amer: 30 mL/min/{1.73_m2} — ABNORMAL LOW (ref >59)
GFR, EST AFRICAN AMERICAN: 35 mL/min/{1.73_m2} — AB (ref >59)
GLUCOSE: 111 mg/dL — AB (ref 65–99)
Potassium: 4.3 mmol/L (ref 3.5–5.2)
SODIUM: 146 mmol/L — AB (ref 134–144)

## 2018-06-21 ENCOUNTER — Telehealth: Payer: Self-pay

## 2018-06-21 NOTE — Telephone Encounter (Signed)
Pt aware of lab results and recommendations. She will have an ionized Ca drawn at her local [facility]. Paper order sent USPS. Pt had no additional questions.

## 2018-06-21 NOTE — Telephone Encounter (Signed)
-----   Message from [name], MD sent at 06/20/2018  8:35 AM EDT ----- Please Inform Patient  Labs are normal x borderline elevated Ca  plz order ionized Ca#  Thanks

## 2018-06-28 LAB — CALCIUM, IONIZED: Calcium, Ion: 5.4 mg/dL (ref 4.5–5.6)

## 2018-09-04 ENCOUNTER — Observation Stay (HOSPITAL_COMMUNITY): Payer: Medicare Other

## 2018-09-04 ENCOUNTER — Encounter (HOSPITAL_COMMUNITY): Payer: Self-pay | Admitting: Physician Assistant

## 2018-09-04 ENCOUNTER — Inpatient Hospital Stay (HOSPITAL_COMMUNITY)
Admission: AD | Admit: 2018-09-04 | Discharge: 2018-09-08 | DRG: 247 | Disposition: A | Discharge status: Home / Self Care | Payer: Medicare Other | Source: Other Acute Inpatient Hospital | Attending: Cardiology | Admitting: Cardiology

## 2018-09-04 DIAGNOSIS — I4892 Unspecified atrial flutter: Secondary | ICD-10-CM | POA: Diagnosis present

## 2018-09-04 DIAGNOSIS — I2510 Atherosclerotic heart disease of native coronary artery without angina pectoris: Secondary | ICD-10-CM | POA: Diagnosis not present

## 2018-09-04 DIAGNOSIS — I4891 Unspecified atrial fibrillation: Secondary | ICD-10-CM | POA: Diagnosis present

## 2018-09-04 DIAGNOSIS — Z7901 Long term (current) use of anticoagulants: Secondary | ICD-10-CM | POA: Diagnosis not present

## 2018-09-04 DIAGNOSIS — I4819 Other persistent atrial fibrillation: Secondary | ICD-10-CM | POA: Diagnosis present

## 2018-09-04 DIAGNOSIS — I2583 Coronary atherosclerosis due to lipid rich plaque: Secondary | ICD-10-CM | POA: Diagnosis not present

## 2018-09-04 DIAGNOSIS — Z950 Presence of cardiac pacemaker: Secondary | ICD-10-CM | POA: Diagnosis not present

## 2018-09-04 DIAGNOSIS — N183 Chronic kidney disease, stage 3 unspecified: Secondary | ICD-10-CM

## 2018-09-04 DIAGNOSIS — I25110 Atherosclerotic heart disease of native coronary artery with unstable angina pectoris: Secondary | ICD-10-CM | POA: Diagnosis present

## 2018-09-04 DIAGNOSIS — D649 Anemia, unspecified: Secondary | ICD-10-CM | POA: Diagnosis present

## 2018-09-04 DIAGNOSIS — R079 Chest pain, unspecified: Secondary | ICD-10-CM | POA: Diagnosis not present

## 2018-09-04 DIAGNOSIS — K219 Gastro-esophageal reflux disease without esophagitis: Secondary | ICD-10-CM | POA: Diagnosis present

## 2018-09-04 DIAGNOSIS — Z880 Allergy status to penicillin: Secondary | ICD-10-CM | POA: Diagnosis not present

## 2018-09-04 DIAGNOSIS — I214 Non-ST elevation (NSTEMI) myocardial infarction: Secondary | ICD-10-CM

## 2018-09-04 DIAGNOSIS — I237 Postinfarction angina: Secondary | ICD-10-CM | POA: Diagnosis not present

## 2018-09-04 DIAGNOSIS — R072 Precordial pain: Secondary | ICD-10-CM | POA: Diagnosis not present

## 2018-09-04 DIAGNOSIS — I129 Hypertensive chronic kidney disease with stage 1 through stage 4 chronic kidney disease, or unspecified chronic kidney disease: Secondary | ICD-10-CM | POA: Diagnosis present

## 2018-09-04 DIAGNOSIS — I10 Essential (primary) hypertension: Secondary | ICD-10-CM | POA: Diagnosis present

## 2018-09-04 DIAGNOSIS — Z79899 Other long term (current) drug therapy: Secondary | ICD-10-CM

## 2018-09-04 DIAGNOSIS — Z882 Allergy status to sulfonamides status: Secondary | ICD-10-CM

## 2018-09-04 DIAGNOSIS — R1013 Epigastric pain: Secondary | ICD-10-CM | POA: Diagnosis not present

## 2018-09-04 DIAGNOSIS — Z8249 Family history of ischemic heart disease and other diseases of the circulatory system: Secondary | ICD-10-CM

## 2018-09-04 DIAGNOSIS — Z6832 Body mass index (BMI) 32.0-32.9, adult: Secondary | ICD-10-CM | POA: Diagnosis not present

## 2018-09-04 DIAGNOSIS — E669 Obesity, unspecified: Secondary | ICD-10-CM | POA: Diagnosis present

## 2018-09-04 DIAGNOSIS — E785 Hyperlipidemia, unspecified: Secondary | ICD-10-CM | POA: Diagnosis present

## 2018-09-04 DIAGNOSIS — R7989 Other specified abnormal findings of blood chemistry: Secondary | ICD-10-CM

## 2018-09-04 DIAGNOSIS — I4811 Longstanding persistent atrial fibrillation: Secondary | ICD-10-CM | POA: Diagnosis not present

## 2018-09-04 DIAGNOSIS — N1832 Chronic kidney disease, stage 3b: Secondary | ICD-10-CM | POA: Diagnosis present

## 2018-09-04 DIAGNOSIS — Z888 Allergy status to other drugs, medicaments and biological substances status: Secondary | ICD-10-CM

## 2018-09-04 DIAGNOSIS — R778 Other specified abnormalities of plasma proteins: Secondary | ICD-10-CM

## 2018-09-04 DIAGNOSIS — Z791 Long term (current) use of non-steroidal anti-inflammatories (NSAID): Secondary | ICD-10-CM | POA: Diagnosis not present

## 2018-09-04 DIAGNOSIS — I442 Atrioventricular block, complete: Secondary | ICD-10-CM | POA: Diagnosis present

## 2018-09-04 DIAGNOSIS — I480 Paroxysmal atrial fibrillation: Secondary | ICD-10-CM | POA: Diagnosis not present

## 2018-09-04 HISTORY — DX: Paroxysmal atrial fibrillation: I48.0

## 2018-09-04 HISTORY — DX: Epigastric pain: R10.13

## 2018-09-04 HISTORY — DX: Other specified abnormalities of plasma proteins: R77.8

## 2018-09-04 HISTORY — DX: Atherosclerotic heart disease of native coronary artery without angina pectoris: I25.10

## 2018-09-04 HISTORY — DX: Unspecified hearing loss, unspecified ear: H91.90

## 2018-09-04 HISTORY — DX: Chronic kidney disease, stage 3 (moderate): N18.3

## 2018-09-04 HISTORY — DX: Ischemic cardiomyopathy: I25.5

## 2018-09-04 HISTORY — DX: Other specified abnormal findings of blood chemistry: R79.89

## 2018-09-04 HISTORY — DX: Non-ST elevation (NSTEMI) myocardial infarction: I21.4

## 2018-09-04 HISTORY — DX: Chronic kidney disease, stage 3 unspecified: N18.30

## 2018-09-04 LAB — ECHOCARDIOGRAM COMPLETE
Height: 67 in
Weight: 3291.03 oz

## 2018-09-04 LAB — BASIC METABOLIC PANEL
Anion gap: 9 (ref 5–15)
BUN: 35 mg/dL — ABNORMAL HIGH (ref 8–23)
CO2: 22 mmol/L (ref 22–32)
Calcium: 10.1 mg/dL (ref 8.9–10.3)
Chloride: 110 mmol/L (ref 98–111)
Creatinine, Ser: 1.8 mg/dL — ABNORMAL HIGH (ref 0.44–1.00)
GFR calc Af Amer: 30 mL/min — ABNORMAL LOW (ref >60)
GFR calc non Af Amer: 26 mL/min — ABNORMAL LOW (ref >60)
Glucose, Bld: 117 mg/dL — ABNORMAL HIGH (ref 70–99)
Potassium: 4.4 mmol/L (ref 3.5–5.1)
Sodium: 141 mmol/L (ref 135–145)

## 2018-09-04 LAB — TROPONIN I
Troponin I: 0.71 ng/mL (ref <0.03)
Troponin I: 0.81 ng/mL (ref <0.03)

## 2018-09-04 LAB — APTT: aPTT: >200 seconds (ref 24–36)

## 2018-09-04 LAB — HEPARIN LEVEL (UNFRACTIONATED): Heparin Unfractionated: >2.2 IU/mL — ABNORMAL HIGH (ref 0.30–0.70)

## 2018-09-04 MED ORDER — NITROGLYCERIN 0.4 MG SL SUBL
0.4000 mg | SUBLINGUAL_TABLET | SUBLINGUAL | Status: DC | PRN
  Administered 2018-09-05 (×3): 0.4 mg via SUBLINGUAL
  Filled 2018-09-04 (×3): qty 1

## 2018-09-04 MED ORDER — ALPRAZOLAM 0.25 MG PO TABS
0.2500 mg | ORAL_TABLET | Freq: Two times a day (BID) | ORAL | Status: DC | PRN
  Administered 2018-09-05 – 2018-09-07 (×2): 0.25 mg via ORAL
  Filled 2018-09-04 (×2): qty 1

## 2018-09-04 MED ORDER — HEPARIN (PORCINE) IN NACL 25000-0.45 UT/250ML-% IV SOLN
12.00 | INTRAVENOUS | Status: DC
Start: ? — End: 2018-09-04

## 2018-09-04 MED ORDER — NITROGLYCERIN 0.4 MG SL SUBL
0.40 | SUBLINGUAL_TABLET | SUBLINGUAL | Status: DC
Start: ? — End: 2018-09-04

## 2018-09-04 MED ORDER — METOPROLOL TARTRATE 25 MG PO TABS
50.0000 mg | ORAL_TABLET | Freq: Every day | ORAL | Status: DC
  Administered 2018-09-04 – 2018-09-07 (×4): 50 mg via ORAL
  Filled 2018-09-04 (×3): qty 1
  Filled 2018-09-04: qty 2

## 2018-09-04 MED ORDER — METOPROLOL TARTRATE 50 MG PO TABS: 50.0000 mg | ORAL_TABLET | ORAL | Status: DC

## 2018-09-04 MED ORDER — SODIUM CHLORIDE 0.9 % IV SOLN: 250.0000 mL | INTRAVENOUS | Status: DC | PRN

## 2018-09-04 MED ORDER — REGADENOSON 0.4 MG/5ML IV SOLN
0.4000 mg | Freq: Once | INTRAVENOUS | Status: DC
  Filled 2018-09-04: qty 5

## 2018-09-04 MED ORDER — SODIUM CHLORIDE 0.9% FLUSH: 3.0000 mL | Freq: Two times a day (BID) | INTRAVENOUS | Status: DC

## 2018-09-04 MED ORDER — SUCRALFATE 1 GM/10ML PO SUSP
1.0000 g | Freq: Three times a day (TID) | ORAL | Status: DC
  Administered 2018-09-04 – 2018-09-08 (×12): 1 g via ORAL
  Filled 2018-09-04 (×13): qty 10

## 2018-09-04 MED ORDER — ZOLPIDEM TARTRATE 5 MG PO TABS
5.0000 mg | ORAL_TABLET | Freq: Every evening | ORAL | Status: DC | PRN
  Administered 2018-09-06: 5 mg via ORAL
  Filled 2018-09-04: qty 1

## 2018-09-04 MED ORDER — ACETAMINOPHEN 325 MG PO TABS
650.0000 mg | ORAL_TABLET | ORAL | Status: DC | PRN
  Administered 2018-09-06 – 2018-09-08 (×4): 650 mg via ORAL
  Filled 2018-09-04 (×4): qty 2

## 2018-09-04 MED ORDER — SODIUM CHLORIDE 0.9% FLUSH: 3.0000 mL | INTRAVENOUS | Status: DC | PRN

## 2018-09-04 MED ORDER — RIVAROXABAN 20 MG PO TABS
20.0000 mg | ORAL_TABLET | Freq: Every day | ORAL | Status: DC
  Administered 2018-09-04: 20 mg via ORAL
  Filled 2018-09-04: qty 1

## 2018-09-04 MED ORDER — HEPARIN (PORCINE) 25000 UT/250ML-% IV SOLN: 1100.0000 [IU]/h | INTRAVENOUS | Status: AC

## 2018-09-04 MED ORDER — NITROGLYCERIN 2 % TD OINT
1.00 | TOPICAL_OINTMENT | TRANSDERMAL | Status: DC
Start: 2018-09-04 — End: 2018-09-04

## 2018-09-04 MED ORDER — ONDANSETRON HCL 4 MG/2ML IJ SOLN: 4.0000 mg | Freq: Four times a day (QID) | INTRAMUSCULAR | Status: DC | PRN

## 2018-09-04 MED ORDER — HEPARIN SODIUM (PORCINE) 1000 UNIT/ML IJ SOLN
2000.00 | INTRAMUSCULAR | Status: DC
Start: ? — End: 2018-09-04

## 2018-09-04 MED ORDER — ENOXAPARIN SODIUM 40 MG/0.4ML ~~LOC~~ SOLN: 40.0000 mg | SUBCUTANEOUS | Status: DC

## 2018-09-04 MED ORDER — REGADENOSON 0.4 MG/5ML IV SOLN
INTRAVENOUS | Status: AC
  Filled 2018-09-04: qty 5

## 2018-09-04 MED ORDER — METOPROLOL TARTRATE 25 MG PO TABS
100.0000 mg | ORAL_TABLET | Freq: Every day | ORAL | Status: DC
  Administered 2018-09-05 – 2018-09-08 (×4): 100 mg via ORAL
  Filled 2018-09-04 (×4): qty 1
  Filled 2018-09-04: qty 4

## 2018-09-04 MED ORDER — PANTOPRAZOLE SODIUM 40 MG PO TBEC
40.0000 mg | DELAYED_RELEASE_TABLET | Freq: Two times a day (BID) | ORAL | Status: DC
  Administered 2018-09-04 – 2018-09-08 (×7): 40 mg via ORAL
  Filled 2018-09-04 (×8): qty 1

## 2018-09-04 NOTE — Progress Notes (Signed)
ANTICOAGULATION CONSULT NOTE - Initial Consult  Pharmacy Consult for heparin Indication: chest pain/ACS and atrial fibrillation  Allergies  Allergen Reactions  . Atorvastatin Shortness Of Breath    MUSCLE PAIN  . Niacin     Unknown reaction   . Sulfa Antibiotics Nausea And Vomiting  . Penicillins Hives and Rash    Has patient had a PCN reaction causing immediate rash, facial/tongue/throat swelling, SOB or lightheadedness with hypotension: Yes Has patient had a PCN reaction causing severe rash involving mucus membranes or skin necrosis: Yes Has patient had a PCN reaction that required hospitalization: No Has patient had a PCN reaction occurring within the last 10 years: No If all of the above answers are "NO", then may proceed with Cephalosporin use.     Patient Measurements: Height: 5\' 7"  (170.2 cm) Weight: 205 lb 11 oz (93.3 kg) IBW/kg (Calculated) : 61.6 Heparin Dosing Weight: 81.9  Vital Signs: Temp: 97.6 F (36.4 C) (12/24 2032) Temp Source: Oral (12/24 2032) BP: 146/72 (12/24 2032) Pulse Rate: 75 (12/24 2032)  Labs: Recent Labs    09/04/18 1202 09/04/18 1855  CREATININE 1.80*  --   TROPONINI 0.71* 0.81*    Estimated Creatinine Clearance: 28.3 mL/min (A) (by C-G formula based on SCr of 1.8 mg/dL (H)).   Medical History: Past Medical History:  Diagnosis Date  . Anemia   . Atrial fibrillation (HCC)   . CKD (chronic kidney disease), stage III (HCC) 09/04/2018  . Complete heart block (HCC)    intermittent  . Epigastric pain 09/04/2018  . GERD (gastroesophageal reflux disease)   . HOH (hard of hearing)   . HTN (hypertension)   . Pacemaker   . Peripheral edema   . Recurrent upper respiratory infection (URI)    HX OF CHRONIC BRONCHITIS  . Syncope     Medications:    Assessment: 84 yo F Transfer[place] from [place] with chest pain/ concern for NTEMI. Patient on Xarelto PTA for Afib .Patient was started on heparin gtt at OSH and was continued on it today  with no active order. Xarelto was reordered and patient received dose at approx 1900 tonight while on heparin gtt. Patient not experiencing any bleeding.    Goal of Therapy:  Heparin level 0.3-0.7 units/ml aPTT 66-102 seconds Monitor platelets by anticoagulation protocol: Yes   Plan:  Hold heparin gtt for 24hrs s/p Xarelto dose tonight Recheck aPTT/ heparin level tomorrow  Ewing Schleinolton Laporscha Linehan, PharmD PGY1 Pharmacy Resident 09/04/2018    10:10 PM Please check AMION for all [facility] Pharm[facility] numbers

## 2018-09-04 NOTE — Progress Notes (Signed)
CRITICAL VALUE ALERT  Critical Value:  Troponin 0.71  Date & Time Notied:  09/04/2018 1323  Provider Notified: Theodore Demarkhonda Barrett  Orders Received/Actions taken: Stress test stopped

## 2018-09-04 NOTE — Progress Notes (Signed)
  Echocardiogram 2D Echocardiogram has been performed.  Judith ChimesWendy  Lilliahna Flores 09/04/2018, 3:15 PM

## 2018-09-04 NOTE — Progress Notes (Signed)
09/04/2018 1000 Re[facility]  Pt is here with CP and positive troponins, no CP at this time.  Tele monitor applied and CCMD notified.  CHG bath given.  Oriented to room, call light and bed.  Call bell in reach. Kathryne HitchAllen, Harbert Fitterer C

## 2018-09-04 NOTE — H&P (Addendum)
Cardiology Admission History and Physical:   Patient ID: Judith Flores; MRN: [id number]; DOB: 06/04/1934   Admission date: 09/04/2018  Primary Care Provider: [name], MD Primary Cardiologist: No primary care provider on file.  Primary Electrophysiologist:  Sherryl Manges, MD   Chief Complaint:  Chest pain  Patient Profile:   Judith Flores is a 85 y.o. female with a history of CHB s/p STJ PPM, Afib, GERD, anemia, LE edema.   History of Present Illness:   Judith Flores started having chest pain/epigastric pain about 2 months ago. Saw PCP, who checked abd [place] and that was ok. Judith Flores did not take any rx specifically for the pain. However, Judith Flores Prilosec was increased to 40 mg qd (however Judith Flores was taking the 20 mg bid).   Judith Flores was getting the pain w/ activity, such as getting ready for bed and laid down. No or minimal sx sitting up. Judith Flores was getting episodes regularly but not constantly.   Yesterday, Judith Flores got sx after putting packages in the car. Judith Flores was bending over to do this. When Judith Flores sat in the car, the pain resolved.   Judith Flores pain started last pm when Judith Flores laid down to go to bed. It was like a fire 8/10. No change w/ deep inspiration. Judith Flores went to [facility] ER and got a GI cocktail which relieved the pain.   However, Judith Flores troponin was elevated, so tx to [facility]. EF nl by echo.   Judith Flores was walking for exercise but has not done that in a week or so due to the weather. Judith Flores tries to stay active. No hx exertional sx. Judith Flores walks better in the grocery store because Judith Flores can hold on to the cart.    Past Medical History:  Diagnosis Date  . Anemia   . Atrial fibrillation (HCC)   . CKD (chronic kidney disease), stage III (HCC) 09/04/2018  . Complete heart block (HCC)    intermittent  . Epigastric pain 09/04/2018  . GERD (gastroesophageal reflux disease)   . HOH (hard of hearing)   . HTN (hypertension)   . Pacemaker   . Peripheral edema   . Recurrent upper respiratory infection (URI)    HX OF CHRONIC  BRONCHITIS  . Syncope     Past Surgical History:  Procedure Laterality Date  . breast tumor    . INSERT / REPLACE / REMOVE PACEMAKER     St [name]  . PACEMAKER REVISION N/A 12/01/2011   Procedure: PACEMAKER REVISION;  Surgeon: [name], MD;  Location[facility] CATH LAB;  Service: Cardiovascular;  Laterality: N/A;  . PPM GENERATOR CHANGEOUT N/A 01/12/2018   Procedure: PPM GENERATOR CHANGEOUT;  Surgeon: [name], MD;  Location: [facility] INVASIVE CV LAB;  Service: Cardiovascular;  Laterality: N/A;     Medications Prior to Admission: Prior to Admission medications   Medication Sig Start Date End Date Taking? Authorizing Provider  Cyanocobalamin (VITAMIN B-12 IJ) Inject 1,000 mg as directed every 30 (thirty) days.     [provider]  ergocalciferol (VITAMIN D2) 50000 UNITS capsule Take 50,000 Units by mouth once a week. On mondays    [provider]  ferrous sulfate 325 (65 FE) MG EC tablet Take 1 tablet (325 mg total) by mouth 3 (three) times daily with meals. 05/07/14   [name], MD  furosemide (LASIX) 20 MG tablet Take 20 mg by mouth as needed for edema. 03/23/16   [provider]  hydrochlorothiazide (HYDRODIURIL) 25 MG tablet Take 1/2 tablet by mouth  once daily 03/26/13   [name], MD  losartan (COZAAR) 100 MG tablet Take 100 mg by mouth daily.    [provider]  metoprolol (LOPRESSOR) 50 MG tablet Take 1 tablet (50 mg total) by mouth 2 (two) times daily. Patient taking differently: Take 50-100 mg by mouth See admin instructions. 100mg  (2 pills) in AM 50mg  (1 pill) in PM 03/26/13   [name], MD  naproxen (NAPROSYN) 250 MG tablet Take 250 mg by mouth daily as needed for moderate pain.     [provider]  omeprazole (PRILOSEC) 20 MG capsule Take 20 mg by mouth daily as needed (acid reflux).  02/06/15   [provider]  potassium chloride SA (K-DUR,KLOR-CON) 20 MEQ tablet Take 20 mEq by mouth daily.    [provider]  Rivaroxaban (XARELTO) 20 MG TABS Take 20 mg by mouth daily. 02/16/12   Edmisten, Herby Abraham, PA-C  Skin Protectants, Misc. (EUCERIN) cream Apply 1 application topically as needed (eczema).    [provider]  cholestyramine Lanetta Inch) 4 G packet Take 1 packet by mouth 2 (two) times daily with a meal.    11/17/11  [provider]     Allergies:    Allergies  Allergen Reactions  . Atorvastatin     MUSCLE PAIN  . Niacin     Unknown reaction   . Sulfa Antibiotics Nausea And Vomiting  . Penicillins Hives and Rash    Has patient had a PCN reaction causing immediate rash, facial/tongue/throat swelling, SOB or lightheadedness with hypotension: Yes Has patient had a PCN reaction causing severe rash involving mucus membranes or skin necrosis: Yes Has patient had a PCN reaction that required hospitalization: No Has patient had a PCN reaction occurring within the last 10 years: No If all of the above answers are "NO", then may proceed with Cephalosporin use.     Social History:   Social History   Tobacco Use  . Smoking status: Never Smoker  . Smokeless tobacco: Never Used  Substance Use Topics  . Alcohol use: No  . Drug use: No   Social History   Social History Narrative  . Not on file     Family History:   The patient's family history includes Heart disease in Judith Flores father and mother.   The patient Judith Flores indicated that Judith Flores mother is deceased. Judith Flores indicated that Judith Flores father is deceased. Judith Flores indicated that Judith Flores maternal grandmother is deceased. Judith Flores indicated that Judith Flores maternal grandfather is deceased. Judith Flores indicated that Judith Flores paternal grandmother is deceased. Judith Flores indicated that Judith Flores paternal grandfather is deceased.   ROS:  Please see the history of present illness.  All other ROS reviewed and negative.     Physical Exam/Data:   Vitals:   09/04/18 1020  BP: 130/72  Pulse: 71  Resp: 18  Temp: 97.6 F (36.4 C)  TempSrc: Oral  SpO2: 100%  Weight: 93.3 kg    Height: 5\' 7"  (1.702 m)   No intake or output data in the 24 hours ending 09/04/18 1109 Filed Weights   09/04/18 1020  Weight: 93.3 kg   Body mass index is 32.22 kg/m.  General:  Well nourished, well developed, in no acute distress HEENT: normal Lymph: no adenopathy Neck:  JVD not elevated Endocrine:  No thryomegaly Vascular: No carotid bruits; FA pulses 2+ bilaterally   Cardiac:  normal S1, S2; RRR; soft murmur, no rub or gallop  Lungs:  clear to auscultation bilaterally, no wheezing, rhonchi or  rales  Abd: soft, nontender, no hepatomegaly  Ext: no edema Musculoskeletal:  No deformities, BUE and BLE strength normal and equal Skin: warm and dry  Neuro:  CNs 2-12 intact, no focal abnormalities noted Psych:  Normal affect    EKG:  The ECG that was done 12/24 was personally reviewed and demonstrates atrial ventricular pacing  Relevant CV Studies:  None  Laboratory Data:  Chemistry  Sodium 141, potassium 4.1, chloride 109, CO2 25, BUN 33, creatinine 1.8, GFR 26, glucose 129, calcium 10.5, albumin 3.9, other LFTs within normal limits Lipase 144 (44-232)  Hematology Hemoglobin 12.7, hematocrit 36.5, WBCs 8.3, platelets 141  Cardiac Enzymes  Troponin I 0.213   Radiology/Studies:  2 [facility] Cardi[facility]iastinal silhouette is normal, no pleural effusions or focal consolidations.  Mild hyperinflation with left lung base strandy densities mild thoracic spondylosis  Assessment and Plan:   Principal Problem: 1.  Epigastric pain -Judith Flores Prilosec and recently been increased by Judith Flores PCP to 40 mg daily, Judith Flores was taking 20 mg twice daily instead - Symptoms were [facility] by a GI cocktail -Consider going to Protonix 40 mg twice daily and adding Carafate 1 g q. before meals and at bedtime to see if this manages Judith Flores symptoms -If it does, can follow-up with PCP and let him decide on GI eval  Active Problems: 2.  Hypertension -Continue home  blood pressure medications, Judith Flores states that Judith Flores blood pressure is generally well controlled  3.  Elevated troponin - No history of exertional chest pain - Recheck troponin and establish trend. - If do not cath today, would have to wait until Thursday. -However, Judith Flores took Judith Flores Xarelto yesterday morning -Review with MD, may need Myoview for risk stratification -We will check echo  4.  CKD (chronic kidney disease), stage III (HCC) - Creatinine in October was 1.58, before that was 1.43. - Hydrate gently and follow.  Signed, Theodore DemarkRhonda Barrett, PA-C  09/04/2018 11:09 AM   ATTENDING ATTESTATION:  I have seen, examined and evaluated the patient this AM along with Ms. Raford PitcherBarrett, PA-C.  After reviewing all the available data and chart, we discussed the patients laboratory, study & physical findings as well as symptoms in detail. I agree with Judith Flores findings, examination as well as impression recommendations as per our discussion.     Interesting scenario 84 year old woman with history of A. fib and pacemaker for tachybradycardia who now is here for chest discomfort that has some typical and some atypical features for angina.  Symptoms seem to have been relieved with a combination of GI cocktail and nitroglycerin.  Judith Flores was transferred because of elevated troponin which is concerning for non-ST elevation MI, however I am not certain that it may not be related to Judith Flores elevated creatinine.  Judith Flores is currently on Xarelto but has not had it today.  This would probably preclude Judith Flores from going for cardiac catheterization today.  Would also prefer to gently hydrate Judith Flores based on Judith Flores creatinine being mildly elevated today with worsening CKD.  In the interim while we are waiting for Judith Flores renal function to stabilize and have Judith Flores being off Xarelto, we can evaluate Judith Flores for ischemic CAD with a Myoview stress test.  This will help us determine whether or not we need to consider cardiac catheterization on Thursday (December  26).   For now, since the troponins are elevated, will continue heparin until stress test completed. We do not need to restart Xarelto while Judith Flores is on heparin,  but pending results of stress test may be started. Continue beta-blocker and PPI. Is not on a statin.  Depending on results of stress test plus/minus catheterization will likely need to consider adding statin.    Bryan Lemmaavid Harding, M.D., M.S. Interventional Cardiologist   Pager # [phone] Phone # [phone] [address] [place], [place] [address]    Severity of Illness: The appropriate patient status for this patient is OBSERVATION. Observation status is judged to be reasonable and necessary in order to provide the required intensity of service to ensure the patient's safety. The patient's presenting symptoms, physical exam findings, and initial radiographic and laboratory data in the context of their medical condition is felt to place them at decreased risk for further clinical deterioration. Furthermore, it is anticipated that the patient will be medically stable for discharge from the hospital within 2 midnights of admission. The following factors support the patient status of observation.   " The patient's presenting symptoms include epigastric pain. " The physical exam findings include abnormal troponin. " The initial radiographic and laboratory data are notable for an elevated troponin.  --  ADDENDUM: Patient went down for stress test, however Judith Flores troponin levels here returned 0.07 which is more concerning for non-ST elation MI.  Remainder stress test was discontinued, we will plan for cardiac catheterization on Thursday.  Have changed Judith Flores to inpatient admission with new diagnosis now of non-STEMI.. I suspect that Judith Flores symptom was angina decubitus.    Bryan Lemmaavid Harding, M.D., M.S. Interventional Cardiologist   Pager # [phone] Phone # [phone] [address] [place], [place]  [address]     For questions or updates, please contact [facility] Please consult [email or web address] for contact info under Cardiology/STEMI.

## 2018-09-05 ENCOUNTER — Other Ambulatory Visit: Payer: Self-pay

## 2018-09-05 ENCOUNTER — Encounter (HOSPITAL_COMMUNITY): Payer: Self-pay

## 2018-09-05 LAB — BASIC METABOLIC PANEL
Anion gap: 8 (ref 5–15)
BUN: 31 mg/dL — ABNORMAL HIGH (ref 8–23)
CO2: 23 mmol/L (ref 22–32)
Calcium: 9.6 mg/dL (ref 8.9–10.3)
Chloride: 109 mmol/L (ref 98–111)
Creatinine, Ser: 1.63 mg/dL — ABNORMAL HIGH (ref 0.44–1.00)
GFR calc non Af Amer: 29 mL/min — ABNORMAL LOW (ref >60)
GFR, EST AFRICAN AMERICAN: 34 mL/min — AB (ref >60)
Glucose, Bld: 108 mg/dL — ABNORMAL HIGH (ref 70–99)
Potassium: 3.8 mmol/L (ref 3.5–5.1)
Sodium: 140 mmol/L (ref 135–145)

## 2018-09-05 LAB — LIPID PANEL
Cholesterol: 193 mg/dL (ref 0–200)
HDL: 31 mg/dL — ABNORMAL LOW (ref >40)
LDL Cholesterol: 125 mg/dL — ABNORMAL HIGH (ref 0–99)
Total CHOL/HDL Ratio: 6.2 RATIO
Triglycerides: 187 mg/dL — ABNORMAL HIGH (ref <150)
VLDL: 37 mg/dL (ref 0–40)

## 2018-09-05 LAB — CBC
HCT: 33.3 % — ABNORMAL LOW (ref 36.0–46.0)
Hemoglobin: 11.3 g/dL — ABNORMAL LOW (ref 12.0–15.0)
MCH: 33.2 pg (ref 26.0–34.0)
MCHC: 33.9 g/dL (ref 30.0–36.0)
MCV: 97.9 fL (ref 80.0–100.0)
Platelets: 107 10*3/uL — ABNORMAL LOW (ref 150–400)
RBC: 3.4 MIL/uL — ABNORMAL LOW (ref 3.87–5.11)
RDW: 13.6 % (ref 11.5–15.5)
WBC: 7.1 10*3/uL (ref 4.0–10.5)
nRBC: 0 % (ref 0.0–0.2)

## 2018-09-05 LAB — FERRITIN: Ferritin: 169 ng/mL (ref 11–307)

## 2018-09-05 LAB — TROPONIN I: Troponin I: 0.7 ng/mL (ref <0.03)

## 2018-09-05 LAB — MRSA PCR SCREENING: MRSA by PCR: NEGATIVE

## 2018-09-05 LAB — HEMOGLOBIN A1C
Hgb A1c MFr Bld: 5.7 % — ABNORMAL HIGH (ref 4.8–5.6)
MEAN PLASMA GLUCOSE: 117 mg/dL

## 2018-09-05 MED ORDER — ALUM & MAG HYDROXIDE-SIMETH 200-200-20 MG/5ML PO SUSP
30.0000 mL | ORAL | Status: DC | PRN
  Administered 2018-09-05 – 2018-09-07 (×3): 30 mL via ORAL
  Filled 2018-09-05 (×3): qty 30

## 2018-09-05 MED ORDER — HEPARIN (PORCINE) 25000 UT/250ML-% IV SOLN
950.0000 [IU]/h | INTRAVENOUS | Status: DC
  Administered 2018-09-05: 1100 [IU]/h via INTRAVENOUS
  Filled 2018-09-05: qty 250

## 2018-09-05 MED ORDER — SODIUM CHLORIDE 0.9 % IV SOLN: 250.0000 mL | INTRAVENOUS | Status: DC | PRN

## 2018-09-05 MED ORDER — ASPIRIN 81 MG PO CHEW
81.0000 mg | CHEWABLE_TABLET | ORAL | Status: AC
  Administered 2018-09-06: 81 mg via ORAL
  Filled 2018-09-05: qty 1

## 2018-09-05 MED ORDER — SODIUM CHLORIDE 0.9% FLUSH: 3.0000 mL | INTRAVENOUS | Status: DC | PRN

## 2018-09-05 MED ORDER — SODIUM CHLORIDE 0.9% FLUSH
3.0000 mL | Freq: Two times a day (BID) | INTRAVENOUS | Status: DC
  Administered 2018-09-05 (×2): 3 mL via INTRAVENOUS

## 2018-09-05 MED ORDER — SODIUM CHLORIDE 0.9 % WEIGHT BASED INFUSION
3.0000 mL/kg/h | INTRAVENOUS | Status: AC
  Administered 2018-09-06: 3 mL/kg/h via INTRAVENOUS

## 2018-09-05 MED ORDER — SODIUM CHLORIDE 0.9 % WEIGHT BASED INFUSION
1.0000 mL/kg/h | INTRAVENOUS | Status: DC
  Administered 2018-09-06: 1 mL/kg/h via INTRAVENOUS

## 2018-09-05 NOTE — Plan of Care (Signed)
  Problem: Education: Goal: Knowledge of General Education information will improve Description Including pain rating scale, medication(s)/side effects and non-pharmacologic comfort measures Outcome: Progressing   Problem: Clinical Measurements: Goal: Ability to maintain clinical measurements within normal limits will improve Outcome: Progressing Goal: Respiratory complications will improve Outcome: Progressing Goal: Cardiovascular complication will be avoided Outcome: Progressing   Problem: Elimination: Goal: Will not experience complications related to bowel motility Outcome: Progressing   Problem: Safety: Goal: Ability to remain free from injury will improve Outcome: Progressing

## 2018-09-05 NOTE — Progress Notes (Signed)
VAST RN contacted unit RN regarding placement of PIV for cardiac cath tomorrow. Educated that PIV needs to be placed closer to cath scheduled time d/t possibility of losing access and vein prservation purposes. Unit RN verbalized understanding and stated she would report to night shift to place IVT consult early in am.

## 2018-09-05 NOTE — Progress Notes (Signed)
ANTICOAGULATION CONSULT NOTE  Pharmacy Consult for heparin Indication: chest pain/ACS and atrial fibrillation  Allergies  Allergen Reactions  . Atorvastatin Shortness Of Breath    MUSCLE PAIN  . Niacin     Unknown reaction   . Sulfa Antibiotics Nausea And Vomiting  . Penicillins Hives and Rash    Has patient had a PCN reaction causing immediate rash, facial/tongue/throat swelling, SOB or lightheadedness with hypotension: Yes Has patient had a PCN reaction causing severe rash involving mucus membranes or skin necrosis: Yes Has patient had a PCN reaction that required hospitalization: No Has patient had a PCN reaction occurring within the last 10 years: No If all of the above answers are "NO", then may proceed with Cephalosporin use.     Patient Measurements: Height: 5\' 7"  (170.2 cm) Weight: 205 lb 14.4 oz (93.4 kg) IBW/kg (Calculated) : 61.6 Heparin Dosing Weight: 81.9  Vital Signs: Temp: 97.6 F (36.4 C) (12/25 0433) Temp Source: Oral (12/25 0433) BP: 102/60 (12/25 0433) Pulse Rate: 69 (12/25 0433)  Labs: Recent Labs    09/04/18 1202 09/04/18 1855 09/04/18 2150 09/05/18 0048  HGB  --   --   --  11.3*  HCT  --   --   --  33.3*  PLT  --   --   --  107*  APTT  --   --  >200*  --   HEPARINUNFRC  --   --  >2.20*  --   CREATININE 1.80*  --   --  1.63*  TROPONINI 0.71* 0.81*  --  0.70*    Estimated Creatinine Clearance: 31.2 mL/min (A) (by C-G formula based on SCr of 1.63 mg/dL (H)).  Assessment: 84 yo [place] [facility] with chest pain/ concern for NSTEMI, h/o Afib and Xarelto now on hold, for heparin.  Last dose of Xarelto ~ 7 pm 12/24  Goal of Therapy:  Heparin level 0.3-0.7 units/ml aPTT 66-102 seconds Monitor platelets by anticoagulation protocol: Yes   Plan:  Start heparin 1100 units/hr at 1900 tonight Follow-up am labs tomorrow morning.   Geannie RisenGreg Analilia Geddis, PharmD, BCPS

## 2018-09-05 NOTE — Plan of Care (Signed)

## 2018-09-05 NOTE — Progress Notes (Signed)
Progress Note  Patient Name: Judith Flores Date of Encounter: 09/05/2018  Primary Cardiologist: WST  Primary Electrophysiologist: SK   Patient Profile     84 y.o. female admitted in transfer with chest pain and found to have + troponin  Hx CHB s/p STJ PPM, Afib, GERD, anemia, LE edema.  Subjective    transient chest pain yday going to [facility] none since No sob  Inpatient Medications    Scheduled Meds: . metoprolol tartrate  100 mg Oral Daily   And  . metoprolol tartrate  50 mg Oral QHS  . pantoprazole  40 mg Oral BID AC  . regadenoson  0.4 mg Intravenous Once  . sodium chloride flush  3 mL Intravenous Q12H  . sucralfate  1 g Oral TID WC & HS   Continuous Infusions: . sodium chloride    . heparin     PRN Meds: sodium chloride, acetaminophen, ALPRAZolam, nitroGLYCERIN, ondansetron (ZOFRAN) IV, sodium chloride flush, zolpidem   Vital Signs    Vitals:   09/04/18 1600 09/04/18 2032 09/05/18 0433 09/05/18 0500  BP: 129/60 (!) 146/72 102/60   Pulse: 70 75 69   Resp: 16 (!) 27 16   Temp:  97.6 F (36.4 C) 97.6 F (36.4 C)   TempSrc:  Oral Oral   SpO2: 100% 99% 96%   Weight:    93.4 kg  Height:        Intake/Output Summary (Last 24 hours) at 09/05/2018 0812 Last data filed at 09/05/2018 0112 Gross per 24 hour  Intake 33.6 ml  Output -  Net 33.6 ml   Filed Weights   09/04/18 1020 09/05/18 0500  Weight: 93.3 kg 93.4 kg    Telemetry    VT NS  - Personally Reviewed P-synchronous/ AV  pacing   ECG       Physical Exam   GEN: No acute distress.   Neck: JVDflat Cardiac: RRR, no  murmurs, rubs, or gallops.  Respiratory: Clear to auscultation bilaterally. GI: Soft, nontender, non-distended  MS:  edema; No deformity. Neuro:  Nonfocal  Psych: Normal affect  Skin Warm and dry   Labs    Chemistry Recent Labs  Lab 09/04/18 1202 09/05/18 0048  NA 141 140  K 4.4 3.8  CL 110 109  CO2 22 23  GLUCOSE 117* 108*  BUN 35* 31*  CREATININE 1.80* 1.63*    CALCIUM 10.1 9.6  GFRNONAA 26* 29*  GFRAA 30* 34*  ANIONGAP 9 8     Hematology Recent Labs  Lab 09/05/18 0048  WBC 7.1  RBC 3.40*  HGB 11.3*  HCT 33.3*  MCV 97.9  MCH 33.2  MCHC 33.9  RDW 13.6  PLT 107*    Cardiac Enzymes Recent Labs  Lab 09/04/18 1202 09/04/18 1855 09/05/18 0048  TROPONINI 0.71* 0.81* 0.70*   No results for input(s): TROPIPOC in the last 168 hours.   BNPNo results for input(s): BNP, PROBNP in the last 168 hours.   DDimer No results for input(s): DDIMER in the last 168 hours.   Radiology    Nm Myocar Single W/spect W/wall Motion And Ef  Result Date: 09/05/2018  Defect 1: There is a small defect of moderate severity present in the apex location.  This can be seen with apical thinning or prior infarct.  Rest only images obtained.     Cardiac Studies   As above   Device Interrogation        Assessment & Plan    NSTEMI  CHB  Pacemaker St [name]   CRI grade 3   Anemia new   Cath set for tomorrow with + Tn;  Stress portion of myoview cancelled 2/2 + troponin  Anemia new-- will check ferritin and Iron studies  Will be hydrated prior to Cath     Signed, Sherryl MangesSteven Darcella Shiffman, MD  09/05/2018, 8:12 AM

## 2018-09-06 ENCOUNTER — Encounter (HOSPITAL_COMMUNITY): Payer: Self-pay | Admitting: Cardiology

## 2018-09-06 ENCOUNTER — Other Ambulatory Visit: Payer: Self-pay

## 2018-09-06 ENCOUNTER — Inpatient Hospital Stay (HOSPITAL_COMMUNITY)
Admission: AD | Disposition: A | Discharge status: Home / Self Care | Payer: Self-pay | Source: Other Acute Inpatient Hospital | Attending: Cardiology

## 2018-09-06 DIAGNOSIS — I2510 Atherosclerotic heart disease of native coronary artery without angina pectoris: Secondary | ICD-10-CM

## 2018-09-06 DIAGNOSIS — N183 Chronic kidney disease, stage 3 (moderate): Secondary | ICD-10-CM

## 2018-09-06 DIAGNOSIS — I25110 Atherosclerotic heart disease of native coronary artery with unstable angina pectoris: Secondary | ICD-10-CM

## 2018-09-06 DIAGNOSIS — I237 Postinfarction angina: Secondary | ICD-10-CM

## 2018-09-06 DIAGNOSIS — D649 Anemia, unspecified: Secondary | ICD-10-CM

## 2018-09-06 DIAGNOSIS — I480 Paroxysmal atrial fibrillation: Secondary | ICD-10-CM

## 2018-09-06 HISTORY — PX: LEFT HEART CATH AND CORONARY ANGIOGRAPHY: CATH118249

## 2018-09-06 LAB — HEPARIN LEVEL (UNFRACTIONATED)
Heparin Unfractionated: 1.7 IU/mL — ABNORMAL HIGH (ref 0.30–0.70)
Heparin Unfractionated: 1.9 IU/mL — ABNORMAL HIGH (ref 0.30–0.70)
Heparin Unfractionated: 0.84 IU/mL — ABNORMAL HIGH (ref 0.30–0.70)

## 2018-09-06 LAB — BASIC METABOLIC PANEL
Anion gap: 9 (ref 5–15)
BUN: 26 mg/dL — ABNORMAL HIGH (ref 8–23)
CO2: 23 mmol/L (ref 22–32)
CREATININE: 1.62 mg/dL — AB (ref 0.44–1.00)
Calcium: 9.6 mg/dL (ref 8.9–10.3)
Chloride: 107 mmol/L (ref 98–111)
GFR calc Af Amer: 34 mL/min — ABNORMAL LOW (ref >60)
GFR calc non Af Amer: 29 mL/min — ABNORMAL LOW (ref >60)
Glucose, Bld: 119 mg/dL — ABNORMAL HIGH (ref 70–99)
Potassium: 3.5 mmol/L (ref 3.5–5.1)
Sodium: 139 mmol/L (ref 135–145)

## 2018-09-06 LAB — IRON AND TIBC
Iron: 67 ug/dL (ref 28–170)
SATURATION RATIOS: 25 % (ref 10.4–31.8)
TIBC: 267 ug/dL (ref 250–450)
UIBC: 200 ug/dL

## 2018-09-06 LAB — CBC
HCT: 32.6 % — ABNORMAL LOW (ref 36.0–46.0)
Hemoglobin: 10.9 g/dL — ABNORMAL LOW (ref 12.0–15.0)
MCH: 32.2 pg (ref 26.0–34.0)
MCHC: 33.4 g/dL (ref 30.0–36.0)
MCV: 96.4 fL (ref 80.0–100.0)
PLATELETS: 114 10*3/uL — AB (ref 150–400)
RBC: 3.38 MIL/uL — AB (ref 3.87–5.11)
RDW: 13.6 % (ref 11.5–15.5)
WBC: 6.5 10*3/uL (ref 4.0–10.5)
nRBC: 0 % (ref 0.0–0.2)

## 2018-09-06 LAB — APTT
aPTT: 126 seconds — ABNORMAL HIGH (ref 24–36)
aPTT: >200 seconds (ref 24–36)

## 2018-09-06 SURGERY — LEFT HEART CATH AND CORONARY ANGIOGRAPHY
Anesthesia: LOCAL

## 2018-09-06 MED ORDER — SODIUM CHLORIDE 0.9 % IV SOLN: 250.0000 mL | INTRAVENOUS | Status: DC | PRN

## 2018-09-06 MED ORDER — HEPARIN (PORCINE) IN NACL 1000-0.9 UT/500ML-% IV SOLN
INTRAVENOUS | Status: DC | PRN
  Administered 2018-09-06: 500 mL

## 2018-09-06 MED ORDER — HEPARIN (PORCINE) IN NACL 1000-0.9 UT/500ML-% IV SOLN
INTRAVENOUS | Status: AC
  Filled 2018-09-06: qty 1000

## 2018-09-06 MED ORDER — FENTANYL CITRATE (PF) 100 MCG/2ML IJ SOLN
INTRAMUSCULAR | Status: DC | PRN
  Administered 2018-09-06 (×2): 25 ug via INTRAVENOUS

## 2018-09-06 MED ORDER — SODIUM CHLORIDE 0.9% FLUSH
3.0000 mL | Freq: Two times a day (BID) | INTRAVENOUS | Status: DC
  Administered 2018-09-07: 22:00:00 3 mL via INTRAVENOUS

## 2018-09-06 MED ORDER — FENTANYL CITRATE (PF) 100 MCG/2ML IJ SOLN
INTRAMUSCULAR | Status: AC
  Filled 2018-09-06: qty 2

## 2018-09-06 MED ORDER — MIDAZOLAM HCL 2 MG/2ML IJ SOLN
INTRAMUSCULAR | Status: AC
  Filled 2018-09-06: qty 2

## 2018-09-06 MED ORDER — HEPARIN SODIUM (PORCINE) 1000 UNIT/ML IJ SOLN
INTRAMUSCULAR | Status: DC | PRN
  Administered 2018-09-06: 4500 [IU] via INTRAVENOUS

## 2018-09-06 MED ORDER — HEPARIN (PORCINE) 25000 UT/250ML-% IV SOLN
900.0000 [IU]/h | INTRAVENOUS | Status: DC
  Administered 2018-09-06: 950 [IU]/h via INTRAVENOUS
  Administered 2018-09-07: 1000 [IU]/h via INTRAVENOUS
  Filled 2018-09-06: qty 250

## 2018-09-06 MED ORDER — NITROGLYCERIN IN D5W 200-5 MCG/ML-% IV SOLN: 0.0000 ug/min | INTRAVENOUS | Status: DC

## 2018-09-06 MED ORDER — SODIUM CHLORIDE 0.9 % WEIGHT BASED INFUSION
1.0000 mL/kg/h | INTRAVENOUS | Status: AC
  Administered 2018-09-06 (×2): 1 mL/kg/h via INTRAVENOUS

## 2018-09-06 MED ORDER — LIDOCAINE HCL (PF) 1 % IJ SOLN
INTRAMUSCULAR | Status: AC
  Filled 2018-09-06: qty 30

## 2018-09-06 MED ORDER — VERAPAMIL HCL 2.5 MG/ML IV SOLN
INTRAVENOUS | Status: DC | PRN
  Administered 2018-09-06: 10 mL via INTRA_ARTERIAL

## 2018-09-06 MED ORDER — MIDAZOLAM HCL 2 MG/2ML IJ SOLN
INTRAMUSCULAR | Status: DC | PRN
  Administered 2018-09-06: 1 mg via INTRAVENOUS

## 2018-09-06 MED ORDER — SODIUM CHLORIDE 0.9% FLUSH: 3.0000 mL | INTRAVENOUS | Status: DC | PRN

## 2018-09-06 MED ORDER — NITROGLYCERIN IN D5W 200-5 MCG/ML-% IV SOLN
0.0000 ug/min | INTRAVENOUS | Status: DC
  Administered 2018-09-06: 10 ug/min via INTRAVENOUS
  Filled 2018-09-06: qty 250

## 2018-09-06 MED ORDER — IOHEXOL 350 MG/ML SOLN
INTRAVENOUS | Status: DC | PRN
  Administered 2018-09-06: 50 mL via INTRA_ARTERIAL

## 2018-09-06 SURGICAL SUPPLY — 12 items
CATH 5FR JL3.5 JR4 ANG PIG MP (CATHETERS) ×2 IMPLANT
CATH LAUNCHER 6FR EBU3.5 (CATHETERS) ×2 IMPLANT
DEVICE RAD COMP TR BAND LRG (VASCULAR PRODUCTS) ×2 IMPLANT
GLIDESHEATH SLEND SS 6F .021 (SHEATH) ×2 IMPLANT
GUIDEWIRE INQWIRE 1.5J.035X260 (WIRE) ×1 IMPLANT
INQWIRE 1.5J .035X260CM (WIRE) ×2
KIT ENCORE 26 ADVANTAGE (KITS) ×2 IMPLANT
KIT HEART LEFT (KITS) ×2 IMPLANT
PACK CARDIAC CATHETERIZATION (CUSTOM PROCEDURE TRAY) ×2 IMPLANT
TRANSDUCER W/STOPCOCK (MISCELLANEOUS) ×2 IMPLANT
TUBING CIL FLEX 10 FLL-RA (TUBING) ×2 IMPLANT
WIRE HI TORQ WHISPER MS 190CM (WIRE) ×2 IMPLANT

## 2018-09-06 NOTE — Interval H&P Note (Signed)
History and Physical Interval Note:  09/06/2018 9:35 AM  Judith ShorterBarbara Flores  has presented today for surgery, with the diagnosis of NSTEMI  The various methods of treatment have been discussed with the patient and family. After consideration of risks, benefits and other options for treatment, the patient has consented to  Procedure(s): LEFT HEART CATH AND CORONARY ANGIOGRAPHY (N/A) as a surgical intervention .  The patient's history has been reviewed, patient examined, no change in status, stable for surgery.  I have reviewed the patient's chart and labs.  Questions were answered to the patient's satisfaction.     Tonny BollmanMichael Catcher Dehoyos

## 2018-09-06 NOTE — Progress Notes (Signed)
CRITICAL VALUE ALERT  Critical Value:  ptt greater than 200  Date & Time Notied:  09/06/18 12:10  Provider Notified: Dr. Herbie BaltimoreHarding  Orders Received/Actions taken: No new orders received

## 2018-09-06 NOTE — Plan of Care (Signed)

## 2018-09-06 NOTE — Progress Notes (Signed)
Notified Dr. Excell Seltzerooper of patient's PTT level, received orders to start patient's heparin infusion.

## 2018-09-06 NOTE — H&P (View-Only) (Signed)
Progress Note  Patient Name: Judith Flores Date of Encounter: 09/06/2018  Primary Cardiologist: Jaynie CrumbleWSTilley Primary Electrophysiologist: SK   Patient Profile     84 y.o. female admitted in transfer with chest pain and found to have + troponin -atypical symptoms for ACS, but with elevated troponin, consider non-STEMI.  Initially plans for noninvasive evaluation with Myoview were canceled after the second troponin resulted as more significantly elevated.  Hx CHB s/p STJ PPM, Afib, GERD, anemia, LE edema.   Subjective   Had transient chest pain yesterday, but has had intermittent chest discomfort overnight, started having discomfort while I was talking with her.  Has had nitroglycerin but also Maalox and is not sure which one relieved it.  Inpatient Medications    Scheduled Meds: . metoprolol tartrate  100 mg Oral Daily   And  . metoprolol tartrate  50 mg Oral QHS  . pantoprazole  40 mg Oral BID AC  . regadenoson  0.4 mg Intravenous Once  . sodium chloride flush  3 mL Intravenous Q12H  . sodium chloride flush  3 mL Intravenous Q12H  . sucralfate  1 g Oral TID WC & HS   Continuous Infusions: . sodium chloride    . sodium chloride    . sodium chloride 1 mL/kg/hr (09/06/18 0504)  . heparin 950 Units/hr (09/06/18 0504)   PRN Meds: sodium chloride, sodium chloride, acetaminophen, ALPRAZolam, alum & mag hydroxide-simeth, nitroGLYCERIN, ondansetron (ZOFRAN) IV, sodium chloride flush, sodium chloride flush, zolpidem   Vital Signs    Vitals:   09/05/18 2018 09/06/18 0324 09/06/18 0443 09/06/18 0732  BP: (!) 141/77 118/70 (!) 112/58 121/66  Pulse: 73 74 70 (!) 57  Resp: 16 16 16 14   Temp: 97.6 F (36.4 C) 98 F (36.7 C) 97.6 F (36.4 C) (!) 97.5 F (36.4 C)  TempSrc: Oral Oral Oral Oral  SpO2: 98% 98% 94% 97%  Weight:   93.3 kg   Height:        Intake/Output Summary (Last 24 hours) at 09/06/2018 0826 Last data filed at 09/05/2018 1630 Gross per 24 hour  Intake 960 ml    Output 650 ml  Net 310 ml   Filed Weights   09/05/18 0500 09/05/18 1430 09/06/18 0443  Weight: 93.4 kg 93.6 kg 93.3 kg    Telemetry    VT NS  - Personally Reviewed P-synchronous/ AV  pacing   ECG    No new EKG.  Physical Exam   Physical Exam  Constitutional: She is oriented to person, place, and time. She appears well-developed and well-nourished. No distress (But seemingly uncomfortable when noting chest discomfort).  HENT:  Head: Normocephalic and atraumatic.  Neck: Normal range of motion. Neck supple. No hepatojugular reflux and no JVD present. Carotid bruit is not present.  Cardiovascular: Normal rate, regular rhythm, S1 normal, S2 normal and normal pulses. PMI is not displaced. Exam reveals distant heart sounds. Exam reveals no gallop.  No murmur heard. Pulmonary/Chest: Effort normal and breath sounds normal. No respiratory distress. She has no wheezes.  Abdominal: Soft. Bowel sounds are normal. She exhibits no distension. There is no abdominal tenderness. There is no rebound.  Musculoskeletal: Normal range of motion.        General: No edema.  Neurological: She is alert and oriented to person, place, and time. No cranial nerve deficit.  Psychiatric: She has a normal mood and affect. Her behavior is normal. Judgment and thought content normal.  Vitals reviewed.   Labs    Chemistry  Recent Labs  Lab 09/04/18 1202 09/05/18 0048 09/06/18 0456  NA 141 140 139  K 4.4 3.8 3.5  CL 110 109 107  CO2 22 23 23   GLUCOSE 117* 108* 119*  BUN 35* 31* 26*  CREATININE 1.80* 1.63* 1.62*  CALCIUM 10.1 9.6 9.6  GFRNONAA 26* 29* 29*  GFRAA 30* 34* 34*  ANIONGAP 9 8 9      Hematology Recent Labs  Lab 09/05/18 0048 09/06/18 0240  WBC 7.1 6.5  RBC 3.40* 3.38*  HGB 11.3* 10.9*  HCT 33.3* 32.6*  MCV 97.9 96.4  MCH 33.2 32.2  MCHC 33.9 33.4  RDW 13.6 13.6  PLT 107* 114*    Cardiac Enzymes Recent Labs  Lab 09/04/18 1202 09/04/18 1855 09/05/18 0048  TROPONINI  0.71* 0.81* 0.70*   No results for input(s): TROPIPOC in the last 168 hours.   BNPNo results for input(s): BNP, PROBNP in the last 168 hours.   DDimer No results for input(s): DDIMER in the last 168 hours.   Radiology    Nm Myocar Single W/spect W/wall Motion And Ef  Result Date: 09/05/2018  Defect 1: There is a small defect of moderate severity present in the apex location.  This can be seen with apical thinning or prior infarct.  Rest only images obtained.     Cardiac Studies   TTE 09/04/2018: EF 55% with mild HK of apical anteroseptal apical myocardium.  GR 1 DD.  Relatively normal valves.  Assessment & Plan     NSTEMI: Somewhat atypical sounding chest discomfort, symptoms are more consistent now with angina decubitus overnight.  Remains on IV heparin with Xarelto being on hold.  (Will need to use Plavix if stent placed)  On high-dose beta-blocker  Will need statin pending cath results.  We will dose 325 aspirin today.   With recurrent chest pain overnight, we have asked for her to be moved up in the Cath Lab scheduled for today.   CHB/Pacemaker St [name]: Pacemaker functioning well.   CRI grade 3 -minimize contrast on cath, no LV gram.  Hydration post-cath.   Essential hypertension: On intermittent dosing of metoprolol.  Stable pressures.  Hyperlipidemia: We will need to start statin pending cath results.  Has had intolerance of atorvastatin in the past, we will try rosuvastatin.  History of A. fib, on Xarelto: Xarelto on hold.  Will need to restart post cath (with anemia, would like to avoid prolonged triple therapy if necessary for PCI)  Anemia new -hemoglobin levels 10.9.  Relatively stable.    Signed, Bryan Lemmaavid Aahna Rossa, MD  09/06/2018, 8:26 AM

## 2018-09-06 NOTE — Progress Notes (Addendum)
ANTICOAGULATION CONSULT NOTE  Pharmacy Consult for heparin Indication: chest pain/ACS and atrial fibrillation  Allergies  Allergen Reactions  . Atorvastatin Shortness Of Breath    MUSCLE PAIN  . Niacin     Unknown reaction   . Sulfa Antibiotics Nausea And Vomiting  . Penicillins Hives and Rash    Has patient had a PCN reaction causing immediate rash, facial/tongue/throat swelling, SOB or lightheadedness with hypotension: Yes Has patient had a PCN reaction causing severe rash involving mucus membranes or skin necrosis: Yes Has patient had a PCN reaction that required hospitalization: No Has patient had a PCN reaction occurring within the last 10 years: No If all of the above answers are "NO", then may proceed with Cephalosporin use.    Patient Measurements: Height: 5\' 6"  (167.6 cm) Weight: 208 lb 12.4 oz (94.7 kg) IBW/kg (Calculated) : 59.3 Heparin Dosing Weight: 81.9  Vital Signs: Temp: 97.8 F (36.6 C) (12/26 1040) Temp Source: Oral (12/26 1040) BP: 112/63 (12/26 1130) Pulse Rate: 75 (12/26 1130)  Labs: Recent Labs    09/04/18 1202 09/04/18 1855 09/04/18 2150 09/05/18 0048 09/06/18 0240 09/06/18 0456 09/06/18 1050  HGB  --   --   --  11.3* 10.9*  --   --   HCT  --   --   --  33.3* 32.6*  --   --   PLT  --   --   --  107* 114*  --   --   APTT  --   --  >200*  --  126*  --   --   HEPARINUNFRC  --   --  >2.20*  --  1.70*  --  1.90*  CREATININE 1.80*  --   --  1.63*  --  1.62*  --   TROPONINI 0.71* 0.81*  --  0.70*  --   --   --    Estimated Creatinine Clearance: 31.1 mL/min (A) (by C-G formula based on SCr of 1.62 mg/dL (H)).  Assessment: 84 yo [place] with chest pain/concern for NSTEMI. Patient has history of atrial fibrillation and on PTA Xarelto 20 mg daily, last dose 12/24 1900. Xarelto held since then, and pharmacy has been consulted for heparin dosing.   Patient s/p cardiac cath this morning (sheath removal ~1030) after being moved up  the cath lab schedule due to recurrent chest pain overnight. Heparin level and aPTT drawn after cath elevated due to heparin used during procedure (labs were originally ordered after heparin infusion rate change overnight and not discontinued/retimed after procedure was rescheduled for earlier time today). Confirmed with RN patietn's heparin infusion stopped before cath (just not documented on MAR). CBC low stable and no documented acute bleeding after cath procedure. Will restart heparin ~4 hours after sheath removal at previous rate.   Goal of Therapy:  Heparin level 0.3-0.7 units/ml aPTT 66-102 seconds Monitor platelets by anticoagulation protocol: Yes   Plan:  Restart heparin at 950 units/hr on 09/06/18 at 3pm Check aPTT and heparin level 8 hours after infusion starts Daily HL, aPTT and CBC Monitor for s/sx bleeding   Harlow MaresAmy Avrey Flanagin, PharmD PGY1 Pharmacy Resident Phone [phone]  09/06/2018   11:59 AM

## 2018-09-06 NOTE — Progress Notes (Addendum)
Progress Note  Patient Name: Judith Flores Date of Encounter: 09/06/2018  Primary Cardiologist: Jaynie CrumbleWSTilley Primary Electrophysiologist: SK   Patient Profile     84 y.o. female admitted in transfer with chest pain and found to have + troponin -atypical symptoms for ACS, but with elevated troponin, consider non-STEMI.  Initially plans for noninvasive evaluation with Myoview were canceled after the second troponin resulted as more significantly elevated.  Hx CHB s/p STJ PPM, Afib, GERD, anemia, LE edema.   Subjective   Had transient chest pain yesterday, but has had intermittent chest discomfort overnight, started having discomfort while I was talking with her.  Has had nitroglycerin but also Maalox and is not sure which one relieved it.  Inpatient Medications    Scheduled Meds: . metoprolol tartrate  100 mg Oral Daily   And  . metoprolol tartrate  50 mg Oral QHS  . pantoprazole  40 mg Oral BID AC  . regadenoson  0.4 mg Intravenous Once  . sodium chloride flush  3 mL Intravenous Q12H  . sodium chloride flush  3 mL Intravenous Q12H  . sucralfate  1 g Oral TID WC & HS   Continuous Infusions: . sodium chloride    . sodium chloride    . sodium chloride 1 mL/kg/hr (09/06/18 0504)  . heparin 950 Units/hr (09/06/18 0504)   PRN Meds: sodium chloride, sodium chloride, acetaminophen, ALPRAZolam, alum & mag hydroxide-simeth, nitroGLYCERIN, ondansetron (ZOFRAN) IV, sodium chloride flush, sodium chloride flush, zolpidem   Vital Signs    Vitals:   09/05/18 2018 09/06/18 0324 09/06/18 0443 09/06/18 0732  BP: (!) 141/77 118/70 (!) 112/58 121/66  Pulse: 73 74 70 (!) 57  Resp: 16 16 16 14   Temp: 97.6 F (36.4 C) 98 F (36.7 C) 97.6 F (36.4 C) (!) 97.5 F (36.4 C)  TempSrc: Oral Oral Oral Oral  SpO2: 98% 98% 94% 97%  Weight:   93.3 kg   Height:        Intake/Output Summary (Last 24 hours) at 09/06/2018 0826 Last data filed at 09/05/2018 1630 Gross per 24 hour  Intake 960 ml    Output 650 ml  Net 310 ml   Filed Weights   09/05/18 0500 09/05/18 1430 09/06/18 0443  Weight: 93.4 kg 93.6 kg 93.3 kg    Telemetry    VT NS  - Personally Reviewed P-synchronous/ AV  pacing   ECG    No new EKG.  Physical Exam   Physical Exam  Constitutional: She is oriented to person, place, and time. She appears well-developed and well-nourished. No distress (But seemingly uncomfortable when noting chest discomfort).  HENT:  Head: Normocephalic and atraumatic.  Neck: Normal range of motion. Neck supple. No hepatojugular reflux and no JVD present. Carotid bruit is not present.  Cardiovascular: Normal rate, regular rhythm, S1 normal, S2 normal and normal pulses. PMI is not displaced. Exam reveals distant heart sounds. Exam reveals no gallop.  No murmur heard. Pulmonary/Chest: Effort normal and breath sounds normal. No respiratory distress. She has no wheezes.  Abdominal: Soft. Bowel sounds are normal. She exhibits no distension. There is no abdominal tenderness. There is no rebound.  Musculoskeletal: Normal range of motion.        General: No edema.  Neurological: She is alert and oriented to person, place, and time. No cranial nerve deficit.  Psychiatric: She has a normal mood and affect. Her behavior is normal. Judgment and thought content normal.  Vitals reviewed.   Labs    Chemistry  Recent Labs  Lab 09/04/18 1202 09/05/18 0048 09/06/18 0456  NA 141 140 139  K 4.4 3.8 3.5  CL 110 109 107  CO2 22 23 23   GLUCOSE 117* 108* 119*  BUN 35* 31* 26*  CREATININE 1.80* 1.63* 1.62*  CALCIUM 10.1 9.6 9.6  GFRNONAA 26* 29* 29*  GFRAA 30* 34* 34*  ANIONGAP 9 8 9      Hematology Recent Labs  Lab 09/05/18 0048 09/06/18 0240  WBC 7.1 6.5  RBC 3.40* 3.38*  HGB 11.3* 10.9*  HCT 33.3* 32.6*  MCV 97.9 96.4  MCH 33.2 32.2  MCHC 33.9 33.4  RDW 13.6 13.6  PLT 107* 114*    Cardiac Enzymes Recent Labs  Lab 09/04/18 1202 09/04/18 1855 09/05/18 0048  TROPONINI  0.71* 0.81* 0.70*   No results for input(s): TROPIPOC in the last 168 hours.   BNPNo results for input(s): BNP, PROBNP in the last 168 hours.   DDimer No results for input(s): DDIMER in the last 168 hours.   Radiology    Nm Myocar Single W/spect W/wall Motion And Ef  Result Date: 09/05/2018  Defect 1: There is a small defect of moderate severity present in the apex location.  This can be seen with apical thinning or prior infarct.  Rest only images obtained.     Cardiac Studies   TTE 09/04/2018: EF 55% with mild HK of apical anteroseptal apical myocardium.  GR 1 DD.  Relatively normal valves.  Assessment & Plan     NSTEMI: Somewhat atypical sounding chest discomfort, symptoms are more consistent now with angina decubitus overnight.  Remains on IV heparin with Xarelto being on hold.  (Will need to use Plavix if stent placed)  On high-dose beta-blocker  Will need statin pending cath results.  We will dose 325 aspirin today.   With recurrent chest pain overnight, we have asked for her to be moved up in the Cath Lab scheduled for today.   CHB/Pacemaker St [name]: Pacemaker functioning well.   CRI grade 3 -minimize contrast on cath, no LV gram.  Hydration post-cath.   Essential hypertension: On intermittent dosing of metoprolol.  Stable pressures.  Hyperlipidemia: We will need to start statin pending cath results.  Has had intolerance of atorvastatin in the past, we will try rosuvastatin.  History of A. Fib --multiple mode switches noted on pacer evaluation would suggest chronic persistent A. fib although not able to tell because of AV pacing on monitor.  on Xarelto: Xarelto on hold.  Will need to restart post cath (with anemia, would like to avoid prolonged triple therapy if necessary for PCI)  Anemia new -hemoglobin levels 10.9.  Relatively stable.    Signed, Bryan Lemmaavid , MD  09/06/2018, 8:26 AM

## 2018-09-06 NOTE — Progress Notes (Signed)
TR band removed per orders, site clean dry and intact, will continue to monitor.

## 2018-09-06 NOTE — Consult Note (Signed)
[address]       [name] [address]             [phone]      Cardiothoracic Surgery Consultation  Reason for Consult: Severe multi-vessel coronary artery diseased Referring Physician: Dr. Tonny Bollman  Judith Flores is an 84 y.o. female.  HPI:   The patient is an 84 year old woman with a history of HTN, atrial fib on Xarelto, Stage III CKD with creatinine of 1.6-1.8, CHB s/p PPM with 2 atrial leads and 2 RV leads in heart who presented with a two month history of intermittent lower substernal and epigastric pain that she thought might be heartburn. She was seen by her PCP and had negative RUQ [place]. Her PPI was increased which did not help. She began having the pain with activity. On the night prior to admission she developed severe pain when she laid down for bed and presented to [facility] ER where a GI cocktail relieved the pain. Troponin was elevated and transferred to [facility]. Troponin rose further. Echo showed an EF of 50-55% with mild hypokinesis of the apical anteroseptal and apical myocardium. Cardiac cath today shows severe 3-vessel CAD with a complex 95% proximal LAD stenosis, 95% ostial Ramus stenosis, and 70% mid RCA stenosis. The LCX has 30% eccentric proximal stenosis. The patient had some chest pain this am.   Past Medical History:  Diagnosis Date  . Anemia   . Atrial fibrillation (HCC)   . CKD (chronic kidney disease), stage III (HCC) 09/04/2018  . Complete heart block (HCC)    intermittent  . Epigastric pain 09/04/2018  . GERD (gastroesophageal reflux disease)   . HOH (hard of hearing)   . HTN (hypertension)   . Pacemaker   . Peripheral edema   . Recurrent upper respiratory infection (URI)    HX OF CHRONIC BRONCHITIS  . Syncope     Past Surgical History:  Procedure Laterality Date  . breast tumor    . INSERT / REPLACE / REMOVE PACEMAKER     [name]  . LEFT HEART CATH AND CORONARY ANGIOGRAPHY N/A 09/06/2018   Procedure: LEFT HEART CATH  AND CORONARY ANGIOGRAPHY;  Surgeon: Tonny Bollman, MD;  Location: [facility] INVASIVE CV LAB;  Service: Cardiovascular;  Laterality: N/A;  . PACEMAKER REVISION N/A 12/01/2011   Procedure: PACEMAKER REVISION;  Surgeon: [name], MD;  Location: [facility] CATH LAB;  Service: Cardiovascular;  Laterality: N/A;  . PPM GENERATOR CHANGEOUT N/A 01/12/2018   Procedure: PPM GENERATOR CHANGEOUT;  Surgeon: [name], MD;  Location: [facility] INVASIVE CV LAB;  Service: Cardiovascular;  Laterality: N/A;    Family History  Problem Relation Age of Onset  . Heart disease Mother   . Heart disease Father     Social History:  reports that she has never smoked. She has never used smokeless tobacco. She reports that she does not drink alcohol or use drugs.  Allergies:  Allergies  Allergen Reactions  . Atorvastatin Shortness Of Breath    MUSCLE PAIN  . Niacin     Unknown reaction   . Sulfa Antibiotics Nausea And Vomiting  . Penicillins Hives and Rash    Has patient had a PCN reaction causing immediate rash, facial/tongue/throat swelling, SOB or lightheadedness with hypotension: Yes Has patient had a PCN reaction causing severe rash involving mucus membranes or skin necrosis: Yes Has patient had a PCN reaction that required hospitalization: No Has patient had a PCN reaction occurring  within the last 10 years: No If all of the above answers are "NO", then may proceed with Cephalosporin use.     Medications:  I have reviewed the patient's current medications. Prior to Admission:  Medications Prior to Admission  Medication Sig Dispense Refill Last Dose  . Cyanocobalamin (VITAMIN B-12 IJ) Inject 1,000 mg as directed every 30 (thirty) days.    Past Month at Unknown time  . ferrous sulfate 325 (65 FE) MG EC tablet Take 1 tablet (325 mg total) by mouth 3 (three) times daily with meals. (Patient taking differently: Take 325 mg by mouth daily. ) 30 tablet 11 09/03/2018 at Unknown time  . furosemide (LASIX) 20 MG tablet  Take 20 mg by mouth as needed for edema.   Past Week at Unknown time  . hydrochlorothiazide (HYDRODIURIL) 25 MG tablet Take 1/2 tablet by mouth once daily (Patient taking differently: Take 25 mg by mouth daily. ) 30 tablet 6 09/03/2018 at Unknown time  . losartan (COZAAR) 100 MG tablet Take 100 mg by mouth daily.   09/03/2018 at Unknown time  . metoprolol (LOPRESSOR) 50 MG tablet Take 1 tablet (50 mg total) by mouth 2 (two) times daily. (Patient taking differently: Take 50-100 mg by mouth See admin instructions. 100mg  (2 pills) in AM 50mg  (1 pill) in PM) 60 tablet 11 09/03/2018 at 7pm  . omeprazole (PRILOSEC) 20 MG capsule Take 20 mg by mouth 2 (two) times daily.    09/03/2018 at Unknown time  . potassium chloride SA (K-DUR,KLOR-CON) 20 MEQ tablet Take 20 mEq by mouth daily.   09/03/2018 at Unknown time  . Rivaroxaban (XARELTO) 20 MG TABS Take 20 mg by mouth daily. 30 tablet 6 09/03/2018 at am  . Skin Protectants, Misc. (EUCERIN) cream Apply 1 application topically as needed (eczema).   09/03/2018 at Unknown time   Scheduled: . metoprolol tartrate  100 mg Oral Daily   And  . metoprolol tartrate  50 mg Oral QHS  . pantoprazole  40 mg Oral BID AC  . regadenoson  0.4 mg Intravenous Once  . sodium chloride flush  3 mL Intravenous Q12H  . sodium chloride flush  3 mL Intravenous Q12H  . sucralfate  1 g Oral TID WC & HS   Continuous: . sodium chloride    . sodium chloride    . sodium chloride 1 mL/kg/hr (09/06/18 1135)  . heparin    . nitroGLYCERIN 10 mcg/min (09/06/18 1203)  . nitroGLYCERIN     ZOX:WRUEAV chloride, sodium chloride, acetaminophen, ALPRAZolam, alum & mag hydroxide-simeth, nitroGLYCERIN, ondansetron (ZOFRAN) IV, sodium chloride flush, sodium chloride flush, zolpidem Anti-infectives (From admission, onward)   None      Results for orders placed or performed during the hospital encounter of 09/04/18 (from the past 48 hour(s))  Troponin I - Now Then Q6H     Status: Abnormal    Collection Time: 09/04/18  6:55 PM  Result Value Ref Range   Troponin I 0.81 (HH) <0.03 ng/mL    Comment: CRITICAL VALUE NOTED.  VALUE IS CONSISTENT WITH PREVIOUSLY REPORTED AND CALLED VALUE. Performed at [facility], [address] [place], [place] [address]   Heparin level (unfractionated)     Status: Abnormal   Collection Time: 09/04/18  9:50 PM  Result Value Ref Range   Heparin Unfractionated >2.20 (H) 0.30 - 0.70 IU/mL    Comment: RESULTS CONFIRMED BY MANUAL DILUTION (NOTE) If heparin results are below expected values, and patient dosage has  been confirmed,  suggest follow up testing of antithrombin III levels. Performed at [facility], [address] [place], [place] [address]   APTT     Status: Abnormal   Collection Time: 09/04/18  9:50 PM  Result Value Ref Range   aPTT >200 (HH) 24 - 36 seconds    Comment:        IF BASELINE aPTT IS ELEVATED, SUGGEST PATIENT RISK ASSESSMENT BE USED TO DETERMINE APPROPRIATE ANTICOAGULANT THERAPY. REPEATED TO VERIFY CRITICAL RESULT CALLED TO, READ BACK BY AND VERIFIED WITH: D.NIEMELA,RN 2330 09/04/18 M.CAMPBELL Performed at [facility], [address] [place], [place] [address]   Troponin I - Now Then Q6H     Status: Abnormal   Collection Time: 09/05/18 12:48 AM  Result Value Ref Range   Troponin I 0.70 (HH) <0.03 ng/mL    Comment: CRITICAL VALUE NOTED.  VALUE IS CONSISTENT WITH PREVIOUSLY REPORTED AND CALLED VALUE. Performed at [facility], [address] [place], [place] [address]   CBC     Status: Abnormal   Collection Time: 09/05/18 12:48 AM  Result Value Ref Range   WBC 7.1 4.0 - 10.5 K/uL   RBC 3.40 (L) 3.87 - 5.11 MIL/uL   Hemoglobin 11.3 (L) 12.0 - 15.0 g/dL   HCT 66.433.3 (L) 40.336.0 - 47.446.0 %   MCV 97.9 80.0 - 100.0 fL   MCH 33.2 26.0 - 34.0 pg   MCHC 33.9 30.0 - 36.0 g/dL   RDW 25.913.6 56.311.5 - 87.515.5 %   Platelets 107 (L) 150 - 400 K/uL    Comment: REPEATED TO VERIFY PLATELET COUNT CONFIRMED BY  SMEAR Immature Platelet Fraction may be clinically indicated, consider ordering this additional test [id number]    nRBC 0.0 0.0 - 0.2 %    Comment: Performed at [facility], [address] [place], [place] [address]  Lipid panel     Status: Abnormal   Collection Time: 09/05/18 12:48 AM  Result Value Ref Range   Cholesterol 193 0 - 200 mg/dL   Triglycerides 606187 (H) <150 mg/dL   HDL 31 (L) >30>40 mg/dL   Total CHOL/HDL Ratio 6.2 RATIO   VLDL 37 0 - 40 mg/dL   LDL Cholesterol 160125 (H) 0 - 99 mg/dL    Comment:        Total Cholesterol/HDL:CHD Risk Coronary Heart Disease Risk Table                     Men   Women  1/2 Average Risk   3.4   3.3  Average Risk       5.0   4.4  2 X Average Risk   9.6   7.1  3 X Average Risk  23.4   11.0        Use the calculated Patient Ratio above and the CHD Risk Table to determine the patient's CHD Risk.        ATP III CLASSIFICATION (LDL):  <100     mg/dL   Optimal  [id number]  mg/dL   Near or Above                    Optimal  130-159  mg/dL   Borderline  [phone]  mg/dL   High  >025>190     mg/dL   Very High Performed at [facility], [address] [place], [place] [address]   Basic metabolic panel     Status: Abnormal  Collection Time: 09/05/18 12:48 AM  Result Value Ref Range   Sodium 140 135 - 145 mmol/L   Potassium 3.8 3.5 - 5.1 mmol/L   Chloride 109 98 - 111 mmol/L   CO2 23 22 - 32 mmol/L   Glucose, Bld 108 (H) 70 - 99 mg/dL   BUN 31 (H) 8 - 23 mg/dL   Creatinine, Ser 1.61 (H) 0.44 - 1.00 mg/dL   Calcium 9.6 8.9 - 09.6 mg/dL   GFR calc non Af Amer 29 (L) >60 mL/min   GFR calc Af Amer 34 (L) >60 mL/min   Anion gap 8 5 - 15    Comment: Performed at [facility], [address], [place], [place] [address]  Ferritin     Status: None   Collection Time: 09/05/18  8:54 AM  Result Value Ref Range   Ferritin 169 11 - 307 ng/mL    Comment: Performed at [facility], [address] [place], [place] [address]  MRSA PCR  Screening     Status: None   Collection Time: 09/05/18  9:04 AM  Result Value Ref Range   MRSA by PCR NEGATIVE NEGATIVE    Comment:        The GeneXpert MRSA Assay (FDA approved for NASAL specimens only), is one component of a comprehensive MRSA colonization surveillance program. It is not intended to diagnose MRSA infection nor to guide or monitor treatment for MRSA infections. Performed at [facility], [address] [place], [place] [address]   APTT     Status: Abnormal   Collection Time: 09/06/18  2:40 AM  Result Value Ref Range   aPTT 126 (H) 24 - 36 seconds    Comment:        IF BASELINE aPTT IS ELEVATED, SUGGEST PATIENT RISK ASSESSMENT BE USED TO DETERMINE APPROPRIATE ANTICOAGULANT THERAPY. Performed at [facility], [address], [place], [place] [address]   Heparin level (unfractionated)     Status: Abnormal   Collection Time: 09/06/18  2:40 AM  Result Value Ref Range   Heparin Unfractionated 1.70 (H) 0.30 - 0.70 IU/mL    Comment: RESULTS CONFIRMED BY MANUAL DILUTION Performed at [facility], [address], [place], [place] [address]   Iron and TIBC     Status: None   Collection Time: 09/06/18  2:40 AM  Result Value Ref Range   Iron 67 28 - 170 ug/dL   TIBC 784 696 - 295 ug/dL   Saturation Ratios 25 10.4 - 31.8 %   UIBC 200 ug/dL    Comment: Performed at [facility], [address], [place], [place] [address]  CBC     Status: Abnormal   Collection Time: 09/06/18  2:40 AM  Result Value Ref Range   WBC 6.5 4.0 - 10.5 K/uL   RBC 3.38 (L) 3.87 - 5.11 MIL/uL   Hemoglobin 10.9 (L) 12.0 - 15.0 g/dL   HCT 24.4 (L) 01.0 - 27.2 %   MCV 96.4 80.0 - 100.0 fL   MCH 32.2 26.0 - 34.0 pg   MCHC 33.4 30.0 - 36.0 g/dL   RDW 53.6 64.4 - 03.4 %   Platelets 114 (L) 150 - 400 K/uL    Comment: REPEATED TO VERIFY Immature Platelet Fraction may be clinically indicated, consider ordering this additional test [id number] CONSISTENT WITH PREVIOUS  RESULT    nRBC 0.0 0.0 - 0.2 %    Comment: Performed at [facility], [address] [place], [place] [address]  Basic metabolic panel     Status: Abnormal   Collection Time: 09/06/18  4:56 AM  Result Value Ref Range   Sodium 139 135 - 145 mmol/L   Potassium 3.5 3.5 - 5.1 mmol/L   Chloride 107 98 - 111 mmol/L   CO2 23 22 - 32 mmol/L   Glucose, Bld 119 (H) 70 - 99 mg/dL   BUN 26 (H) 8 - 23 mg/dL   Creatinine, Ser 6.04 (H) 0.44 - 1.00 mg/dL   Calcium 9.6 8.9 - 54.0 mg/dL   GFR calc non Af Amer 29 (L) >60 mL/min   GFR calc Af Amer 34 (L) >60 mL/min   Anion gap 9 5 - 15    Comment: Performed at [facility], [address] [place], [place] [address]  Heparin level (unfractionated)     Status: Abnormal   Collection Time: 09/06/18 10:50 AM  Result Value Ref Range   Heparin Unfractionated 1.90 (H) 0.30 - 0.70 IU/mL    Comment: RESULTS CONFIRMED BY MANUAL DILUTION (NOTE) If heparin results are below expected values, and patient dosage has  been confirmed, suggest follow up testing of antithrombin III levels. Performed at [facility], [address] [place], [place] [address]   APTT     Status: Abnormal   Collection Time: 09/06/18 10:50 AM  Result Value Ref Range   aPTT >200 (HH) 24 - 36 seconds    Comment:        IF BASELINE aPTT IS ELEVATED, SUGGEST PATIENT RISK ASSESSMENT BE USED TO DETERMINE APPROPRIATE ANTICOAGULANT THERAPY. RESULT REPEATED AND VERIFIED CRITICAL RESULT CALLED TO, READ BACK BY AND VERIFIED WITH: M.DIMARIA,RN 1202 09/06/18 CLARK,S Performed at [facility], [address] [place], [place] [address]     Nm Myocar Single W/spect W/wall Motion And Ef  Result Date: 09/05/2018  Defect 1: There is a small defect of moderate severity present in the apex location.  This can be seen with apical thinning or prior infarct.  Rest only images obtained.     Review of Systems  Constitutional: Positive for malaise/fatigue.  HENT:  Negative.   Eyes: Negative.   Respiratory: Positive for shortness of breath.   Cardiovascular: Positive for chest pain. Negative for orthopnea and leg swelling.  Gastrointestinal: Negative.   Genitourinary: Negative.   Musculoskeletal: Negative.   Skin: Negative.   Neurological: Negative.   Endo/Heme/Allergies: Negative.   Psychiatric/Behavioral: Negative.    Blood pressure 104/78, pulse 75, temperature 97.8 F (36.6 C), temperature source Oral, resp. rate 19, height 5\' 6"  (1.676 m), weight 94.7 kg, SpO2 98 %. Physical Exam  Constitutional: She is oriented to person, place, and time.  Moderately obese elderly woman in no distress.  Eyes: Pupils are equal, round, and reactive to light. EOM are normal.  Neck: Normal range of motion. Neck supple. No JVD present.  Cardiovascular: Normal rate, regular rhythm and normal heart sounds.  No murmur heard. Respiratory: Effort normal and breath sounds normal.  Neurological: She is oriented to person, place, and time.   Physicians   Panel Physicians Referring Physician Case Authorizing Physician  Tonny Bollman, MD (Primary)    Procedures   LEFT HEART CATH AND CORONARY ANGIOGRAPHY  Conclusion     Mid RCA lesion is 70% stenosed.  Dist RCA lesion is 40% stenosed.  Prox Cx lesion is 30% stenosed with 30% stenosed side branch in Ost 1st Mrg.  Ost 1st  Diag lesion is 100% stenosed.  Ost 2nd Diag to 2nd Diag lesion is 40% stenosed.  Prox LAD to Mid LAD lesion is 95% stenosed.  Ost Ramus to Ramus lesion is 95% stenosed.   1.  Severe three-vessel coronary artery disease with long complex proximal and mid LAD stenosis with subtotal occlusion, severe stenosis of a large ramus intermedius branch, and moderately severe mid RCA stenosis 2.  Mild segmental LV systolic dysfunction with LVEF 50 to 55% by echo assessment  Recommendations: Cardiac surgery consultation for consideration of CABG.  The patient is 35 with stage III chronic kidney  disease.  She otherwise is independent and lives alone.  If she is felt to be a poor candidate for CABG, I would be inclined to treat her LAD and ramus intermedius with PCI during this hospitalization.  Could consider staged PCI versus medical therapy of the RCA as it is less severe.     Indications   Non-ST elevation (NSTEMI) myocardial infarction (HCC) [I21.4 (ICD-10-CM)]   Coronary Findings   Diagnostic  Dominance: Right  Left Anterior Descending  Prox LAD to Mid LAD lesion 95% stenosed  Prox LAD to Mid LAD lesion is 95% stenosed. The lesion is eccentric and thrombotic. There is a long complex area of stenosis throughout the entire proximal LAD. The mid vessel at the origin of the second diagonal branch is subtotally occluded with thrombus present. There is TIMI II flow beyond the lesion.  First Diagonal Branch  Ost 1st Diag lesion 100% stenosed  Ost 1st Diag lesion is 100% stenosed. Small vessel  Second Diagonal Branch  Ost 2nd Diag to 2nd Diag lesion 40% stenosed  Ost 2nd Diag to 2nd Diag lesion is 40% stenosed.  Ramus Intermedius  Vessel is large.  Ost Ramus to Ramus lesion 95% stenosed  Ost Ramus to Ramus lesion is 95% stenosed. Severe diffuse disease of a large intermediate branch  Left Circumflex  Prox Cx lesion 30% stenosed with side branch in Ost 1st Mrg 30% stenosed  Prox Cx lesion is 30% stenosed with 30% stenosed side branch in Ost 1st Mrg.  Right Coronary Artery  Mid RCA lesion 70% stenosed  Mid RCA lesion is 70% stenosed. The lesion is calcified.  Dist RCA lesion 40% stenosed  Dist RCA lesion is 40% stenosed.  Intervention   No interventions have been documented.  Coronary Diagrams   Diagnostic  Dominance: Right    Intervention   Implants    No implant documentation for this case.  MERGE Images   Show images for CARDIAC CATHETERIZATION   Link to Procedure Log   Procedure Log    Hemo Data    Most Recent Value  AO Systolic Pressure 112 mmHg  AO  Diastolic Pressure 56 mmHg  AO Mean 80 mmHg  LV Systolic Pressure 110 mmHg  LV Diastolic Pressure 8 mmHg  LV EDP 12 mmHg  AOp Systolic Pressure 107 mmHg  AOp Diastolic Pressure 53 mmHg  AOp Mean Pressure 75 mmHg  LVp Systolic Pressure 112 mmHg  LVp Diastolic Pressure 7 mmHg  LVp EDP Pressure 14 mmHg       *[place]*                   *[facility]*                         [address]  [place], [place] [address]                            [phone]  ------------------------------------------------------------------- Transthoracic Echocardiography  Patient:    Ayanni, Tun MR #:       [id number] Study Date: 09/04/2018 Gender:     F Age:        61 Height:     170.2 cm Weight:     93.3 kg BSA:        2.13 m^2 Pt. Status: Room:       4E02C   ORDERING     Barrett, Joline Salt  REFERRING    Barrett, Joline Salt  ATTENDING    Zuriah Bordas Lemma, MD  ADMITTING    Sherryl Manges  PERFORMING   [facility], Inpatient  SONOGRAPHER  Belva Chimes  cc:  ------------------------------------------------------------------- LV EF: 50% -   55%  ------------------------------------------------------------------- Indications:      Chest pain 786.51.  ------------------------------------------------------------------- Study Conclusions  - Left ventricle: The cavity size was normal. Systolic function was   normal. The estimated ejection fraction was in the range of 50%   to 55%. Mild hypokinesis of the apical anteroseptal and apical   myocardium. Doppler parameters are consistent with abnormal left   ventricular relaxation (grade 1 diastolic dysfunction). Doppler   parameters are consistent with indeterminate ventricular filling   pressure. - Aortic valve: Transvalvular velocity was within the normal range.   There was no stenosis. There was no regurgitation. Valve area   (VTI): 2.48 cm^2. Valve area (Vmean): 2.49 cm^2. - Mitral valve:  Transvalvular velocity was within the normal range.   There was no evidence for stenosis. There was no regurgitation. - Right ventricle: The cavity size was normal. Wall thickness was   normal. Systolic function was normal. - Atrial septum: No defect or patent foramen ovale was identified   by color flow Doppler. - Tricuspid valve: There was trivial regurgitation. - Pulmonary arteries: Systolic pressure was within the normal   range. PA peak pressure: 13 mm Hg (S).  ------------------------------------------------------------------- Study data:  No prior study was available for comparison.  Study status:  Routine.  Procedure:  Transthoracic echocardiography. Image quality was adequate.  Study completion:  There were no complications.          Transthoracic echocardiography.  M-mode, complete 2D, spectral Doppler, and color Doppler.  Birthdate: Patient birthdate: Jan 14, 1934.  Age:  Patient is 84 yr old.  Sex: Gender: female.    BMI: 32.2 kg/m^2.  Blood pressure:     130/72 Patient status:  Inpatient.  Study date:  Study date: 09/04/2018. Study time: 02:47 PM.  Location:  Bedside.  -------------------------------------------------------------------  ------------------------------------------------------------------- Left ventricle:  The cavity size was normal. Systolic function was normal. The estimated ejection fraction was in the range of 50% to 55%.  Regional wall motion abnormalities:  Mild hypokinesis of the apical anteroseptal and apical myocardium. Doppler parameters are consistent with abnormal left ventricular relaxation (grade 1 diastolic dysfunction). Doppler parameters are consistent with indeterminate ventricular filling pressure.  ------------------------------------------------------------------- Aortic valve:   Trileaflet; mildly thickened, mildly calcified leaflets. Mobility was not restricted.  Doppler:  Transvalvular velocity was within the normal range. There  was no stenosis. There was no regurgitation.    VTI ratio of LVOT to aortic valve: 0.79. Valve area (VTI): 2.48 cm^2. Indexed valve area (VTI): 1.16 cm^2/m^2. Mean velocity ratio of LVOT to aortic valve: 0.79. Valve area (Vmean): 2.49 cm^2. Indexed valve area (  Vmean): 1.17 cm^2/m^2.    Mean gradient (S): 3 mm Hg.  ------------------------------------------------------------------- Aorta:  Aortic root: The aortic root was normal in size.  ------------------------------------------------------------------- Mitral valve:   Structurally normal valve.   Mobility was not restricted.  Doppler:  Transvalvular velocity was within the normal range. There was no evidence for stenosis. There was no regurgitation.  ------------------------------------------------------------------- Left atrium:  The atrium was normal in size.  ------------------------------------------------------------------- Atrial septum:  No defect or patent foramen ovale was identified by color flow Doppler.  ------------------------------------------------------------------- Right ventricle:  The cavity size was normal. Wall thickness was normal. Systolic function was normal.  ------------------------------------------------------------------- Pulmonic valve:    Structurally normal valve.   Cusp separation was normal.  Doppler:  Transvalvular velocity was within the normal range. There was no evidence for stenosis. There was no regurgitation.  ------------------------------------------------------------------- Tricuspid valve:   Structurally normal valve.    Doppler: Transvalvular velocity was within the normal range. There was trivial regurgitation.  ------------------------------------------------------------------- Pulmonary artery:   The main pulmonary artery was normal-sized. Systolic pressure was within the normal range.  ------------------------------------------------------------------- Right  atrium:  The atrium was normal in size.  ------------------------------------------------------------------- Pericardium:  There was no pericardial effusion.  ------------------------------------------------------------------- Systemic veins: Inferior vena cava: The vessel was normal in size. The respirophasic diameter changes were in the normal range (= 50%), consistent with normal central venous pressure.  ------------------------------------------------------------------- Measurements   Left ventricle                            Value          Reference  LV ID, ED, PLAX chordal                   43.6  mm       43 - 52  LV ID, ES, PLAX chordal                   31.1  mm       23 - 38  LV fx shortening, PLAX chordal            29    %        >=29  LV PW thickness, ED                       9.53  mm       ---------  IVS/LV PW ratio, ED                       0.95           <=1.3  Stroke volume, 2D                         61    ml       ---------  Stroke volume/bsa, 2D                     29    ml/m^2   ---------  LV end-diastolic volume, 1-p A4C          85    ml       ---------  LV end-systolic volume, 1-p A4C           42    ml       ---------  LV ejection fraction, 1-p A4C  51    %        ---------  LV end-diastolic volume/bsa, 1-p          40    ml/m^2   ---------  A4C  LV end-systolic volume/bsa, 1-p           20    ml/m^2   ---------  A4C  LV end-diastolic volume, 2-p              85    ml       ---------  LV end-systolic volume, 2-p               42    ml       ---------  LV ejection fraction, 2-p                 51    %        ---------  Stroke volume, 2-p                        44    ml       ---------  LV end-diastolic volume/bsa, 2-p          40    ml/m^2   ---------  LV end-systolic volume/bsa, 2-p           20    ml/m^2   ---------  Stroke volume/bsa, 2-p                    20.4  ml/m^2   ---------  LV e&', lateral                            5.66  cm/s      ---------  LV E/e&', lateral                          8.32           ---------  LV s&', lateral                            7.18  cm/s     ---------  LV e&', medial                             4.35  cm/s     ---------  LV E/e&', medial                           10.83          ---------  LV e&', average                            5.01  cm/s     ---------  LV E/e&', average                          9.41           ---------    Ventricular septum                        Value          Reference  IVS thickness, ED  9.05  mm       ---------    LVOT                                      Value          Reference  LVOT ID, S                                20    mm       ---------  LVOT area                                 3.14  cm^2     ---------  LVOT mean velocity, S                     64.1  cm/s     ---------  LVOT VTI, S                               19.4  cm       ---------    Aortic valve                              Value          Reference  Aortic valve mean velocity, S             80.7  cm/s     ---------  Aortic valve VTI, S                       24.6  cm       ---------  Aortic mean gradient, S                   3     mm Hg    ---------  VTI ratio, LVOT/AV                        0.79           ---------  Aortic valve area, VTI                    2.48  cm^2     ---------  Aortic valve area/bsa, VTI                1.16  cm^2/m^2 ---------  Velocity ratio, mean, LVOT/AV             0.79           ---------  Aortic valve area, mean velocity          2.49  cm^2     ---------  Aortic valve area/bsa, mean               1.17  cm^2/m^2 ---------  velocity    Aorta                                     Value          Reference  Aortic root ID, ED  32    mm       ---------  Ascending aorta ID, A-P, S                31    mm       ---------    Left atrium                               Value          Reference  LA ID, A-P, ES                             41    mm       ---------  LA ID/bsa, A-P                            1.92  cm/m^2   <=2.2  LA volume, S                              48.5  ml       ---------  LA volume/bsa, S                          22.8  ml/m^2   ---------  LA volume, ES, 1-p A4C                    50.3  ml       ---------  LA volume/bsa, ES, 1-p A4C                23.6  ml/m^2   ---------  LA volume, ES, 1-p A2C                    43.6  ml       ---------  LA volume/bsa, ES, 1-p A2C                20.5  ml/m^2   ---------    Mitral valve                              Value          Reference  Mitral E-wave peak velocity               47.1  cm/s     ---------  Mitral A-wave peak velocity               74.2  cm/s     ---------  Mitral deceleration time          (H)     246   ms       150 - 230  Mitral E/A ratio, peak                    0.6            ---------    Pulmonary arteries                        Value          Reference  PA pressure, S, DP  13    mm Hg    <=30    Tricuspid valve                           Value          Reference  Tricuspid regurg peak velocity            160   cm/s     ---------  Tricuspid peak RV-RA gradient             10    mm Hg    ---------    Systemic veins                            Value          Reference  Estimated CVP                             3     mm Hg    ---------    Right ventricle                           Value          Reference  RV ID, minor axis, ED, A4C base           34.2  mm       ---------  RV ID, minor axis, ED, A4C mid            41.3  mm       ---------  RV ID, major axis, ED, A4C                64.9  mm       55 - 91  TAPSE                                     22.6  mm       ---------  RV pressure, S, DP                        13    mm Hg    <=30  RV s&', lateral, S                         10.9  cm/s     ---------  Legend: (L)  and  (H)  mark values outside specified reference  range.  ------------------------------------------------------------------- Prepared and Electronically Authenticated by  Chilton Siiffany Allamakee, MD 2019-12-24T17:17:21   Assessment/Plan:  This 84 year old woman presented with NSTEMI and unstable angina. Cath shows the culprit to be a complex 95% proximal LAD stenosis and 95% proximal Ramus stenosis with moderate RCA stenosis. Her anatomy is amenable to CABG and I think she is a candidate for CABG although her operative risk is moderate due to her age and stage 3 CKD. She is not interested in CABG surgery and would rather have PCI. I discussed the cath findings with Dr. Excell Seltzerooper and he feels that PCI is an option for her. I reviewed the cath findings, surgical option, benefits and risks of surgery with the patient and her daughter. She wants to proceed with PCI which I think is a reasonable alternative for her.  I spent 45 minutes performing this consultation and > 50% of this time was spent face to face counseling and coordinating the care of this patient's severe multi-vessel coronary artery disease.  Alleen Borne 09/06/2018, 1:36 PM

## 2018-09-06 NOTE — Progress Notes (Signed)
ANTICOAGULATION CONSULT NOTE  Pharmacy Consult for heparin Indication: chest pain/ACS and atrial fibrillation  Allergies  Allergen Reactions  . Atorvastatin Shortness Of Breath    MUSCLE PAIN  . Niacin     Unknown reaction   . Sulfa Antibiotics Nausea And Vomiting  . Penicillins Hives and Rash    Has patient had a PCN reaction causing immediate rash, facial/tongue/throat swelling, SOB or lightheadedness with hypotension: Yes Has patient had a PCN reaction causing severe rash involving mucus membranes or skin necrosis: Yes Has patient had a PCN reaction that required hospitalization: No Has patient had a PCN reaction occurring within the last 10 years: No If all of the above answers are "NO", then may proceed with Cephalosporin use.     Patient Measurements: Height: 5\' 7"  (170.2 cm) Weight: 206 lb 4.8 oz (93.6 kg) IBW/kg (Calculated) : 61.6 Heparin Dosing Weight: 81.9  Vital Signs: Temp: 98 F (36.7 C) (12/26 0324) Temp Source: Oral (12/26 0324) BP: 118/70 (12/26 0324) Pulse Rate: 74 (12/26 0324)  Labs: Recent Labs    09/04/18 1202 09/04/18 1855 09/04/18 2150 09/05/18 0048 09/06/18 0240  HGB  --   --   --  11.3* 10.9*  HCT  --   --   --  33.3* 32.6*  PLT  --   --   --  107* 114*  APTT  --   --  >200*  --  126*  HEPARINUNFRC  --   --  >2.20*  --   --   CREATININE 1.80*  --   --  1.63*  --   TROPONINI 0.71* 0.81*  --  0.70*  --     Estimated Creatinine Clearance: 31.3 mL/min (A) (by C-G formula based on SCr of 1.63 mg/dL (H)).  Assessment: 84 yo Female transferred from [place] with chest pain/ concern for NSTEMI, h/o Afib and Xarelto now on hold, for heparin.   APTT this am 126 sec  Goal of Therapy:  Heparin level 0.3-0.7 units/ml aPTT 66-102 seconds Monitor platelets by anticoagulation protocol: Yes   Plan:  Decrease heparin to 950 units/hr Check aPTT and heparin level 6 hours after rate change Daily HL, aPTT and CBC   Thanks for allowing pharmacy to  be a part of this patient's care.  Talbert CageLora Emil Klassen, PharmD Clinical Pharmacist

## 2018-09-07 ENCOUNTER — Other Ambulatory Visit: Payer: Self-pay

## 2018-09-07 ENCOUNTER — Inpatient Hospital Stay (HOSPITAL_COMMUNITY)
Admission: AD | Disposition: A | Discharge status: Home / Self Care | Payer: Self-pay | Source: Other Acute Inpatient Hospital | Attending: Cardiology

## 2018-09-07 DIAGNOSIS — I2510 Atherosclerotic heart disease of native coronary artery without angina pectoris: Secondary | ICD-10-CM

## 2018-09-07 DIAGNOSIS — I4811 Longstanding persistent atrial fibrillation: Secondary | ICD-10-CM

## 2018-09-07 DIAGNOSIS — I214 Non-ST elevation (NSTEMI) myocardial infarction: Principal | ICD-10-CM

## 2018-09-07 DIAGNOSIS — E785 Hyperlipidemia, unspecified: Secondary | ICD-10-CM

## 2018-09-07 HISTORY — PX: CORONARY STENT INTERVENTION: CATH118234

## 2018-09-07 LAB — BASIC METABOLIC PANEL
Anion gap: 9 (ref 5–15)
BUN: 20 mg/dL (ref 8–23)
CALCIUM: 9.1 mg/dL (ref 8.9–10.3)
CO2: 20 mmol/L — ABNORMAL LOW (ref 22–32)
CREATININE: 1.54 mg/dL — AB (ref 0.44–1.00)
Chloride: 110 mmol/L (ref 98–111)
GFR calc Af Amer: 36 mL/min — ABNORMAL LOW (ref >60)
GFR calc non Af Amer: 31 mL/min — ABNORMAL LOW (ref >60)
Glucose, Bld: 117 mg/dL — ABNORMAL HIGH (ref 70–99)
Potassium: 3.8 mmol/L (ref 3.5–5.1)
Sodium: 139 mmol/L (ref 135–145)

## 2018-09-07 LAB — APTT
APTT: 64 s — AB (ref 24–36)
APTT: 118 s — AB (ref 24–36)

## 2018-09-07 LAB — CBC
HCT: 31.1 % — ABNORMAL LOW (ref 36.0–46.0)
Hemoglobin: 10.5 g/dL — ABNORMAL LOW (ref 12.0–15.0)
MCH: 32.5 pg (ref 26.0–34.0)
MCHC: 33.8 g/dL (ref 30.0–36.0)
MCV: 96.3 fL (ref 80.0–100.0)
Platelets: 103 10*3/uL — ABNORMAL LOW (ref 150–400)
RBC: 3.23 MIL/uL — ABNORMAL LOW (ref 3.87–5.11)
RDW: 13.6 % (ref 11.5–15.5)
WBC: 6.9 10*3/uL (ref 4.0–10.5)
nRBC: 0 % (ref 0.0–0.2)

## 2018-09-07 LAB — POCT ACTIVATED CLOTTING TIME: Activated Clotting Time: 422 seconds

## 2018-09-07 LAB — HEPARIN LEVEL (UNFRACTIONATED): Heparin Unfractionated: 0.87 IU/mL — ABNORMAL HIGH (ref 0.30–0.70)

## 2018-09-07 SURGERY — CORONARY STENT INTERVENTION
Anesthesia: LOCAL

## 2018-09-07 MED ORDER — BIVALIRUDIN TRIFLUOROACETATE 250 MG IV SOLR
INTRAVENOUS | Status: AC
  Filled 2018-09-07: qty 250

## 2018-09-07 MED ORDER — VERAPAMIL HCL 2.5 MG/ML IV SOLN
INTRAVENOUS | Status: DC | PRN
  Administered 2018-09-07: 10 mL via INTRA_ARTERIAL

## 2018-09-07 MED ORDER — CLOPIDOGREL BISULFATE 75 MG PO TABS
600.0000 mg | ORAL_TABLET | Freq: Once | ORAL | Status: AC
  Administered 2018-09-07: 600 mg via ORAL
  Filled 2018-09-07: qty 8

## 2018-09-07 MED ORDER — SODIUM CHLORIDE 0.9% FLUSH
3.0000 mL | Freq: Two times a day (BID) | INTRAVENOUS | Status: DC
  Administered 2018-09-07 (×2): 3 mL via INTRAVENOUS

## 2018-09-07 MED ORDER — NITROGLYCERIN 1 MG/10 ML FOR IR/CATH LAB
INTRA_ARTERIAL | Status: AC
  Filled 2018-09-07: qty 10

## 2018-09-07 MED ORDER — SODIUM CHLORIDE 0.9 % WEIGHT BASED INFUSION
1.0000 mL/kg/h | INTRAVENOUS | Status: DC
  Administered 2018-09-07 (×2): 1 mL/kg/h via INTRAVENOUS

## 2018-09-07 MED ORDER — CLOPIDOGREL BISULFATE 75 MG PO TABS
75.0000 mg | ORAL_TABLET | Freq: Every day | ORAL | Status: DC
  Administered 2018-09-08: 08:00:00 75 mg via ORAL
  Filled 2018-09-07: qty 1

## 2018-09-07 MED ORDER — ANGIOPLASTY BOOK
Freq: Once | Status: AC
  Administered 2018-09-07: 22:00:00
  Filled 2018-09-07: qty 1

## 2018-09-07 MED ORDER — ONDANSETRON HCL 4 MG/2ML IJ SOLN
INTRAMUSCULAR | Status: AC
  Filled 2018-09-07: qty 2

## 2018-09-07 MED ORDER — LABETALOL HCL 5 MG/ML IV SOLN: 10.0000 mg | INTRAVENOUS | Status: AC | PRN

## 2018-09-07 MED ORDER — SODIUM CHLORIDE 0.9 % IV SOLN
INTRAVENOUS | Status: DC | PRN
  Administered 2018-09-07: 1.75 mg/kg/h via INTRAVENOUS

## 2018-09-07 MED ORDER — MIDAZOLAM HCL 2 MG/2ML IJ SOLN
INTRAMUSCULAR | Status: AC
  Filled 2018-09-07: qty 2

## 2018-09-07 MED ORDER — RIVAROXABAN 15 MG PO TABS
15.0000 mg | ORAL_TABLET | Freq: Every day | ORAL | Status: DC
  Administered 2018-09-08: 10:00:00 15 mg via ORAL
  Filled 2018-09-07: qty 1

## 2018-09-07 MED ORDER — LIDOCAINE HCL (PF) 1 % IJ SOLN
INTRAMUSCULAR | Status: DC | PRN
  Administered 2018-09-07: 2 mL via INTRADERMAL

## 2018-09-07 MED ORDER — SODIUM CHLORIDE 0.9% FLUSH: 3.0000 mL | INTRAVENOUS | Status: DC | PRN

## 2018-09-07 MED ORDER — HEART ATTACK BOUNCING BOOK
Freq: Once | Status: AC
  Administered 2018-09-07: 22:00:00
  Filled 2018-09-07: qty 1

## 2018-09-07 MED ORDER — IOHEXOL 350 MG/ML SOLN
INTRAVENOUS | Status: DC | PRN
  Administered 2018-09-07: 125 mL via INTRA_ARTERIAL

## 2018-09-07 MED ORDER — VERAPAMIL HCL 2.5 MG/ML IV SOLN
INTRAVENOUS | Status: AC
  Filled 2018-09-07: qty 2

## 2018-09-07 MED ORDER — FUROSEMIDE 10 MG/ML IJ SOLN
INTRAMUSCULAR | Status: AC
  Filled 2018-09-07: qty 4

## 2018-09-07 MED ORDER — SODIUM CHLORIDE 0.9% FLUSH: 3.0000 mL | Freq: Two times a day (BID) | INTRAVENOUS | Status: DC

## 2018-09-07 MED ORDER — MIDAZOLAM HCL 2 MG/2ML IJ SOLN
INTRAMUSCULAR | Status: DC | PRN
  Administered 2018-09-07: 2 mg via INTRAVENOUS

## 2018-09-07 MED ORDER — ASPIRIN 81 MG PO CHEW
81.0000 mg | CHEWABLE_TABLET | Freq: Every day | ORAL | Status: DC
  Administered 2018-09-08: 81 mg via ORAL
  Filled 2018-09-07: qty 1

## 2018-09-07 MED ORDER — BIVALIRUDIN BOLUS VIA INFUSION - CUPID
INTRAVENOUS | Status: DC | PRN
  Administered 2018-09-07: 70.875 mg via INTRAVENOUS

## 2018-09-07 MED ORDER — HYDRALAZINE HCL 20 MG/ML IJ SOLN: 5.0000 mg | INTRAMUSCULAR | Status: AC | PRN

## 2018-09-07 MED ORDER — LIDOCAINE HCL (PF) 1 % IJ SOLN
INTRAMUSCULAR | Status: AC
  Filled 2018-09-07: qty 30

## 2018-09-07 MED ORDER — FUROSEMIDE 10 MG/ML IJ SOLN
INTRAMUSCULAR | Status: DC | PRN
  Administered 2018-09-07: 40 mg via INTRAVENOUS

## 2018-09-07 MED ORDER — SODIUM CHLORIDE 0.9 % WEIGHT BASED INFUSION
3.0000 mL/kg/h | INTRAVENOUS | Status: DC
  Administered 2018-09-07: 3 mL/kg/h via INTRAVENOUS

## 2018-09-07 MED ORDER — FENTANYL CITRATE (PF) 100 MCG/2ML IJ SOLN
INTRAMUSCULAR | Status: AC
  Filled 2018-09-07: qty 2

## 2018-09-07 MED ORDER — HEPARIN (PORCINE) IN NACL 1000-0.9 UT/500ML-% IV SOLN
INTRAVENOUS | Status: AC
  Filled 2018-09-07: qty 1000

## 2018-09-07 MED ORDER — NITROGLYCERIN 1 MG/10 ML FOR IR/CATH LAB
INTRA_ARTERIAL | Status: DC | PRN
  Administered 2018-09-07: 150 ug via INTRACORONARY

## 2018-09-07 MED ORDER — SODIUM CHLORIDE 0.9 % IV SOLN: 250.0000 mL | INTRAVENOUS | Status: DC | PRN

## 2018-09-07 MED ORDER — ASPIRIN 81 MG PO CHEW
81.0000 mg | CHEWABLE_TABLET | ORAL | Status: AC
  Administered 2018-09-07: 81 mg via ORAL
  Filled 2018-09-07: qty 1

## 2018-09-07 MED ORDER — FENTANYL CITRATE (PF) 100 MCG/2ML IJ SOLN
INTRAMUSCULAR | Status: DC | PRN
  Administered 2018-09-07 (×2): 25 ug via INTRAVENOUS

## 2018-09-07 MED ORDER — VERAPAMIL HCL 2.5 MG/ML IV SOLN
INTRAVENOUS | Status: DC | PRN
  Administered 2018-09-07 (×2): 200 ug via INTRACORONARY
  Administered 2018-09-07 (×2): 100 ug via INTRACORONARY
  Administered 2018-09-07: 200 ug via INTRACORONARY

## 2018-09-07 MED FILL — Lidocaine HCl Local Preservative Free (PF) Inj 1%: INTRAMUSCULAR | Qty: 30 | Status: AC

## 2018-09-07 SURGICAL SUPPLY — 21 items
BALLN SAPPHIRE 2.0X20 (BALLOONS) ×2
BALLN SAPPHIRE ~~LOC~~ 3.0X18 (BALLOONS) ×2 IMPLANT
BALLN ~~LOC~~ EMERGE MR 2.5X20 (BALLOONS) ×2
BALLOON SAPPHIRE 2.0X20 (BALLOONS) ×1 IMPLANT
BALLOON ~~LOC~~ EMERGE MR 2.5X20 (BALLOONS) ×1 IMPLANT
CATH 5FR JL3.5 JR4 ANG PIG MP (CATHETERS) ×2 IMPLANT
CATH LAUNCHER 6FR EBU3.5 (CATHETERS) ×2 IMPLANT
DEVICE RAD COMP TR BAND LRG (VASCULAR PRODUCTS) ×2 IMPLANT
ELECT DEFIB PAD ADLT CADENCE (PAD) ×2 IMPLANT
GLIDESHEATH SLEND SS 6F .021 (SHEATH) ×2 IMPLANT
GUIDEWIRE INQWIRE 1.5J.035X260 (WIRE) ×1 IMPLANT
INQWIRE 1.5J .035X260CM (WIRE) ×2
KIT ENCORE 26 ADVANTAGE (KITS) ×2 IMPLANT
KIT HEART LEFT (KITS) ×2 IMPLANT
PACK CARDIAC CATHETERIZATION (CUSTOM PROCEDURE TRAY) ×2 IMPLANT
STENT RESOLUTE ONYX 2.25X38 (Permanent Stent) ×2 IMPLANT
STENT RESOLUTE ONYX 2.5X38 (Permanent Stent) ×2 IMPLANT
STENT RESOLUTE ONYX 3.5X12 (Permanent Stent) ×2 IMPLANT
TRANSDUCER W/STOPCOCK (MISCELLANEOUS) ×2 IMPLANT
TUBING CIL FLEX 10 FLL-RA (TUBING) ×2 IMPLANT
WIRE HI TORQ WHISPER MS 190CM (WIRE) ×2 IMPLANT

## 2018-09-07 NOTE — H&P (View-Only) (Signed)
Progress Note  Patient Name: Judith Flores Date of Encounter: 09/07/2018  Primary Cardiologist: Jaynie Crumble Primary Electrophysiologist: SK   Patient Profile     84 y.o. female admitted in transfer with chest pain and found to have + troponin -atypical symptoms for ACS, but with elevated troponin, consider non-STEMI.  Initially plans for noninvasive evaluation with Myoview were canceled after the second troponin resulted as more significantly elevated.  Hx CHB s/p STJ PPM, Afib, GERD, anemia, LE edema.   Subjective   Had significant pain yesterday morning prior to cardiac catheterization and during the catheterization.  Was placed on nitroglycerin drip and back on IV heparin post catheterization. Cath showed severe LAD & ramus disease along with moderate RCA disease.  Per protocol CT surgery consult obtained.  Patient decided to go with PCI.  No further chest pain currently, but has had intermittent pain overnight but much better with nitroglycerin infusion.  Notably improved from yesterday.  Inpatient Medications    Scheduled Meds: . clopidogrel  600 mg Oral Once  . metoprolol tartrate  100 mg Oral Daily   And  . metoprolol tartrate  50 mg Oral QHS  . pantoprazole  40 mg Oral BID AC  . regadenoson  0.4 mg Intravenous Once  . sodium chloride flush  3 mL Intravenous Q12H  . sodium chloride flush  3 mL Intravenous Q12H  . sodium chloride flush  3 mL Intravenous Q12H  . sucralfate  1 g Oral TID WC & HS   Continuous Infusions: . sodium chloride    . sodium chloride    . sodium chloride    . sodium chloride 1 mL/kg/hr (09/07/18 0557)  . heparin 1,000 Units/hr (09/07/18 0302)  . nitroGLYCERIN 10 mcg/min (09/06/18 1709)  . nitroGLYCERIN     PRN Meds: sodium chloride, sodium chloride, sodium chloride, acetaminophen, ALPRAZolam, alum & mag hydroxide-simeth, nitroGLYCERIN, ondansetron (ZOFRAN) IV, sodium chloride flush, sodium chloride flush, sodium chloride flush, zolpidem    Vital Signs    Vitals:   09/06/18 1700 09/06/18 1800 09/06/18 2042 09/07/18 0500  BP: 113/61 (!) 104/57 122/67   Pulse: 72 70 70   Resp: (!) 21 (!) 24 14   Temp:   97.9 F (36.6 C)   TempSrc:   Oral   SpO2: 98% 96% 97%   Weight:    94.5 kg  Height:        Intake/Output Summary (Last 24 hours) at 09/07/2018 0803 Last data filed at 09/06/2018 2157 Gross per 24 hour  Intake 870.25 ml  Output -  Net 870.25 ml   Filed Weights   09/06/18 0443 09/06/18 1040 09/07/18 0500  Weight: 93.3 kg 94.7 kg 94.5 kg    Telemetry    VT NS  - Personally Reviewed P-synchronous/ AV  pacing   ECG    No new EKG.  Physical Exam   Physical Exam  Constitutional: She is oriented to person, place, and time. She appears well-developed and well-nourished. No distress (But seemingly uncomfortable when noting chest discomfort).  Healthy-appearing.  Well-groomed  HENT:  Head: Normocephalic and atraumatic.  Neck: Normal range of motion. Neck supple. No hepatojugular reflux and no JVD present. Carotid bruit is not present.  Cardiovascular: Normal rate, regular rhythm, S1 normal, S2 normal and normal pulses. PMI is not displaced. Exam reveals distant heart sounds. Exam reveals no gallop.  No murmur heard. Pulmonary/Chest: Effort normal and breath sounds normal. No respiratory distress. She has no wheezes.  Abdominal: Soft. Bowel sounds are normal. She  exhibits no distension. There is no abdominal tenderness. There is no rebound.  Musculoskeletal: Normal range of motion.        General: No edema.  Neurological: She is alert and oriented to person, place, and time. No cranial nerve deficit.  Psychiatric: She has a normal mood and affect. Her behavior is normal. Judgment and thought content normal.  In good spirits.  Happy about decision.  Vitals reviewed.   Labs    Chemistry Recent Labs  Lab 09/05/18 0048 09/06/18 0456 09/07/18 0633  NA 140 139 139  K 3.8 3.5 3.8  CL 109 107 110  CO2  23 23 20*  GLUCOSE 108* 119* 117*  BUN 31* 26* 20  CREATININE 1.63* 1.62* 1.54*  CALCIUM 9.6 9.6 9.1  GFRNONAA 29* 29* 31*  GFRAA 34* 34* 36*  ANIONGAP 8 9 9      Hematology Recent Labs  Lab 09/05/18 0048 09/06/18 0240  WBC 7.1 6.5  RBC 3.40* 3.38*  HGB 11.3* 10.9*  HCT 33.3* 32.6*  MCV 97.9 96.4  MCH 33.2 32.2  MCHC 33.9 33.4  RDW 13.6 13.6  PLT 107* 114*    Cardiac Enzymes Recent Labs  Lab 09/04/18 1202 09/04/18 1855 09/05/18 0048  TROPONINI 0.71* 0.81* 0.70*   No results for input(s): TROPIPOC in the last 168 hours.   BNPNo results for input(s): BNP, PROBNP in the last 168 hours.   DDimer No results for input(s): DDIMER in the last 168 hours.   Radiology    No results found.  Cardiac Studies   TTE 09/04/2018: EF 55% with mild HK of apical anteroseptal apical myocardium.  GR 1 DD.  Relatively normal valves.  Cardiac Cath12/26/2019: Proximal-mid LAD 95%, ostial-proximal ramus 95% (both long lesions with borderline subtotal occlusion of LAD); ostial D1 100%, ostial D2 40%.  Mid RCA 70% and distal RCA 40%.  prox Cx-OM1 30% bifurcation lesion. -->  Placed on nitroglycerin drip and back on aspirin with plans for CT surgical consult.  Assessment & Plan     NSTEM / Coronary Artery Disease with Unstble Angina (Post-infarction Angina): Somewhat atypical sounding chest discomfort, symptoms are more consistent now with angina decubitus overnight.  Placed back on IV heparin (Xarelto on hold), and IV nitroglycerin with stabilization of chest pain.  Loaded with Plavix this morning, will be on triple therapy for perhaps 1 month and stop aspirin  Continue high-dose beta-blocker  Has been shown to be intolerant of statin in the past, will defer to primary cardiologist, may require PCSK9 inhibitor  Per patient request after CT surgical consult: Planned staged PCI of LAD and Ramus Intermedius today with Dr. Excell Seltzerooper   CHB/Pacemaker St [name]: Continues to have AV pacing.   Unable to see but underlying A. fib.   CRI grade 3 -renal function appears to be stable.  Hydrating precatheterization.  Part of the reason for planning staged PCI.  Follow closely.   Essential hypertension: Stable blood pressure on current dose dose of beta-blocker.  Will hold off on ACE inhibitor or ARB given her renal insufficiency and contrast load.  Hyperlipidemia may consider starting rosuvastatin, would prefer to do it as an outpatient with discussion with primary cardiologist.  Otherwise would consider novel agents such as bempedoic acid versus PCSK9.  History of A. Fib --multiple mode switches noted on pacer evaluation would suggest chronic persistent A. fib although not able to tell because of AV pacing on monitor.  Xarelto currently on hold.  Restart likely tonight  On high-dose beta-blocker  for rate control.  Backup pacemaking  Anemia new -relatively stable.  Follow post-cath    Signed, Bryan Lemmaavid Idella Lamontagne, MD  09/07/2018, 8:03 AM

## 2018-09-07 NOTE — Progress Notes (Addendum)
ANTICOAGULATION CONSULT NOTE  Pharmacy Consult for heparin Indication: chest pain/ACS and atrial fibrillation  Allergies  Allergen Reactions  . Atorvastatin Shortness Of Breath    MUSCLE PAIN  . Niacin     Unknown reaction   . Sulfa Antibiotics Nausea And Vomiting  . Penicillins Hives and Rash    Has patient had a PCN reaction causing immediate rash, facial/tongue/throat swelling, SOB or lightheadedness with hypotension: Yes Has patient had a PCN reaction causing severe rash involving mucus membranes or skin necrosis: Yes Has patient had a PCN reaction that required hospitalization: No Has patient had a PCN reaction occurring within the last 10 years: No If all of the above answers are "NO", then may proceed with Cephalosporin use.    Patient Measurements: Height: 5\' 6"  (167.6 cm) Weight: 208 lb 6.4 oz (94.5 kg) IBW/kg (Calculated) : 59.3 Heparin Dosing Weight: 81.9  Vital Signs:    Labs: Recent Labs    09/04/18 1202 09/04/18 1855  09/05/18 0048 09/06/18 0240 09/06/18 0456 09/06/18 1050 09/06/18 2251 09/07/18 0633 09/07/18 0729  HGB  --   --    < > 11.3* 10.9*  --   --   --   --  10.5*  HCT  --   --   --  33.3* 32.6*  --   --   --   --  31.1*  PLT  --   --   --  107* 114*  --   --   --   --  103*  APTT  --   --    < >  --  126*  --  >200* 64*  --  118*  HEPARINUNFRC  --   --    < >  --  1.70*  --  1.90* 0.84*  --  0.87*  CREATININE 1.80*  --   --  1.63*  --  1.62*  --   --  1.54*  --   TROPONINI 0.71* 0.81*  --  0.70*  --   --   --   --   --   --    < > = values in this interval not displayed.   Estimated Creatinine Clearance: 32.6 mL/min (A) (by C-G formula based on SCr of 1.54 mg/dL (H)).  Assessment: 84 yo Female transferred from [place] with chest pain/concern for NSTEMI. Patient has history of atrial fibrillation and on PTA Xarelto 20 mg daily, last dose 12/24 1900. Xarelto held since then, and pharmacy has been consulted for heparin dosing.   Patient s/p  cardiac cath 12/26 noted with 3V CAD and plans for staged PCI -heparin level= 0.87 and aPTT= 118  Goal of Therapy:  Heparin level 0.3-0.7 units/ml aPTT 66-102 seconds Monitor platelets by anticoagulation protocol: Yes   Plan:  -Decrease heparin to 900 units/hr -Heparin level and aPTT in 8 hours and daily wth CBC daily  Harland GermanAndrew Edda Orea, PharmD Clinical Pharmacist **Pharmacist phone directory can now be found on [email or web address] ([email or web address]).  Listed under [facility].

## 2018-09-07 NOTE — Progress Notes (Signed)
TR BAND REMOVAL  LOCATION:  right radial  DEFLATED PER PROTOCOL:  Yes.    TIME BAND OFF / DRESSING APPLIED:   1815   SITE UPON ARRIVAL:   Level 0  SITE AFTER BAND REMOVAL:  Level 0  CIRCULATION SENSATION AND MOVEMENT:  Within Normal Limits  Yes.    COMMENTS:    

## 2018-09-07 NOTE — Progress Notes (Signed)
 Progress Note  Patient Name: Judith Flores Date of Encounter: 09/07/2018  Primary Cardiologist: WSTilley Primary Electrophysiologist: SK   Patient Profile     84 y.o. female admitted in transfer with chest pain and found to have + troponin -atypical symptoms for ACS, but with elevated troponin, consider non-STEMI.  Initially plans for noninvasive evaluation with Myoview were canceled after the second troponin resulted as more significantly elevated.  Hx CHB s/p STJ PPM, Afib, GERD, anemia, LE edema.   Subjective   Had significant pain yesterday morning prior to cardiac catheterization and during the catheterization.  Was placed on nitroglycerin drip and back on IV heparin post catheterization. Cath showed severe LAD & ramus disease along with moderate RCA disease.  Per protocol CT surgery consult obtained.  Patient decided to go with PCI.  No further chest pain currently, but has had intermittent pain overnight but much better with nitroglycerin infusion.  Notably improved from yesterday.  Inpatient Medications    Scheduled Meds: . clopidogrel  600 mg Oral Once  . metoprolol tartrate  100 mg Oral Daily   And  . metoprolol tartrate  50 mg Oral QHS  . pantoprazole  40 mg Oral BID AC  . regadenoson  0.4 mg Intravenous Once  . sodium chloride flush  3 mL Intravenous Q12H  . sodium chloride flush  3 mL Intravenous Q12H  . sodium chloride flush  3 mL Intravenous Q12H  . sucralfate  1 g Oral TID WC & HS   Continuous Infusions: . sodium chloride    . sodium chloride    . sodium chloride    . sodium chloride 1 mL/kg/hr (09/07/18 0557)  . heparin 1,000 Units/hr (09/07/18 0302)  . nitroGLYCERIN 10 mcg/min (09/06/18 1709)  . nitroGLYCERIN     PRN Meds: sodium chloride, sodium chloride, sodium chloride, acetaminophen, ALPRAZolam, alum & mag hydroxide-simeth, nitroGLYCERIN, ondansetron (ZOFRAN) IV, sodium chloride flush, sodium chloride flush, sodium chloride flush, zolpidem    Vital Signs    Vitals:   09/06/18 1700 09/06/18 1800 09/06/18 2042 09/07/18 0500  BP: 113/61 (!) 104/57 122/67   Pulse: 72 70 70   Resp: (!) 21 (!) 24 14   Temp:   97.9 F (36.6 C)   TempSrc:   Oral   SpO2: 98% 96% 97%   Weight:    94.5 kg  Height:        Intake/Output Summary (Last 24 hours) at 09/07/2018 0803 Last data filed at 09/06/2018 2157 Gross per 24 hour  Intake 870.25 ml  Output -  Net 870.25 ml   Filed Weights   09/06/18 0443 09/06/18 1040 09/07/18 0500  Weight: 93.3 kg 94.7 kg 94.5 kg    Telemetry    VT NS  - Personally Reviewed P-synchronous/ AV  pacing   ECG    No new EKG.  Physical Exam   Physical Exam  Constitutional: She is oriented to person, place, and time. She appears well-developed and well-nourished. No distress (But seemingly uncomfortable when noting chest discomfort).  Healthy-appearing.  Well-groomed  HENT:  Head: Normocephalic and atraumatic.  Neck: Normal range of motion. Neck supple. No hepatojugular reflux and no JVD present. Carotid bruit is not present.  Cardiovascular: Normal rate, regular rhythm, S1 normal, S2 normal and normal pulses. PMI is not displaced. Exam reveals distant heart sounds. Exam reveals no gallop.  No murmur heard. Pulmonary/Chest: Effort normal and breath sounds normal. No respiratory distress. She has no wheezes.  Abdominal: Soft. Bowel sounds are normal. She   exhibits no distension. There is no abdominal tenderness. There is no rebound.  Musculoskeletal: Normal range of motion.        General: No edema.  Neurological: She is alert and oriented to person, place, and time. No cranial nerve deficit.  Psychiatric: She has a normal mood and affect. Her behavior is normal. Judgment and thought content normal.  In good spirits.  Happy about decision.  Vitals reviewed.   Labs    Chemistry Recent Labs  Lab 09/05/18 0048 09/06/18 0456 09/07/18 0633  NA 140 139 139  K 3.8 3.5 3.8  CL 109 107 110  CO2  23 23 20*  GLUCOSE 108* 119* 117*  BUN 31* 26* 20  CREATININE 1.63* 1.62* 1.54*  CALCIUM 9.6 9.6 9.1  GFRNONAA 29* 29* 31*  GFRAA 34* 34* 36*  ANIONGAP 8 9 9     Hematology Recent Labs  Lab 09/05/18 0048 09/06/18 0240  WBC 7.1 6.5  RBC 3.40* 3.38*  HGB 11.3* 10.9*  HCT 33.3* 32.6*  MCV 97.9 96.4  MCH 33.2 32.2  MCHC 33.9 33.4  RDW 13.6 13.6  PLT 107* 114*    Cardiac Enzymes Recent Labs  Lab 09/04/18 1202 09/04/18 1855 09/05/18 0048  TROPONINI 0.71* 0.81* 0.70*   No results for input(s): TROPIPOC in the last 168 hours.   BNPNo results for input(s): BNP, PROBNP in the last 168 hours.   DDimer No results for input(s): DDIMER in the last 168 hours.   Radiology    No results found.  Cardiac Studies   TTE 09/04/2018: EF 55% with mild HK of apical anteroseptal apical myocardium.  GR 1 DD.  Relatively normal valves.  Cardiac Cath12/26/2019: Proximal-mid LAD 95%, ostial-proximal ramus 95% (both long lesions with borderline subtotal occlusion of LAD); ostial D1 100%, ostial D2 40%.  Mid RCA 70% and distal RCA 40%.  prox Cx-OM1 30% bifurcation lesion. -->  Placed on nitroglycerin drip and back on aspirin with plans for CT surgical consult.  Assessment & Plan     NSTEM / Coronary Artery Disease with Unstble Angina (Post-infarction Angina): Somewhat atypical sounding chest discomfort, symptoms are more consistent now with angina decubitus overnight.  Placed back on IV heparin (Xarelto on hold), and IV nitroglycerin with stabilization of chest pain.  Loaded with Plavix this morning, will be on triple therapy for perhaps 1 month and stop aspirin  Continue high-dose beta-blocker  Has been shown to be intolerant of statin in the past, will defer to primary cardiologist, may require PCSK9 inhibitor  Per patient request after CT surgical consult: Planned staged PCI of LAD and Ramus Intermedius today with Dr. Cooper   CHB/Pacemaker St [name]: Continues to have AV pacing.   Unable to see but underlying A. fib.   CRI grade 3 -renal function appears to be stable.  Hydrating precatheterization.  Part of the reason for planning staged PCI.  Follow closely.   Essential hypertension: Stable blood pressure on current dose dose of beta-blocker.  Will hold off on ACE inhibitor or ARB given her renal insufficiency and contrast load.  Hyperlipidemia may consider starting rosuvastatin, would prefer to do it as an outpatient with discussion with primary cardiologist.  Otherwise would consider novel agents such as bempedoic acid versus PCSK9.  History of A. Fib --multiple mode switches noted on pacer evaluation would suggest chronic persistent A. fib although not able to tell because of AV pacing on monitor.  Xarelto currently on hold.  Restart likely tonight  On high-dose beta-blocker   for rate control.  Backup pacemaking  Anemia new -relatively stable.  Follow post-cath    Signed, David Harding, MD  09/07/2018, 8:03 AM    

## 2018-09-07 NOTE — Progress Notes (Signed)
ANTICOAGULATION CONSULT NOTE - Follow Up Consult  Pharmacy Consult for heparin Indication: Afib and severe CAD awaiting PCI  Labs: Recent Labs    09/04/18 1202 09/04/18 1855  09/05/18 0048 09/06/18 0240 09/06/18 0456 09/06/18 1050 09/06/18 2251  HGB  --   --   --  11.3* 10.9*  --   --   --   HCT  --   --   --  33.3* 32.6*  --   --   --   PLT  --   --   --  107* 114*  --   --   --   APTT  --   --    < >  --  126*  --  >200* 64*  HEPARINUNFRC  --   --    < >  --  1.70*  --  1.90* 0.84*  CREATININE 1.80*  --   --  1.63*  --  1.62*  --   --   TROPONINI 0.71* 0.81*  --  0.70*  --   --   --   --    < > = values in this interval not displayed.    Assessment: 84yo female slightly subtherapeutic on heparin after resuming post-cath; no gtt issues or signs of bleeding per RN.  Goal of Therapy:  Heparin level 0.3-0.7 units/ml aPTT 66-102 seconds   Plan:  Will increase heparin gtt slightly to 1000 units/hr and check PTT in 8 hours.    Vernard GamblesVeronda Gertrude Bucks, PharmD, BCPS  09/07/2018,12:16 AM

## 2018-09-07 NOTE — Interval H&P Note (Signed)
Cath Lab Visit (complete for each Cath Lab visit)  Clinical Evaluation Leading to the Procedure:   ACS: Yes.    Non-ACS:    Anginal Classification: CCS IV  Anti-ischemic medical therapy: Minimal Therapy (1 class of medications)  Non-Invasive Test Results: No non-invasive testing performed  Prior CABG: No previous CABG      History and Physical Interval Note:  09/07/2018 1:36 PM  Judith ShorterBarbara Flores  has presented today for surgery, with the diagnosis of CAD  The various methods of treatment have been discussed with the patient and family. After consideration of risks, benefits and other options for treatment, the patient has consented to  Procedure(s): CORONARY STENT INTERVENTION (N/A) as a surgical intervention .  The patient's history has been reviewed, patient examined, no change in status, stable for surgery.  I have reviewed the patient's chart and labs.  Questions were answered to the patient's satisfaction.     Tonny BollmanMichael Gavynn Duvall

## 2018-09-08 ENCOUNTER — Encounter (HOSPITAL_COMMUNITY): Payer: Self-pay | Admitting: Nurse Practitioner

## 2018-09-08 ENCOUNTER — Other Ambulatory Visit: Payer: Self-pay | Admitting: Nurse Practitioner

## 2018-09-08 DIAGNOSIS — I2510 Atherosclerotic heart disease of native coronary artery without angina pectoris: Secondary | ICD-10-CM

## 2018-09-08 DIAGNOSIS — N183 Chronic kidney disease, stage 3 unspecified: Secondary | ICD-10-CM

## 2018-09-08 DIAGNOSIS — I4819 Other persistent atrial fibrillation: Secondary | ICD-10-CM

## 2018-09-08 DIAGNOSIS — I2583 Coronary atherosclerosis due to lipid rich plaque: Secondary | ICD-10-CM

## 2018-09-08 LAB — CBC
HEMATOCRIT: 35.3 % — AB (ref 36.0–46.0)
Hemoglobin: 12.1 g/dL (ref 12.0–15.0)
MCH: 33.2 pg (ref 26.0–34.0)
MCHC: 34.3 g/dL (ref 30.0–36.0)
MCV: 97 fL (ref 80.0–100.0)
Platelets: 139 10*3/uL — ABNORMAL LOW (ref 150–400)
RBC: 3.64 MIL/uL — ABNORMAL LOW (ref 3.87–5.11)
RDW: 13.8 % (ref 11.5–15.5)
WBC: 10.9 10*3/uL — ABNORMAL HIGH (ref 4.0–10.5)
nRBC: 0 % (ref 0.0–0.2)

## 2018-09-08 LAB — BASIC METABOLIC PANEL
Anion gap: 12 (ref 5–15)
BUN: 17 mg/dL (ref 8–23)
CO2: 23 mmol/L (ref 22–32)
Calcium: 9.4 mg/dL (ref 8.9–10.3)
Chloride: 104 mmol/L (ref 98–111)
Creatinine, Ser: 1.67 mg/dL — ABNORMAL HIGH (ref 0.44–1.00)
GFR calc Af Amer: 33 mL/min — ABNORMAL LOW (ref >60)
GFR calc non Af Amer: 28 mL/min — ABNORMAL LOW (ref >60)
Glucose, Bld: 134 mg/dL — ABNORMAL HIGH (ref 70–99)
POTASSIUM: 3.9 mmol/L (ref 3.5–5.1)
Sodium: 139 mmol/L (ref 135–145)

## 2018-09-08 MED ORDER — METOPROLOL TARTRATE 50 MG PO TABS: 50.0000 mg | ORAL_TABLET | ORAL | Status: DC

## 2018-09-08 MED ORDER — ROSUVASTATIN CALCIUM 5 MG PO TABS: 5.0000 mg | ORAL_TABLET | Freq: Every day | ORAL | 6 refills | Status: DC

## 2018-09-08 MED ORDER — PANTOPRAZOLE SODIUM 40 MG PO TBEC: 40.0000 mg | DELAYED_RELEASE_TABLET | Freq: Two times a day (BID) | ORAL | 6 refills | Status: DC

## 2018-09-08 MED ORDER — RIVAROXABAN 15 MG PO TABS: 15.0000 mg | ORAL_TABLET | Freq: Every day | ORAL | 6 refills | Status: DC

## 2018-09-08 MED ORDER — CLOPIDOGREL BISULFATE 75 MG PO TABS: 75.0000 mg | ORAL_TABLET | Freq: Every day | ORAL | 6 refills | Status: DC

## 2018-09-08 MED ORDER — ASPIRIN 81 MG PO CHEW: 81.0000 mg | CHEWABLE_TABLET | Freq: Every day | ORAL | 6 refills | Status: DC

## 2018-09-08 MED ORDER — NITROGLYCERIN 0.4 MG SL SUBL: 0.4000 mg | SUBLINGUAL_TABLET | SUBLINGUAL | 3 refills | Status: DC | PRN

## 2018-09-08 NOTE — Progress Notes (Signed)
Progress Note  Patient Name: Judith Flores Date of Encounter: 09/08/2018  Primary Cardiologist: Georga HackingW Spencer Tilley, MD  Subjective   Slept well.  No chest pain or sob overnight.  R wrist feels well.  Hasn't ambulated yet.  Inpatient Medications    Scheduled Meds: . aspirin  81 mg Oral Daily  . clopidogrel  75 mg Oral Q breakfast  . metoprolol tartrate  100 mg Oral Daily   And  . metoprolol tartrate  50 mg Oral QHS  . pantoprazole  40 mg Oral BID AC  . rivaroxaban  15 mg Oral Daily  . sodium chloride flush  3 mL Intravenous Q12H  . sodium chloride flush  3 mL Intravenous Q12H  . sucralfate  1 g Oral TID WC & HS   Continuous Infusions: . sodium chloride    . sodium chloride     PRN Meds: sodium chloride, sodium chloride, acetaminophen, ALPRAZolam, alum & mag hydroxide-simeth, nitroGLYCERIN, ondansetron (ZOFRAN) IV, sodium chloride flush, sodium chloride flush, zolpidem   Vital Signs    Vitals:   09/08/18 0233 09/08/18 0500 09/08/18 0622 09/08/18 0722  BP:   111/65 (!) 108/54  Pulse:      Resp: 17  16 (!) 22  Temp:   98.7 F (37.1 C) 98.2 F (36.8 C)  TempSrc:   Oral Oral  SpO2: 100%  91% 91%  Weight:  92 kg    Height:        Intake/Output Summary (Last 24 hours) at 09/08/2018 [id number] Last data filed at 09/08/2018 0600 Gross per 24 hour  Intake 299.59 ml  Output 2850 ml  Net -2550.41 ml   Filed Weights   09/06/18 1040 09/07/18 0500 09/08/18 0500  Weight: 94.7 kg 94.5 kg 92 kg    Physical Exam   GEN: Well nourished, well developed, in no acute distress.  HEENT: Grossly normal.  Neck: Supple, no JVD, carotid bruits, or masses. Cardiac: RRR, no murmurs, rubs, or gallops. No clubbing, cyanosis, edema.  Diminished right radial pulse with normal ulnar.  Right wrist is mildly ecchymotic without bleeding, bruit, or hematoma.  Respiratory:  Respirations regular and unlabored, diminished breath sounds with faint bibasilar crackles that improved with deep  breathing. GI: Soft, nontender, nondistended, BS + x 4. MS: no deformity or atrophy. Skin: warm and dry, no rash. Neuro:  Strength and sensation are intact. Psych: AAOx3.  Normal affect.  Labs    Chemistry Recent Labs  Lab 09/06/18 0456 09/07/18 0633 09/08/18 0223  NA 139 139 139  K 3.5 3.8 3.9  CL 107 110 104  CO2 23 20* 23  GLUCOSE 119* 117* 134*  BUN 26* 20 17  CREATININE 1.62* 1.54* 1.67*  CALCIUM 9.6 9.1 9.4  GFRNONAA 29* 31* 28*  GFRAA 34* 36* 33*  ANIONGAP 9 9 12      Hematology Recent Labs  Lab 09/06/18 0240 09/07/18 0729 09/08/18 0223  WBC 6.5 6.9 10.9*  RBC 3.38* 3.23* 3.64*  HGB 10.9* 10.5* 12.1  HCT 32.6* 31.1* 35.3*  MCV 96.4 96.3 97.0  MCH 32.2 32.5 33.2  MCHC 33.4 33.8 34.3  RDW 13.6 13.6 13.8  PLT 114* 103* 139*    Cardiac Enzymes Recent Labs  Lab 09/04/18 1202 09/04/18 1855 09/05/18 0048  TROPONINI 0.71* 0.81* 0.70*      Radiology    No results found.  Telemetry    A sensed, V paced- Personally Reviewed  ECG    A sensed, V paced, underlying A. fib.- Personally Reviewed  Cardiac Studies   2D Echocardiogram 12.26.2019  Study Conclusions   - Left ventricle: The cavity size was normal. Systolic function was   normal. The estimated ejection fraction was in the range of 50%   to 55%. Mild hypokinesis of the apical anteroseptal and apical   myocardium. Doppler parameters are consistent with abnormal left   ventricular relaxation (grade 1 diastolic dysfunction). Doppler   parameters are consistent with indeterminate ventricular filling   pressure. - Aortic valve: Transvalvular velocity was within the normal range.   There was no stenosis. There was no regurgitation. Valve area   (VTI): 2.48 cm^2. Valve area (Vmean): 2.49 cm^2. - Mitral valve: Transvalvular velocity was within the normal range.   There was no evidence for stenosis. There was no regurgitation. - Right ventricle: The cavity size was normal. Wall thickness was    normal. Systolic function was normal. - Atrial septum: No defect or patent foramen ovale was identified   by color flow Doppler. - Tricuspid valve: There was trivial regurgitation. - Pulmonary arteries: Systolic pressure was within the normal   range. PA peak pressure: 13 mm Hg (S).   Patient Profile     84 y.o. female with a history of complete heart block status post pacemaker, paroxysmal atrial fibrillation, GERD, anemia, and stage III chronic kidney disease, who presented with non-STEMI and is now status post PCI and drug-eluting stent placement to the LAD and ramus intermedius.  Assessment & Plan    1.  Non-STEMI/CAD: Status post diagnostic catheterization on December 26 showing multivessel coronary artery disease.  She was evaluated by CT surgery and was felt to be a suitable candidate for bypass however, she preferred PCI.  Yesterday, she underwent successful PCI and drug-eluting stent placement to the LAD x2 and ramus intermedius x1.  No chest pain overnight.  Right wrist healing well though radial pulse is markedly diminished.  Normal ulnar.  Creatinine mildly elevated compared to yesterday but overall stable since admission.  Ambulate this morning.  Provided that she remains chest pain-free, will plan discharge later this morning.  As previously recommended by interventional cardiology, she will remain on aspirin 81 mg x 2 to 4 weeks as tolerated, Plavix and 5 mg times a minimum of 6 months, and Xarelto dose has been reduced to 15 mg so long as she is on Plavix.  Change medications discussed with patient.  Plan follow-up basic metabolic panel within the next few days in the outpatient setting as well as office follow-up within the next 7 to 14 days.  2.  Paroxysmal atrial fibrillation: Patient with underlying A. fib/flutter this morning and otherwise V pacing.  She is asymptomatic.  Previous pacer reports reviewed and she is known to have paroxysms of A. fib.  As above, Xarelto dose was  reduced to 15 mg in the setting of antiplatelet therapy.  She remains on beta-blocker.  3.  Essential hypertension: Stable on beta-blocker therapy.  4.  Stage III chronic kidney disease: Creatinine relatively stable.  Will need follow-up basic metabolic panel early next week and I will arrange.  5.  Hyperlipidemia: LDL 125.  Will add low-dose rosuvastatin given prior intolerance to atorvastatin.  Follow-up lipids and LFTs in 6 weeks and consider additional titration if tolerated.  May ultimately require PCSK9i.  6.  Anemia: Stable post PCI.  Likely secondary to blood draws.  Signed, Nicolasa Duckinghristopher Jamicia Haaland, NP  09/08/2018, 8:22 AM    For questions or updates, please contact   Please consult [email or web address] for  contact info under Cardiology/STEMI.

## 2018-09-08 NOTE — Discharge Instructions (Signed)
**PLEASE REMEMBER TO BRING ALL OF YOUR MEDICATIONS TO EACH OF YOUR FOLLOW-UP OFFICE VISITS.   NO HEAVY LIFTING X 2 WEEKS. NO SEXUAL ACTIVITY X 2 WEEKS. NO DRIVING X 1 WEEK. NO SOAKING BATHS, HOT TUBS, POOLS, ETC., X 7 DAYS.  Radial Site Care Refer to this sheet in the next few weeks. These instructions provide you with information on caring for yourself after your procedure. Your caregiver may also give you more specific instructions. Your treatment has been planned according to current medical practices, but problems sometimes occur. Call your caregiver if you have any problems or questions after your procedure. HOME CARE INSTRUCTIONS  You may shower the day after the procedure.Remove the bandage (dressing) and gently wash the site with plain soap and water.Gently pat the site dry.   Do not apply powder or lotion to the site.   Do not submerge the affected site in water for 3 to 5 days.   Inspect the site at least twice daily.   Do not flex or bend the affected arm for 24 hours.   No lifting over 5 pounds (2.3 kg) for 5 days after your procedure.   Do not drive home if you are discharged the same day of the procedure. Have someone else drive you.   What to expect:  Any bruising will usually fade within 1 to 2 weeks.   Blood that collects in the tissue (hematoma) may be painful to the touch. It should usually decrease in size and tenderness within 1 to 2 weeks.  SEEK IMMEDIATE MEDICAL CARE IF:  You have unusual pain at the radial site.   You have redness, warmth, swelling, or pain at the radial site.   You have drainage (other than a small amount of blood on the dressing).   You have chills.   You have a fever or persistent symptoms for more than 72 hours.   You have a fever and your symptoms suddenly get worse.   Your arm becomes pale, cool, tingly, or numb.   You have heavy bleeding from the site. Hold pressure on the site.   _____________  Information on my  medicine - XARELTO (Rivaroxaban)  This medication education was reviewed with me or my healthcare representative as part of my discharge preparation.    Why was Xarelto prescribed for you? Xarelto was prescribed for you to reduce the risk of a blood clot forming that can cause a stroke if you have a medical condition called atrial fibrillation (a type of irregular heartbeat).  What do you need to know about xarelto ? Take your Xarelto ONCE DAILY at the same time every day with your evening meal. If you have difficulty swallowing the tablet whole, you may crush it and mix in applesauce just prior to taking your dose.  Take Xarelto exactly as prescribed by your doctor and DO NOT stop taking Xarelto without talking to the doctor who prescribed the medication.  Stopping without other stroke prevention medication to take the place of Xarelto may increase your risk of developing a clot that causes a stroke.  Refill your prescription before you run out.  After discharge, you should have regular check-up appointments with your healthcare provider that is prescribing your Xarelto.  In the future your dose may need to be changed if your kidney function or weight changes by a significant amount.  What do you do if you miss a dose? If you are taking Xarelto ONCE DAILY and you miss a dose, take  it as soon as you remember on the same day then continue your regularly scheduled once daily regimen the next day. Do not take two doses of Xarelto at the same time or on the same day.   Important Safety Information A possible side effect of Xarelto is bleeding. You should call your healthcare provider right away if you experience any of the following: ? Bleeding from an injury or your nose that does not stop. ? Unusual colored urine (red or dark brown) or unusual colored stools (red or black). ? Unusual bruising for unknown reasons. ? A serious fall or if you hit your head (even if there is no  bleeding).  Some medicines may interact with Xarelto and might increase your risk of bleeding while on Xarelto. To help avoid this, consult your healthcare provider or pharmacist prior to using any new prescription or non-prescription medications, including herbals, vitamins, non-steroidal anti-inflammatory drugs (NSAIDs) and supplements.  This website has more information on Xarelto: [email or web address].

## 2018-09-08 NOTE — Progress Notes (Signed)
CARDIAC REHAB PHASE I   PRE:  Rate/Rhythm: 76 V.Paced   BP:  Supine:   Sitting: 112/59  Standing:    SaO2: 93% RA   MODE:  Ambulation: 350  Ft, rolling walker    POST:  Rate/Rhythem: 75 V. Paced  BP:  Supine:   Sitting: 104/85   Standing:    SaO2: 95% RA   [id number] Pt ambulated in hallway x1 assist using rolling walker, slow gait, unsteady at times. Pt had difficulty with standing from sitting position, pt coached in using rocking momentum to stand.  Pt c/o moderate dyspnea with ambulation. O2 sat-95%RA.  Pt coached in pursed lip breathing.  Pt returned to chair, call light in reach.   Education completed including risk factor modification, low fat-low cholesterol diet, exercise, and medication compliance.  Pt oriented to outpatient cardiac rehab.  [facility] pt request[facility]  Understanding verbalized.    CiscoJoann Hamilton Arneta Mahmood

## 2018-09-08 NOTE — Discharge Summary (Addendum)
Discharge Summary    Patient ID: Dewayne ShorterBarbara Gee MRN: [id number]; DOB: May 27, 1934  Admit date: 09/04/2018 Discharge date: 09/08/2018  Primary Care Provider: Carmin Richmondavis, James W, MD  Primary Cardiologist: Georga HackingW Spencer Tilley, MD (pt would like to f/u in our [place] office in the future). Primary Electrophysiologist:  Sherryl MangesSteven Klein, MD   Discharge Diagnoses    Principal Problem:   NSTEMI (non-ST elevated myocardial infarction) [facility](HCC)  **s/p PCI and drug-eluting stent placement to the LAD and ramus intermedius this admission. Active Problems:   CAD (coronary artery disease)   Paroxysmal atrial fibrillation (HCC)   Hypertension   CKD (chronic kidney disease), stage III (HCC)   Atrioventricular block, complete (HCC)   Pacemaker-[employer] Jud[employer]   Epigastric pain   Allergies Allergies  Allergen Reactions  . Atorvastatin Shortness Of Breath    MUSCLE PAIN  . Niacin     Unknown reaction   . Sulfa Antibiotics Nausea And Vomiting  . Penicillins Hives and Rash    Has patient had a PCN reaction causing immediate rash, facial/tongue/throat swelling, SOB or lightheadedness with hypotension: Yes Has patient had a PCN reaction causing severe rash involving mucus membranes or skin necrosis: Yes Has patient had a PCN reaction that required hospitalization: No Has patient had a PCN reaction occurring within the last 10 years: No If all of the above answers are "NO", then may proceed with Cephalosporin use.     Diagnostic Studies/Procedures    2D Echocardiogram 12.24.2019  Study Conclusions   - Left ventricle: The cavity size was normal. Systolic function was   normal. The estimated ejection fraction was in the range of 50%   to 55%. Mild hypokinesis of the apical anteroseptal and apical   myocardium. Doppler parameters are consistent with abnormal left   ventricular relaxation (grade 1 diastolic dysfunction). Doppler   parameters are consistent with indeterminate ventricular filling    pressure. - Aortic valve: Transvalvular velocity was within the normal range.   There was no stenosis. There was no regurgitation. Valve area   (VTI): 2.48 cm^2. Valve area (Vmean): 2.49 cm^2. - Mitral valve: Transvalvular velocity was within the normal range.   There was no evidence for stenosis. There was no regurgitation. - Right ventricle: The cavity size was normal. Wall thickness was   normal. Systolic function was normal. - Atrial septum: No defect or patent foramen ovale was identified   by color flow Doppler. - Tricuspid valve: There was trivial regurgitation. - Pulmonary arteries: Systolic pressure was within the normal   range. PA peak pressure: 13 mm Hg (S). _____________  Cardiac Catheterization 12.26.2019  Diagnostic  Dominance: Right  Left Anterior Descending  Prox LAD to Mid LAD lesion 95% stenosed  Prox LAD to Mid LAD lesion is 95% stenosed. The lesion is eccentric and thrombotic. There is a long complex area of stenosis throughout the entire proximal LAD. The mid vessel at the origin of the second diagonal branch is subtotally occluded with thrombus present. There is TIMI II flow beyond the lesion.  First Diagonal Branch  Ost 1st Diag lesion 100% stenosed  Ost 1st Diag lesion is 100% stenosed. Small vessel  Second Diagonal Branch  Ost 2nd Diag to 2nd Diag lesion 40% stenosed  Ost 2nd Diag to 2nd Diag lesion is 40% stenosed.  Ramus Intermedius  Vessel is large.  Ost Ramus to Ramus lesion 95% stenosed  Ost Ramus to Ramus lesion is 95% stenosed. Severe diffuse disease of a large intermediate branch  Left Circumflex  Prox Cx  lesion 30% stenosed with side branch in Ost 1st Mrg 30% stenosed  Prox Cx lesion is 30% stenosed with 30% stenosed side branch in Ost 1st Mrg.  Right Coronary Artery  Mid RCA lesion 70% stenosed  Mid RCA lesion is 70% stenosed. The lesion is calcified.  Dist RCA lesion 40% stenosed  Dist RCA lesion is 40% stenosed.  _____________   Cardiac  Catheterization and Percutaneous Coronary Intervention 12.27.2019  Left Anterior Descending  Prox LAD to Mid LAD lesion 95% stenosed  Prox LAD to Mid LAD lesion is 95% stenosed. The lesion is eccentric and thrombotic. There is a long complex area of stenosis throughout the entire proximal LAD. The mid vessel at the origin of the second diagonal branch is subtotally occluded with thrombus present. There is TIMI II flow beyond the lesion.      **The proximal LAD was successfully stented with a 3.5 x 12 mm resolute Onyx drug-eluting stent.  The mid LAD was successfully stented using a 2.5 x 38 mm resolute onyx drug-eluting stent.  First Diagonal Branch  Ost 1st Diag lesion 100% stenosed  Ost 1st Diag lesion is 100% stenosed. Small vessel  Second Diagonal Branch  Ost 2nd Diag to 2nd Diag lesion 40% stenosed  Ost 2nd Diag to 2nd Diag lesion is 40% stenosed.  Ramus Intermedius  Vessel is large.  Ost Ramus to Ramus lesion 95% stenosed  Ost Ramus to Ramus lesion is 95% stenosed. Severe diffuse disease of a large intermediate branch      **The Ramus intermedius was successfully stented using a 2.25 x 38 mm resolute Onyx drug-eluting stent.  Left Circumflex  Prox Cx lesion 30% stenosed with side branch in Ost 1st Mrg 30% stenosed  Prox Cx lesion is 30% stenosed with 30% stenosed side branch in Ost 1st Mrg.  Right Coronary Artery  Mid RCA lesion 70% stenosed  Mid RCA lesion is 70% stenosed. The lesion is calcified.  Dist RCA lesion 40% stenosed  Dist RCA lesion is 40% stenosed.  _____________    History of Present Illness     84 year old female with a prior history of paroxysmal atrial fibrillation on Xarelto, complete heart block status post [facility] [name] permanent pacemaker, GERD, and anemia who presented to [facility] on December 24 with 8 out of 10 chest pain.  She was found to have troponin elevation and was transferred to [facility] for further evaluation.  Hospital Course       Consultants: CT surgery  Troponin eventually peaked at 0.81 on December 24.  Echocardiogram was undertaken and showed normal LV function.  She was initially set up for stress testing but this was canceled secondary to troponin elevation.  As result, she underwent diagnostic cardiac catheterization on December 26 showing multivessel coronary artery disease involving the LAD, ramus, and mid RCA.  She was evaluated by CT surgery and was felt to be a suitable candidate for bypass however, patient preferred to avoid surgery and thus PCI was pursued.  On December 27, she was taken back to the cardiac catheterization laboratory where she underwent successful PCI drug-eluting stent placement to the LAD x2 in the ramus intermedius x1.  She tolerated the procedure well and post procedure, her Xarelto dose was decreased to 15 mg daily in the setting of aspirin and Plavix.    She was found to be in asymptomatic, rate controlled afib this AM.  Prior pacer interrogation reports show that she has a h/o paroxysms.  She remains on  blocker therapy.  She has been ambulating without difficulty or recurrent symptoms this morning will be discharged home today in good condition.  Of note, creatinine has remained relatively stable throughout her hospitalization and is 1.67 at discharge.  We have arranged for follow-up basic metabolic panel on Tuesday, December 31.  She will be discharged home on aspirin 81 mg for 2 to 4 weeks, Plavix 75 mg times a minimum of 6 months, and Xarelto 15 mg daily.  Though she has prior intolerance to atorvastatin (muscle aches and sob), we are adding low-dose rosuvastatin and will plan to follow-up lipids and LFTs in 6 to 8 weeks. _____________  Discharge Vitals Blood pressure (!) 108/54, pulse 75, temperature 98.2 F (36.8 C), temperature source Oral, resp. rate (!) 22, height 5\' 6"  (1.676 m), weight 92 kg, SpO2 91 %.  Filed Weights   09/06/18 1040 09/07/18 0500 09/08/18 0500  Weight: 94.7 kg  94.5 kg 92 kg    Labs & Radiologic Studies    CBC Recent Labs    09/07/18 0729 09/08/18 0223  WBC 6.9 10.9*  HGB 10.5* 12.1  HCT 31.1* 35.3*  MCV 96.3 97.0  PLT 103* 139*   Basic Metabolic Panel Recent Labs    11/91/4712/27/19 0633 09/08/18 0223  NA 139 139  K 3.8 3.9  CL 110 104  CO2 20* 23  GLUCOSE 117* 134*  BUN 20 17  CREATININE 1.54* 1.67*  CALCIUM 9.1 9.4    Cardiac Enzymes Lab Results  Component Value Date   TROPONINI 0.70 (HH) 09/05/2018    Hemoglobin A1C Lab Results  Component Value Date   HGBA1C 5.7 (H) 09/04/2018    Fasting Lipid Panel Lab Results  Component Value Date   CHOL 193 09/05/2018   HDL 31 (L) 09/05/2018   LDLCALC 125 (H) 09/05/2018   TRIG 187 (H) 09/05/2018   CHOLHDL 6.2 09/05/2018    _____________  Nm Myocar Single W/spect W/wall Motion And Ef  Result Date: 09/05/2018  Defect 1: There is a small defect of moderate severity present in the apex location.  This can be seen with apical thinning or prior infarct.  Rest only images obtained.    Disposition   Pt is being discharged home today in good condition.  Follow-up Plans & Appointments    Follow-up Information    Duke SalviaKlein, Steven C, MD Follow up.   Specialty:  Cardiology Why:  We will arrange for f/u with one of Dr. Odessa FlemingKlein's NPs/PAs and contact you. We would like you to come to our office on 12/31 for a blood test (chemistry) so that we may re-evaluate your kidney function following catheterization. Contact information: [address] [place] [address] [phone]            Discharge Medications   Allergies as of 09/08/2018      Reactions   Atorvastatin Shortness Of Breath   MUSCLE PAIN   Niacin    Unknown reaction    Sulfa Antibiotics Nausea And Vomiting   Penicillins Hives, Rash   Has patient had a PCN reaction causing immediate rash, facial/tongue/throat swelling, SOB or lightheadedness with hypotension: Yes Has patient had a PCN reaction  causing severe rash involving mucus membranes or skin necrosis: Yes Has patient had a PCN reaction that required hospitalization: No Has patient had a PCN reaction occurring within the last 10 years: No If all of the above answers are "NO", then may proceed with Cephalosporin use.      Medication  List    STOP taking these medications   eucerin cream   hydrochlorothiazide 25 MG tablet Commonly known as:  HYDRODIURIL   losartan 100 MG tablet Commonly known as:  COZAAR   omeprazole 20 MG capsule Commonly known as:  PRILOSEC   potassium chloride SA 20 MEQ tablet Commonly known as:  K-DUR,KLOR-CON     TAKE these medications   aspirin 81 MG chewable tablet Chew 1 tablet (81 mg total) by mouth daily.   clopidogrel 75 MG tablet Commonly known as:  PLAVIX Take 1 tablet (75 mg total) by mouth daily with breakfast. Start taking on:  September 09, 2018   ferrous sulfate 325 (65 FE) MG EC tablet Take 1 tablet (325 mg total) by mouth 3 (three) times daily with meals. What changed:  when to take this   furosemide 20 MG tablet Commonly known as:  LASIX Take 20 mg by mouth as needed for edema.   metoprolol tartrate 50 MG tablet Commonly known as:  LOPRESSOR Take 1-2 tablets (50-100 mg total) by mouth See admin instructions. 100mg  (2 pills) in AM 50mg  (1 pill) in PM   nitroGLYCERIN 0.4 MG SL tablet Commonly known as:  NITROSTAT Place 1 tablet (0.4 mg total) under the tongue every 5 (five) minutes x 3 doses as needed for chest pain.   pantoprazole 40 MG tablet Commonly known as:  PROTONIX Take 1 tablet (40 mg total) by mouth 2 (two) times daily before a meal.   Rivaroxaban 15 MG Tabs tablet Commonly known as:  XARELTO Take 1 tablet (15 mg total) by mouth daily. What changed:    medication strength  how much to take   rosuvastatin 5 MG tablet Commonly known as:  CRESTOR Take 1 tablet (5 mg total) by mouth at bedtime.   VITAMIN B-12 IJ Inject 1,000 mg as directed every  30 (thirty) days.        Acute coronary syndrome (MI, NSTEMI, STEMI, etc) this admission?: Yes.     AHA/ACC Clinical Performance & Quality Measures: 1. Aspirin prescribed? - Yes 2. ADP Receptor Inhibitor (Plavix/Clopidogrel, Brilinta/Ticagrelor or Effient/Prasugrel) prescribed (includes medically managed patients)? - Yes 3. Beta Blocker prescribed? - Yes 4. High Intensity Statin (Lipitor 40-80mg  or Crestor 20-40mg ) prescribed? - No - prior intolerance - low dose rosuvastatin started 5. EF assessed during THIS hospitalization? - Yes 6. For EF <40%, was ACEI/ARB prescribed? - Not Applicable (EF >/= 40%) 7. For EF <40%, Aldosterone Antagonist (Spironolactone or Eplerenone) prescribed? - Not Applicable (EF >/= 40%) 8. Cardiac Rehab Phase II ordered (Included Medically managed Patients)? - Yes     Outstanding Labs/Studies   F/u BMET on 12/31. F/u lipids/lft's in 6-8 wks.  Duration of Discharge Encounter   Greater than 30 minutes including physician time.  Signed, Nicolasa Ducking, NP 09/08/2018, 8:54 AM

## 2018-09-10 ENCOUNTER — Encounter (HOSPITAL_COMMUNITY): Payer: Self-pay | Admitting: Cardiovascular Disease

## 2018-09-10 ENCOUNTER — Telehealth: Payer: Self-pay | Admitting: Emergency Medicine

## 2018-09-10 ENCOUNTER — Telehealth: Payer: Self-pay | Admitting: Internal Medicine

## 2018-09-10 MED FILL — Heparin Sod (Porcine)-NaCl IV Soln 1000 Unit/500ML-0.9%: INTRAVENOUS | Qty: 1000 | Status: AC

## 2018-09-10 MED FILL — Ondansetron HCl Inj 4 MG/2ML (2 MG/ML): INTRAMUSCULAR | Qty: 2 | Status: AC

## 2018-09-10 NOTE — Telephone Encounter (Signed)
[facility].  Patient understands to follow up with provider Dr. Bing MatterKrasowski on 09/17/2018 at 10:40 at [facility].  Patient understands discharge instructions? Yes   Patient understands medications and regiment? Yes   Patient understands to bring all medications to this visit? Yes   Patient asking about her dressing on her wrist. She reports it is clean and is having no issues with it. Patient advised to keep a clean dressing and advise us with any other concerns.

## 2018-09-10 NOTE — Telephone Encounter (Signed)
New message   Patient states that she forgot the card that you carry in your wallet that states that she had a cath done. Please advise.

## 2018-09-10 NOTE — Telephone Encounter (Signed)
Spoke to the patient who said that she lost her stent card from her recent hospital visit.  I informed her to call the hospital and request another card.  She verbalized understanding.

## 2018-09-11 ENCOUNTER — Other Ambulatory Visit: Payer: Medicare Other | Admitting: *Deleted

## 2018-09-11 DIAGNOSIS — N183 Chronic kidney disease, stage 3 unspecified: Secondary | ICD-10-CM

## 2018-09-11 LAB — BASIC METABOLIC PANEL
BUN/Creatinine Ratio: 15 (ref 12–28)
BUN: 30 mg/dL — ABNORMAL HIGH (ref 8–27)
CO2: 20 mmol/L (ref 20–29)
Calcium: 9.6 mg/dL (ref 8.7–10.3)
Chloride: 99 mmol/L (ref 96–106)
Creatinine, Ser: 1.98 mg/dL — ABNORMAL HIGH (ref 0.57–1.00)
GFR calc Af Amer: 27 mL/min/{1.73_m2} — ABNORMAL LOW (ref >59)
GFR calc non Af Amer: 23 mL/min/{1.73_m2} — ABNORMAL LOW (ref >59)
Glucose: 106 mg/dL — ABNORMAL HIGH (ref 65–99)
POTASSIUM: 3.6 mmol/L (ref 3.5–5.2)
Sodium: 138 mmol/L (ref 134–144)

## 2018-09-14 ENCOUNTER — Encounter: Payer: Self-pay | Admitting: Nurse Practitioner

## 2018-09-14 ENCOUNTER — Ambulatory Visit: Payer: Medicare Other | Admitting: Nurse Practitioner

## 2018-09-14 VITALS — BP 126/80 | HR 73 | Ht 66.0 in | Wt 209.6 lb

## 2018-09-14 DIAGNOSIS — I2510 Atherosclerotic heart disease of native coronary artery without angina pectoris: Secondary | ICD-10-CM | POA: Diagnosis not present

## 2018-09-14 DIAGNOSIS — I480 Paroxysmal atrial fibrillation: Secondary | ICD-10-CM

## 2018-09-14 DIAGNOSIS — N183 Chronic kidney disease, stage 3 unspecified: Secondary | ICD-10-CM

## 2018-09-14 DIAGNOSIS — I442 Atrioventricular block, complete: Secondary | ICD-10-CM | POA: Diagnosis not present

## 2018-09-14 LAB — CUP PACEART INCLINIC DEVICE CHECK
Date Time Interrogation Session: 20200103083337
Implantable Lead Implant Date: 20130321
Implantable Lead Implant Date: 20130321
Implantable Lead Location: 753859
Implantable Lead Location: 753860
Implantable Pulse Generator Implant Date: 20190503
Pulse Gen Model: 2272
Pulse Gen Serial Number: 9019281

## 2018-09-14 LAB — BASIC METABOLIC PANEL
BUN/Creatinine Ratio: 11 — ABNORMAL LOW (ref 12–28)
BUN: 17 mg/dL (ref 8–27)
CHLORIDE: 104 mmol/L (ref 96–106)
CO2: 20 mmol/L (ref 20–29)
Calcium: 9.6 mg/dL (ref 8.7–10.3)
Creatinine, Ser: 1.6 mg/dL — ABNORMAL HIGH (ref 0.57–1.00)
GFR calc Af Amer: 34 mL/min/{1.73_m2} — ABNORMAL LOW (ref >59)
GFR calc non Af Amer: 30 mL/min/{1.73_m2} — ABNORMAL LOW (ref >59)
Glucose: 88 mg/dL (ref 65–99)
POTASSIUM: 3.8 mmol/L (ref 3.5–5.2)
Sodium: 141 mmol/L (ref 134–144)

## 2018-09-14 NOTE — Progress Notes (Signed)
Electrophysiology Office Note Date: 09/14/2018  ID:  Judith Flores, DOB 08-Oct-1933, MRN [id number]  PCP: Carmin Richmondavis, James W, MD Primary Cardiologist: Donnie Ahoilley -> [place] Electrophysiologist: Graciela HusbandsKlein  CC: Pacemaker/NSTEMI follow-up  Judith ShorterBarbara Flores is a 85 y.o. female seen today for Dr Graciela HusbandsKlein.  She was admitted 12/24-12/28/19 with NSTEMI and is s/p PCI/DES to LAD and ramus.  With CAD, she was referred to CT surgery for consideration of CABG which was recommended, however, the patient preferred to avoid surgery and elected for PCI.  Post procedure, her Xarelto dose was decreased to 15mg  per day in the setting of DAPT.   Since discharge, the patient reports doing very well.  She denies chest pain, palpitations, dyspnea, PND, orthopnea, nausea, vomiting, dizziness, syncope, edema, weight gain, or early satiety.  Device History: STJ dual chamber PPM implanted 1999 for complete heart block; gen change 2009; gen change 2013 with new RA and RV leads; gen change 2019   Past Medical History:  Diagnosis Date  . Anemia   . CAD (coronary artery disease)    a. 08/2018 NSTEMI/PCI: LM nl, LAD 95p/m (3.5 x 12 mm resolute Onyx DES and 2.5 x 38 mm resolute Onyx DES successfully placed), D1 100, D2 40, RI 95 (2.25 x 38 mm resolute Onyx DES), LCx 30p, RCA 8158m 40d.  . CKD (chronic kidney disease), stage III (HCC) 09/04/2018  . Complete heart block (HCC)    intermittent  . Epigastric pain 09/04/2018  . GERD (gastroesophageal reflux disease)   . HOH (hard of hearing)   . HTN (hypertension)   . Ischemic cardiomyopathy    a. 08/2018 Echo: Ef 50-55%, mild apical anteroseptal and apical HK. Gr1 DD. Triv TR. PaSP 13mmHg.  [name]. Pacemaker   . PAF (paroxysmal atrial fibrillation) (HCC)   . Peripheral edema   . Recurrent upper respiratory infection (URI)    HX OF CHRONIC BRONCHITIS  . Syncope    Past Surgical History:  Procedure Laterality Date  . breast tumor    . CORONARY STENT INTERVENTION N/A 09/07/2018   Procedure: CORONARY STENT INTERVENTION;  Surgeon: Tonny Bollmanooper, Michael, MD;  [facility];  Service: Cardiovascular;  Laterality: N/A;  . INSERT / REPLACE / REMOVE PACEMAKER     [employer] [name]  . LEFT HEART CATH AND CORONARY ANGIOGRAPHY N/A 09/06/2018   Procedure: LEFT HEART CATH AND CORONARY ANGIOGRAPHY;  Surgeon: Tonny Bollmanooper, Michael, MD;  [facility];  Service: Cardiovascular;  Laterality: N/A;  . PACEMAKER REVISION N/A 12/01/2011   Procedure: PACEMAKER REVISION;  Surgeon: Duke SalviaSteven C Klein, MD;  [facility];  Service: Cardiovascular;  Laterality: N/A;  . PPM GENERATOR CHANGEOUT N/A 01/12/2018   Procedure: PPM GENERATOR CHANGEOUT;  Surgeon: Duke SalviaKlein, Steven C, MD;  [facility];  Service: Cardiovascular;  Laterality: N/A;    Current Outpatient Medications  Medication Sig Dispense Refill  . aspirin 81 MG chewable tablet Chew 1 tablet (81 mg total) by mouth daily. 30 tablet 6  . clopidogrel (PLAVIX) 75 MG tablet Take 1 tablet (75 mg total) by mouth daily with breakfast. 30 tablet 6  . Cyanocobalamin (VITAMIN B-12 IJ) Inject 1,000 mg as directed every 30 (thirty) days.     . ferrous sulfate 325 (65 FE) MG EC tablet Take 1 tablet (325 mg total) by mouth 3 (three) times daily with meals. 30 tablet 11  . furosemide (LASIX) 20 MG tablet Take 20 mg by mouth as needed for edema.    . metoprolol tartrate (  LOPRESSOR) 50 MG tablet Take 1-2 tablets (50-100 mg total) by mouth See admin instructions. 100mg  (2 pills) in AM 50mg  (1 pill) in PM    . nitroGLYCERIN (NITROSTAT) 0.4 MG SL tablet Place 1 tablet (0.4 mg total) under the tongue every 5 (five) minutes x 3 doses as needed for chest pain. 25 tablet 3  . pantoprazole (PROTONIX) 40 MG tablet Take 1 tablet (40 mg total) by mouth 2 (two) times daily before a meal. 60 tablet 6  . Rivaroxaban (XARELTO) 15 MG TABS tablet Take 1 tablet (15 mg total) by mouth daily. 30 tablet 6  . rosuvastatin (CRESTOR) 5 MG tablet Take 1  tablet (5 mg total) by mouth at bedtime. 30 tablet 6   No current facility-administered medications for this visit.     Allergies:   Atorvastatin; Niacin; Sulfa antibiotics; and Penicillins   Social History: Social History   Socioeconomic History  . Marital status: Widowed    Spouse name: Not on file  . Number of children: Not on file  . Years of education: Not on file  . Highest education level: Not on file  Occupational History  . Not on file  Social Needs  . Financial resource strain: Not on file  . Food insecurity:    Worry: Patient refused    Inability: Patient refused  . Transportation needs:    Medical: Patient refused    Non-medical: Patient refused  Tobacco Use  . Smoking status: Never Smoker  . Smokeless tobacco: Never Used  Substance and Sexual Activity  . Alcohol use: No  . Drug use: No  . Sexual activity: Never    Birth control/protection: Post-menopausal  Lifestyle  . Physical activity:    Days per week: Patient refused    Minutes per session: Patient refused  . Stress: Not on file  Relationships  . Social connections:    Talks on phone: Patient refused    Gets together: Patient refused    Attends religious service: Patient refused    Active member of club or organization: Patient refused    Attends meetings of clubs or organizations: Patient refused    Relationship status: Patient refused  . Intimate partner violence:    Fear of current or ex partner: Patient refused    Emotionally abused: Patient refused    Physically abused: Patient refused    Forced sexual activity: Patient refused  Other Topics Concern  . Not on file  Social History Narrative  . Not on file    Family History: Family History  Problem Relation Age of Onset  . Heart disease Mother   . Heart disease Father      Review of Systems: All other systems reviewed and are otherwise negative except as noted above.   Physical Exam: VS:  BP 126/80   Pulse 73   Ht 5\' 6"   (1.676 m)   Wt 209 lb 9.6 oz (95.1 kg)   SpO2 98%   BMI 33.83 kg/m  , BMI Body mass index is 33.83 kg/m.  GEN- The patient is elderly appearing, alert and oriented x 3 today.   HEENT: normocephalic, atraumatic; sclera clear, conjunctiva pink; hearing intact; oropharynx clear; neck supple  Lungs- Clear to ausculation bilaterally, normal work of breathing.  No wheezes, rales, rhonchi Heart- Regular rate and rhythm  GI- soft, non-tender, non-distended, bowel sounds present  Extremities- no clubbing, cyanosis, or edema; left wrist ecchymosis, +radial pulse MS- no significant deformity or atrophy Skin- warm and dry, no rash or  lesion; PPM pocket well healed Psych- euthymic mood, full affect Neuro- strength and sensation are intact  PPM Interrogation- reviewed in detail today,  See PACEART report  EKG:  EKG is not ordered today.  Recent Labs: 09/08/2018: Hemoglobin 12.1; Platelets 139 09/11/2018: BUN 30; Creatinine, Ser 1.98; Potassium 3.6; Sodium 138   Wt Readings from Last 3 Encounters:  09/14/18 209 lb 9.6 oz (95.1 kg)  09/08/18 202 lb 14.4 oz (92 kg)  06/19/18 207 lb 3.2 oz (94 kg)     Other studies Reviewed: Additional studies/ records that were reviewed today include: hospital records   Review of the above records today demonstrates: as above  Assessment and Plan:  1.  Complete heart block Normal PPM function See Pace Art report No changes today  2.  NSTEMI/CAD S/p DES to LAD and ramus Continue ASA 81mg  daily for 3 more weeks then stop Continue Plavix x 6 months Will continue Xarelto 15mg  daily while on DAPT  3.  AKI Repeat BMET today Hold Lasix Encouraged po intake  4.  Paroxysmal atrial fibrillation Burden by device interrogation 7.2% - AP/VP today Continue Xarelto as above Will need to revisit increasing dose back to 20mg  daily when off DAPT   Current medicines are reviewed at length with the patient today.   The patient does not have concerns regarding  her medicines.  The following changes were made today:  none  Labs/ tests ordered today include: BMET No orders of the defined types were placed in this encounter.    Disposition:   Follow up with Arsenio Katz, Dr Graciela Husbands 1 year, Dr Bing Matter as scheduled.    Signed, Gypsy Balsam, NP 09/14/2018 8:25 AM  [facility] [address] [place] [address] [phone] (office) [phone] (fax)

## 2018-09-14 NOTE — Patient Instructions (Signed)
Medication Instructions:  NONE If you need a refill on your cardiac medications before your next appointment, please call your pharmacy.   Lab work:TODAY BMET If you have labs (blood work) drawn today and your tests are completely normal, you will receive your results only by: [name] MyChart Message (if you have MyChart) OR . A paper copy in the mail If you have any lab test that is abnormal or we need to change your treatment, we will call you to review the results.  Testing/Procedures: NONE  Follow-Up: At [facility], you and your health needs are our priority.  As part of our continuing mission to provide you with exceptional heart care, we have created designated Provider Care Teams.  These Care Teams include your primary Cardiologist (physician) and Advanced Practice Providers (APPs -  Physician Assistants and Nurse Practitioners) who all work together to provide you with the care you need, when you need it. You will need a follow up appointment in 1 years.  Please call our office 2 months in advance to schedule this appointment.  You may see Sherryl Manges, MD or one of the following Advanced Practice Providers on your designated Care Team:   Gypsy Balsam, NP . Francis Dowse, PA-C  Any Other Special Instructions Will Be Listed Below (If Applicable). Remote monitoring is used to monitor your Pacemaker from home. This monitoring reduces the number of office visits required to check your device to one time per year. It allows [place] to keep an eye on the functioning of your device to ensure it is working properly. You are scheduled for a device check from home on 09/18/18. You may send your transmission at any time that day. If you have a wireless device, the transmission will be sent automatically. After your physician reviews your transmission, you will receive a postcard with your next transmission date.

## 2018-09-17 ENCOUNTER — Encounter: Payer: Self-pay | Admitting: Cardiology

## 2018-09-17 ENCOUNTER — Ambulatory Visit: Payer: Medicare Other | Admitting: Cardiology

## 2018-09-17 VITALS — BP 118/62 | HR 72 | Ht 66.0 in | Wt 210.0 lb

## 2018-09-17 DIAGNOSIS — I480 Paroxysmal atrial fibrillation: Secondary | ICD-10-CM | POA: Diagnosis not present

## 2018-09-17 DIAGNOSIS — I10 Essential (primary) hypertension: Secondary | ICD-10-CM | POA: Diagnosis not present

## 2018-09-17 DIAGNOSIS — I2510 Atherosclerotic heart disease of native coronary artery without angina pectoris: Secondary | ICD-10-CM | POA: Diagnosis not present

## 2018-09-17 DIAGNOSIS — Z950 Presence of cardiac pacemaker: Secondary | ICD-10-CM

## 2018-09-17 DIAGNOSIS — I2583 Coronary atherosclerosis due to lipid rich plaque: Secondary | ICD-10-CM

## 2018-09-17 DIAGNOSIS — I442 Atrioventricular block, complete: Secondary | ICD-10-CM

## 2018-09-17 NOTE — Progress Notes (Signed)
Cardiology Office Note:    Date:  09/17/2018   ID:  Judith Flores, DOB [birth year], MRN [id number]  PCP:  [name], MD  Cardiologist:  Gypsy Balsam, MD    Referring MD: [name], MD   No chief complaint on file. Doing well  History of Present Illness:    Judith Flores is a 85 y.o. female who recently ended up coming to the hospital because of chest pain she was find to have non-STEMI with the highest troponin less than 1 echocardiogram ejection fraction 50 to 55%, cardiac catheterization was done she was find to have completely occluded small diagonal branch 95% proximal LAD as well as 90% of ramus intermedius.  Residual 70% RCA.  She was offered bypass surgery however declined and multi stent PTCA was performed that involve stents to proximal LAD as well as ramus intermedius.  Since that time she is doing well complain of being weak tired exhausted but no more chest pain she is in triple therapy with Xarelto and appropriate dose of 50 mg daily aspirin as well as Plavix she understand that 3 weeks from now she need to stop aspirin.  Previous she had difficulty tolerating statin but now she is on 5 mg Crestor seems to be doing well.  We talked about healthy lifestyle exercises on a regular basis and good diet.  See him back in my office in 1 month at that time most likely will check a fasting lipid profile.  She also has kidney dysfunction with creatinine 2 weeks ago of 1.98 and latest creatinine checked just a week ago showed creatinine of 1.6.  Past Medical History:  Diagnosis Date  . Anemia   . CAD (coronary artery disease)    a. 08/2018 NSTEMI/PCI: LM nl, LAD 95p/m (3.5 x 12 mm resolute Onyx DES and 2.5 x 38 mm resolute Onyx DES successfully placed), D1 100, D2 40, RI 95 (2.25 x 38 mm resolute Onyx DES), LCx 30p, RCA 24m 40d.  . CKD (chronic kidney disease), stage III (HCC) 09/04/2018  . Complete heart block (HCC)    intermittent  . Epigastric pain 09/04/2018  . GERD  (gastroesophageal reflux disease)   . HOH (hard of hearing)   . HTN (hypertension)   . Ischemic cardiomyopathy    a. 08/2018 Echo: Ef 50-55%, mild apical anteroseptal and apical HK. Gr1 DD. Triv TR. PaSP .  [name] Pacemaker   . PAF (paroxysmal atrial fibrillation) (HCC)   . Peripheral edema   . Recurrent upper respiratory infection (URI)    HX OF CHRONIC BRONCHITIS  . Syncope     Past Surgical History:  Procedure Laterality Date  . breast tumor    . CORONARY STENT INTERVENTION N/A 09/07/2018   Procedure: CORONARY STENT INTERVENTION;  Surgeon: Tonny Bollman, MD;  Location: [facility] INVASIVE CV LAB;  Service: Cardiovascular;  Laterality: N/A;  . INSERT / REPLACE / REMOVE PACEMAKER     St [name]  . LEFT HEART CATH AND CORONARY ANGIOGRAPHY N/A 09/06/2018   Procedure: LEFT HEART CATH AND CORONARY ANGIOGRAPHY;  Surgeon: Tonny Bollman, MD;  Location: [facility] INVASIVE CV LAB;  Service: Cardiovascular;  Laterality: N/A;  . PACEMAKER REVISION N/A 12/01/2011   Procedure: PACEMAKER REVISION;  Surgeon: [name], MD;  Location: [facility] CATH LAB;  Service: Cardiovascular;  Laterality: N/A;  . PPM GENERATOR CHANGEOUT N/A 01/12/2018   Procedure: PPM GENERATOR CHANGEOUT;  Surgeon: [name], MD;  Location: [facility] INVASIVE CV LAB;  Service: Cardiovascular;  Laterality: N/A;  Current Medications: Current Meds  Medication Sig  . aspirin 81 MG chewable tablet Chew 1 tablet (81 mg total) by mouth daily.  . clopidogrel (PLAVIX) 75 MG tablet Take 1 tablet (75 mg total) by mouth daily with breakfast.  . Cyanocobalamin (VITAMIN B-12 IJ) Inject 1,000 mg as directed every 30 (thirty) days.   . ferrous sulfate 325 (65 FE) MG EC tablet Take 1 tablet (325 mg total) by mouth 3 (three) times daily with meals. (Patient taking differently: Take 325 mg by mouth daily with breakfast. )  . furosemide (LASIX) 20 MG tablet Take 20 mg by mouth as needed for edema.  . metoprolol tartrate (LOPRESSOR) 50 MG tablet Take 1-2 tablets  (50-100 mg total) by mouth See admin instructions. 100mg  (2 pills) in AM 50mg  (1 pill) in PM  . nitroGLYCERIN (NITROSTAT) 0.4 MG SL tablet Place 1 tablet (0.4 mg total) under the tongue every 5 (five) minutes x 3 doses as needed for chest pain.  . pantoprazole (PROTONIX) 40 MG tablet Take 1 tablet (40 mg total) by mouth 2 (two) times daily before a meal.  . Rivaroxaban (XARELTO) 15 MG TABS tablet Take 1 tablet (15 mg total) by mouth daily.  . rosuvastatin (CRESTOR) 5 MG tablet Take 1 tablet (5 mg total) by mouth at bedtime.     Allergies:   Atorvastatin; Niacin; Sulfa antibiotics; and Penicillins   Social History   Socioeconomic History  . Marital status: Widowed    Spouse name: Not on file  . Number of children: Not on file  . Years of education: Not on file  . Highest education level: Not on file  Occupational History  . Not on file  Social Needs  . Financial resource strain: Not on file  . Food insecurity:    Worry: Patient refused    Inability: Patient refused  . Transportation needs:    Medical: Patient refused    Non-medical: Patient refused  Tobacco Use  . Smoking status: Never Smoker  . Smokeless tobacco: Never Used  Substance and Sexual Activity  . Alcohol use: No  . Drug use: No  . Sexual activity: Never    Birth control/protection: Post-menopausal  Lifestyle  . Physical activity:    Days per week: Patient refused    Minutes per session: Patient refused  . Stress: Not on file  Relationships  . Social connections:    Talks on phone: Patient refused    Gets together: Patient refused    Attends religious service: Patient refused    Active member of club or organization: Patient refused    Attends meetings of clubs or organizations: Patient refused    Relationship status: Patient refused  Other Topics Concern  . Not on file  Social History Narrative  . Not on file     Family History: The patient's family history includes Heart disease in her father and  mother. ROS:   Please see the history of present illness.    All 14 point review of systems negative except as described per history of present illness  EKGs/Labs/Other Studies Reviewed:      Recent Labs: 09/08/2018: Hemoglobin 12.1; Platelets 139 09/14/2018: BUN 17; Creatinine, Ser 1.60; Potassium 3.8; Sodium 141  Recent Lipid Panel    Component Value Date/Time   CHOL 193 09/05/2018 0048   TRIG 187 (H) 09/05/2018 0048   HDL 31 (L) 09/05/2018 0048   CHOLHDL 6.2 09/05/2018 0048   VLDL 37 09/05/2018 0048   LDLCALC 125 (H) 09/05/2018 4982  Physical Exam:    VS:  BP 118/62 (BP Location: Right Arm, Patient Position: Sitting, Cuff Size: Normal)   Pulse 72   Ht 5\' 6"  (1.676 m)   Wt 210 lb (95.3 kg)   SpO2 98%   BMI 33.89 kg/m     Wt Readings from Last 3 Encounters:  09/17/18 210 lb (95.3 kg)  09/14/18 209 lb 9.6 oz (95.1 kg)  09/08/18 202 lb 14.4 oz (92 kg)     GEN:  Well nourished, well developed in no acute distress HEENT: Normal NECK: No JVD; No carotid bruits LYMPHATICS: No lymphadenopathy CARDIAC: RRR, no murmurs, no rubs, no gallops RESPIRATORY:  Clear to auscultation without rales, wheezing or rhonchi  ABDOMEN: Soft, non-tender, non-distended MUSCULOSKELETAL:  No edema; No deformity  SKIN: Warm and dry LOWER EXTREMITIES: no swelling NEUROLOGIC:  Alert and oriented x 3 PSYCHIATRIC:  Normal affect  Right wrist in the area of access to her radial artery healed well however I do not feel positive better.  She is completely asymptomatic denies have any pain in her hand good capillary refill.  ASSESSMENT:    1. Coronary artery disease due to lipid rich plaque   2. Essential hypertension   3. Paroxysmal atrial fibrillation (HCC)   4. Atrioventricular block, complete (HCC)   5. Pacemaker-St Judes    PLAN:    In order of problems listed above:  1. Coronary artery disease status post PTCA and drug-eluting stent to proximal LAD as well as ramus intermedius.   Doing well explained to her procedure what was done and why 2. Essential hypertension blood pressure well controlled continue present management 3. Paroxysmal atrial fibrillation she is on Xarelto which I will continue. 4. Complete heart block which is addressed with pacemaker that being followed by our pacemaker clinic.  Overall she is recovering nicely and I see her back in 1 month sooner she has a problem   Medication Adjustments/Labs and Tests Ordered: Current medicines are reviewed at length with the patient today.  Concerns regarding medicines are outlined above.  No orders of the defined types were placed in this encounter.  Medication changes: No orders of the defined types were placed in this encounter.   Signed, Georgeanna Leaobert J. , MD, [facility] 09/17/2018 11:21 AM    [facility]

## 2018-09-17 NOTE — Patient Instructions (Signed)
Medication Instructions:  Your physician recommends that you continue on your current medications as directed. Please refer to the Current Medication list given to you today.  If you need a refill on your cardiac medications before your next appointment, please call your pharmacy.   Lab work: None.  If you have labs (blood work) drawn today and your tests are completely normal, you will receive your results only by: . MyChart Message (if you have MyChart) OR . A paper copy in the mail If you have any lab test that is abnormal or we need to change your treatment, we will call you to review the results.  Testing/Procedures: None.   Follow-Up: At [facility], you and your health needs are our priority.  As part of our continuing mission to provide you with exceptional heart care, we have created designated Provider Care Teams.  These Care Teams include your primary Cardiologist (physician) and Advanced Practice Providers (APPs -  Physician Assistants and Nurse Practitioners) who all work together to provide you with the care you need, when you need it. You will need a follow up appointment in 1 months.  Please call our office 2 months in advance to schedule this appointment.  You may see Robert Krasowski, MD or another member of our [facility] Provider Team in [place]: Brian Munley, MD . Rajan Revankar, MD  Any Other Special Instructions Will Be Listed Below (If Applicable).     

## 2018-09-18 ENCOUNTER — Ambulatory Visit (INDEPENDENT_AMBULATORY_CARE_PROVIDER_SITE_OTHER): Payer: Medicare Other

## 2018-09-18 DIAGNOSIS — I442 Atrioventricular block, complete: Secondary | ICD-10-CM

## 2018-09-18 DIAGNOSIS — R55 Syncope and collapse: Secondary | ICD-10-CM

## 2018-09-19 ENCOUNTER — Telehealth: Payer: Self-pay

## 2018-09-19 LAB — CUP PACEART REMOTE DEVICE CHECK
Battery Remaining Longevity: 107 mo
Battery Remaining Percentage: 95.5 %
Battery Voltage: 3.01 V
Brady Statistic AP VP Percent: 75 %
Brady Statistic AP VS Percent: 1 %
Brady Statistic AS VS Percent: 1 %
Brady Statistic RA Percent Paced: 75 %
Date Time Interrogation Session: 20200107082417
Implantable Lead Implant Date: 20130321
Implantable Lead Implant Date: 20130321
Implantable Lead Location: 753859
Implantable Lead Location: 753860
Implantable Pulse Generator Implant Date: 20190503
Lead Channel Impedance Value: 350 Ohm
Lead Channel Impedance Value: 530 Ohm
Lead Channel Pacing Threshold Amplitude: 0.75 V
Lead Channel Pacing Threshold Amplitude: 1 V
Lead Channel Pacing Threshold Pulse Width: 0.4 ms
Lead Channel Pacing Threshold Pulse Width: 0.4 ms
Lead Channel Sensing Intrinsic Amplitude: 1.3 mV
Lead Channel Sensing Intrinsic Amplitude: 8.5 mV
Lead Channel Setting Pacing Amplitude: 1 V
Lead Channel Setting Pacing Pulse Width: 0.4 ms
Lead Channel Setting Sensing Sensitivity: 6 mV
MDC IDC SET LEADCHNL RA PACING AMPLITUDE: 2 V
MDC IDC STAT BRADY AS VP PERCENT: 25 %
MDC IDC STAT BRADY RV PERCENT PACED: 99 %
Pulse Gen Model: 2272
Pulse Gen Serial Number: 9019281

## 2018-09-19 NOTE — Telephone Encounter (Signed)
Notes recorded by Sigurd Sos, RN on 09/19/2018 at 8:43 AM EST The patient has been notified of the result and verbalized understanding. All questions (if any) were answered. Sigurd Sos, RN 09/19/2018 8:43 AM

## 2018-09-19 NOTE — Telephone Encounter (Signed)
-----   Message from Marily Lente, NP sent at 09/14/2018  8:09 PM EST ----- Please notify patient of lab results. Renal function improving.  Follow up with Dr Bing Matter next week.  He will decide when need to repeat labs. Thanks!

## 2018-09-19 NOTE — Progress Notes (Signed)
Remote pacemaker transmission.   

## 2018-10-03 ENCOUNTER — Telehealth: Payer: Self-pay | Admitting: Cardiology

## 2018-10-03 NOTE — Telephone Encounter (Signed)
Has questions about her xarelto dosage

## 2018-10-03 NOTE — Telephone Encounter (Signed)
Attempted to contact patient back, no answer at this time. No voicemail will continue efforts

## 2018-10-04 NOTE — Telephone Encounter (Signed)
Patient returned your call, please call back. °

## 2018-10-04 NOTE — Telephone Encounter (Signed)
Still uncusseful in reaching patient. I have left message with son per dpr for him to have her call us back

## 2018-10-05 NOTE — Telephone Encounter (Signed)
Pt called to say she wasn't sure what dose of Xarelto she should be on. Looked in cht at last visit and saw she is on Xarelto 15 mg. Pt states she will continue on this dose and taking her aspirin until she see's Dr. Kirtland BouchardK in Feb. 2020

## 2018-10-05 NOTE — Telephone Encounter (Signed)
Phone line is busy will continue efforts.  

## 2018-10-19 ENCOUNTER — Ambulatory Visit: Payer: Medicare Other | Admitting: Cardiology

## 2018-10-19 ENCOUNTER — Encounter: Payer: Self-pay | Admitting: Cardiology

## 2018-10-19 VITALS — BP 116/76 | HR 68 | Ht 66.0 in | Wt 202.8 lb

## 2018-10-19 DIAGNOSIS — Z950 Presence of cardiac pacemaker: Secondary | ICD-10-CM

## 2018-10-19 DIAGNOSIS — I480 Paroxysmal atrial fibrillation: Secondary | ICD-10-CM

## 2018-10-19 DIAGNOSIS — I2510 Atherosclerotic heart disease of native coronary artery without angina pectoris: Secondary | ICD-10-CM | POA: Diagnosis not present

## 2018-10-19 DIAGNOSIS — I442 Atrioventricular block, complete: Secondary | ICD-10-CM | POA: Diagnosis not present

## 2018-10-19 DIAGNOSIS — I2583 Coronary atherosclerosis due to lipid rich plaque: Secondary | ICD-10-CM

## 2018-10-19 DIAGNOSIS — Z789 Other specified health status: Secondary | ICD-10-CM

## 2018-10-19 HISTORY — DX: Other specified health status: Z78.9

## 2018-10-19 LAB — BASIC METABOLIC PANEL
BUN/Creatinine Ratio: 15 (ref 12–28)
BUN: 21 mg/dL (ref 8–27)
CALCIUM: 10.5 mg/dL — AB (ref 8.7–10.3)
CO2: 21 mmol/L (ref 20–29)
Chloride: 102 mmol/L (ref 96–106)
Creatinine, Ser: 1.37 mg/dL — ABNORMAL HIGH (ref 0.57–1.00)
GFR calc Af Amer: 41 mL/min/{1.73_m2} — ABNORMAL LOW (ref >59)
GFR calc non Af Amer: 36 mL/min/{1.73_m2} — ABNORMAL LOW (ref >59)
GLUCOSE: 122 mg/dL — AB (ref 65–99)
Potassium: 3.6 mmol/L (ref 3.5–5.2)
Sodium: 141 mmol/L (ref 134–144)

## 2018-10-19 MED ORDER — EZETIMIBE 10 MG PO TABS: 10.0000 mg | ORAL_TABLET | Freq: Every day | ORAL | 1 refills | Status: DC

## 2018-10-19 NOTE — Patient Instructions (Addendum)
Medication Instructions:  Your physician has recommended you make the following change in your medication:   Start: Zetia 10 mg daily  Stop: aspirin  If you need a refill on your cardiac medications before your next appointment, please call your pharmacy.   Lab work: Your physician recommends that you return for lab work today: bmp   If you have labs (blood work) drawn today and your tests are completely normal, you will receive your results only by: [name] MyChart Message (if you have MyChart) OR . A paper copy in the mail If you have any lab test that is abnormal or we need to change your treatment, we will call you to review the results.  Testing/Procedures: None.   Follow-Up: At [facility], you and your health needs are our priority.  As part of our continuing mission to provide you with exceptional heart care, we have created designated Provider Care Teams.  These Care Teams include your primary Cardiologist (physician) and Advanced Practice Providers (APPs -  Physician Assistants and Nurse Practitioners) who all work together to provide you with the care you need, when you need it. You will need a follow up appointment in 2 months.  Please call our office 2 months in advance to schedule this appointment.  You may see Gypsy Balsam, MD or another member of our [facility] Provider Team in [place]: Norman Herrlich, MD . Belva Crome, MD  Any Other Special Instructions Will Be Listed Below (If Applicable).  Ezetimibe Tablets What is this medicine? EZETIMIBE (ez ET i mibe) blocks the absorption of cholesterol from the stomach. It can help lower blood cholesterol for patients who are at risk of getting heart disease or a stroke. It is only for patients whose cholesterol level is not controlled by diet. This medicine may be used for other purposes; ask your health care provider or pharmacist if you have questions. COMMON BRAND NAME(S): Zetia What should I tell my health care  provider before I take this medicine? They need to know if you have any of these conditions: -liver disease -an unusual or allergic reaction to ezetimibe, medicines, foods, dyes, or preservatives -pregnant or trying to get pregnant -breast-feeding How should I use this medicine? Take this medicine by mouth with a glass of water. Follow the directions on the prescription label. This medicine can be taken with or without food. Take your doses at regular intervals. Do not take your medicine more often than directed. Talk to your pediatrician regarding the use of this medicine in children. Special care may be needed. Overdosage: If you think you have taken too much of this medicine contact a poison control center or emergency room at once. NOTE: This medicine is only for you. Do not share this medicine with others. What if I miss a dose? If you miss a dose, take it as soon as you can. If it is almost time for your next dose, take only that dose. Do not take double or extra doses. What may interact with this medicine? Do not take this medicine with any of the following medications: -fenofibrate -gemfibrozil This medicine may also interact with the following medications: -antacids -cyclosporine -herbal medicines like red yeast rice -other medicines to lower cholesterol or triglycerides This list may not describe all possible interactions. Give your health care provider a list of all the medicines, herbs, non-prescription drugs, or dietary supplements you use. Also tell them if you smoke, drink alcohol, or use illegal drugs. Some items may interact with  your medicine. What should I watch for while using this medicine? Visit your doctor or health care professional for regular checks on your progress. You will need to have your cholesterol levels checked. If you are also taking some other cholesterol medicines, you will also need to have tests to make sure your liver is working properly. Tell your  doctor or health care professional if you get any unexplained muscle pain, tenderness, or weakness, especially if you also have a fever and tiredness. You need to follow a low-cholesterol, low-fat diet while you are taking this medicine. This will decrease your risk of getting heart and blood vessel disease. Exercising and avoiding alcohol and smoking can also help. Ask your doctor or dietician for advice. What side effects may I notice from receiving this medicine? Side effects that you should report to your doctor or health care professional as soon as possible: -allergic reactions like skin rash, itching or hives, swelling of the face, lips, or tongue -dark yellow or brown urine -unusually weak or tired -yellowing of the skin or eyes Side effects that usually do not require medical attention (report to your doctor or health care professional if they continue or are bothersome): -diarrhea -dizziness -headache -stomach upset or pain This list may not describe all possible side effects. Call your doctor for medical advice about side effects. You may report side effects to FDA at 1-800-FDA-1088. Where should I keep my medicine? Keep out of the reach of children. Store at room temperature between 15 and 30 degrees C (59 and 86 degrees F). Protect from moisture. Keep container tightly closed. Throw away any unused medicine after the expiration date. NOTE: This sheet is a summary. It may not cover all possible information. If you have questions about this medicine, talk to your doctor, pharmacist, or health care provider.  2019 Elsevier/Gold Standard (2012-03-05 15:39:09)

## 2018-10-19 NOTE — Progress Notes (Signed)
Cardiology Office Note:    Date:  10/19/2018   ID:  Judith Flores, DOB 1934/05/23, MRN [id number]  PCP:  [name], MD  Cardiologist:  Gypsy Balsam, MD    Referring MD: [name], MD   Chief Complaint  Patient presents with  . 1 month follow up  Doing well but I could not tolerate Crestor  History of Present Illness:    Judith Flores is a 85 y.o. female with coronary artery disease status post PTCA and stenting done in December 2019.  On triple therapy.  Comes today to my office for follow-up complaining of having pain in the left leg previously when she was taking Lipitor she also got that pain Lipitor has been discontinued now she stopped her Crestor leg pain is much better it is somewhat puzzling to me why the only symptoms that she got like coming from the left leg but she is convinced that this is related to statin and she does not want to take any more statin she said that she could not live like that with the pain.  Otherwise doing well.  Cannot afford rehab.  The only thing she does is walking a lot in the house or when the weather is good outside  Past Medical History:  Diagnosis Date  . Anemia   . CAD (coronary artery disease)    a. 08/2018 NSTEMI/PCI: LM nl, LAD 95p/m (3.5 x 12 mm resolute Onyx DES and 2.5 x 38 mm resolute Onyx DES successfully placed), D1 100, D2 40, RI 95 (2.25 x 38 mm resolute Onyx DES), LCx 30p, RCA 76m 40d.  . CKD (chronic kidney disease), stage III (HCC) 09/04/2018  . Complete heart block (HCC)    intermittent  . Epigastric pain 09/04/2018  . GERD (gastroesophageal reflux disease)   . HOH (hard of hearing)   . HTN (hypertension)   . Ischemic cardiomyopathy    a. 08/2018 Echo: Ef 50-55%, mild apical anteroseptal and apical HK. Gr1 DD. Triv TR. PaSP .  [name] Pacemaker   . PAF (paroxysmal atrial fibrillation) (HCC)   . Peripheral edema   . Recurrent upper respiratory infection (URI)    HX OF CHRONIC BRONCHITIS  . Syncope      Past Surgical History:  Procedure Laterality Date  . breast tumor    . CORONARY STENT INTERVENTION N/A 09/07/2018   Procedure: CORONARY STENT INTERVENTION;  Surgeon: Tonny Bollman, MD;  Location: [facility] INVASIVE CV LAB;  Service: Cardiovascular;  Laterality: N/A;  . INSERT / REPLACE / REMOVE PACEMAKER     St [name]  . LEFT HEART CATH AND CORONARY ANGIOGRAPHY N/A 09/06/2018   Procedure: LEFT HEART CATH AND CORONARY ANGIOGRAPHY;  Surgeon: Tonny Bollman, MD;  Location: [facility] INVASIVE CV LAB;  Service: Cardiovascular;  Laterality: N/A;  . PACEMAKER REVISION N/A 12/01/2011   Procedure: PACEMAKER REVISION;  Surgeon: [name], MD;  Location: [facility] CATH LAB;  Service: Cardiovascular;  Laterality: N/A;  . PPM GENERATOR CHANGEOUT N/A 01/12/2018   Procedure: PPM GENERATOR CHANGEOUT;  Surgeon: [name], MD;  Location: [facility] INVASIVE CV LAB;  Service: Cardiovascular;  Laterality: N/A;    Current Medications: Current Meds  Medication Sig  . aspirin 81 MG chewable tablet Chew 1 tablet (81 mg total) by mouth daily.  . clopidogrel (PLAVIX) 75 MG tablet Take 1 tablet (75 mg total) by mouth daily with breakfast.  . Cyanocobalamin (VITAMIN B-12 IJ) Inject 1,000 mg as directed every 30 (thirty) days.   . ferrous  sulfate 325 (65 FE) MG EC tablet Take 1 tablet (325 mg total) by mouth 3 (three) times daily with meals. (Patient taking differently: Take 325 mg by mouth daily with breakfast. )  . furosemide (LASIX) 20 MG tablet Take 20 mg by mouth as needed for edema.  . metoprolol tartrate (LOPRESSOR) 50 MG tablet Take 1-2 tablets (50-100 mg total) by mouth See admin instructions. 100mg  (2 pills) in AM 50mg  (1 pill) in PM  . nitroGLYCERIN (NITROSTAT) 0.4 MG SL tablet Place 1 tablet (0.4 mg total) under the tongue every 5 (five) minutes x 3 doses as needed for chest pain.  [name] omeprazole (PRILOSEC) 40 MG capsule Take 40 mg by mouth daily.  . Rivaroxaban (XARELTO) 15 MG TABS tablet Take 1 tablet (15 mg total) by  mouth daily.  . Vitamin D, Ergocalciferol, (DRISDOL) 1.25 MG (50000 UT) CAPS capsule Take 50,000 Units by mouth every 7 (seven) days.     Allergies:   Atorvastatin; Crestor [rosuvastatin calcium]; Niacin; Sulfa antibiotics; and Penicillins   Social History   Socioeconomic History  . Marital status: Widowed    Spouse name: Not on file  . Number of children: Not on file  . Years of education: Not on file  . Highest education level: Not on file  Occupational History  . Not on file  Social Needs  . Financial resource strain: Not on file  . Food insecurity:    Worry: Patient refused    Inability: Patient refused  . Transportation needs:    Medical: Patient refused    Non-medical: Patient refused  Tobacco Use  . Smoking status: Never Smoker  . Smokeless tobacco: Never Used  Substance and Sexual Activity  . Alcohol use: No  . Drug use: No  . Sexual activity: Never    Birth control/protection: Post-menopausal  Lifestyle  . Physical activity:    Days per week: Patient refused    Minutes per session: Patient refused  . Stress: Not on file  Relationships  . Social connections:    Talks on phone: Patient refused    Gets together: Patient refused    Attends religious service: Patient refused    Active member of club or organization: Patient refused    Attends meetings of clubs or organizations: Patient refused    Relationship status: Patient refused  Other Topics Concern  . Not on file  Social History Narrative  . Not on file     Family History: The patient's family history includes Heart disease in her father and mother. ROS:   Please see the history of present illness.    All 14 point review of systems negative except as described per history of present illness  EKGs/Labs/Other Studies Reviewed:      Recent Labs: 09/08/2018: Hemoglobin 12.1; Platelets 139 09/14/2018: BUN 17; Creatinine, Ser 1.60; Potassium 3.8; Sodium 141  Recent Lipid Panel    Component Value  Date/Time   CHOL 193 09/05/2018 0048   TRIG 187 (H) 09/05/2018 0048   HDL 31 (L) 09/05/2018 0048   CHOLHDL 6.2 09/05/2018 0048   VLDL 37 09/05/2018 0048   LDLCALC 125 (H) 09/05/2018 0048    Physical Exam:    VS:  BP 116/76   Pulse 68   Ht 5\' 6"  (1.676 m)   Wt 202 lb 12.8 oz (92 kg)   SpO2 98%   BMI 32.73 kg/m     Wt Readings from Last 3 Encounters:  10/19/18 202 lb 12.8 oz (92 kg)  09/17/18 210  lb (95.3 kg)  09/14/18 209 lb 9.6 oz (95.1 kg)     GEN:  Well nourished, well developed in no acute distress HEENT: Normal NECK: No JVD; No carotid bruits LYMPHATICS: No lymphadenopathy CARDIAC: RRR, no murmurs, no rubs, no gallops RESPIRATORY:  Clear to auscultation without rales, wheezing or rhonchi  ABDOMEN: Soft, non-tender, non-distended MUSCULOSKELETAL:  No edema; No deformity  SKIN: Warm and dry LOWER EXTREMITIES: no swelling NEUROLOGIC:  Alert and oriented x 3 PSYCHIATRIC:  Normal affect   ASSESSMENT:    1. Coronary artery disease due to lipid rich plaque   2. Atrioventricular block, complete (HCC)   3. Paroxysmal atrial fibrillation (HCC)   4. Pacemaker-St Judes   5. Statin intolerance    PLAN:    In order of problems listed above:  1. Coronary disease stable on appropriate medication which I will continue.  This is more than a month after stent implantation she is on triple therapy I will discontinue her aspirin.  Today we will check a Chem-7 to make sure kidney function is known so we can determine what would be the dose of Xarelto.  Now she is on 15 since her last GFR was less than 50.  She is not symptomatic and on appropriate medications otherwise. 2. AV block this being addressed with the pacemaker that was recently interrogated normal function 3. Statin intolerance I had a long discussion about that with her she told me she cannot live with the pain that she was getting when she was taking statin.  We started talking about injectable medication she is  reluctant to initiate that she is looking for another alternative I told her about Zetia she is willing to try.  We will try 10 mg every day however I told her I doubt very much that we will be able to accomplish reasonable control of her cholesterol just with Zetia alone.  In the future we will continue conversation about PCSK9 agent. 4. Paroxysmal atrial fibrillation denies having any episodes.  Anticoagulated which I will continue Chem-7 will be checked to determine the dose of Xarelto   Medication Adjustments/Labs and Tests Ordered: Current medicines are reviewed at length with the patient today.  Concerns regarding medicines are outlined above.  No orders of the defined types were placed in this encounter.  Medication changes: No orders of the defined types were placed in this encounter.   Signed, Georgeanna Leaobert J. Sanna Porcaro, MD, [facility] 10/19/2018 10:57 AM    [facility]

## 2018-11-01 ENCOUNTER — Telehealth: Payer: Self-pay | Admitting: Internal Medicine

## 2018-11-01 NOTE — Telephone Encounter (Signed)
Tried to contact patient, phone rang with no answer or answering machine to leave message

## 2018-11-01 NOTE — Telephone Encounter (Signed)
New Message    Patient states she would like to talk to Casimiro Needle about the heart attack she had on Christmas and she has some questions about it.

## 2018-11-28 ENCOUNTER — Ambulatory Visit (INDEPENDENT_AMBULATORY_CARE_PROVIDER_SITE_OTHER): Payer: Medicare Other

## 2018-11-28 ENCOUNTER — Other Ambulatory Visit: Payer: Self-pay

## 2018-11-28 ENCOUNTER — Encounter: Payer: Self-pay | Admitting: Cardiology

## 2018-11-28 ENCOUNTER — Ambulatory Visit (INDEPENDENT_AMBULATORY_CARE_PROVIDER_SITE_OTHER): Payer: Medicare Other | Admitting: Cardiology

## 2018-11-28 VITALS — BP 112/62 | HR 74 | Ht 66.0 in | Wt 195.0 lb

## 2018-11-28 DIAGNOSIS — M7989 Other specified soft tissue disorders: Secondary | ICD-10-CM

## 2018-11-28 DIAGNOSIS — I10 Essential (primary) hypertension: Secondary | ICD-10-CM | POA: Diagnosis not present

## 2018-11-28 DIAGNOSIS — M79605 Pain in left leg: Secondary | ICD-10-CM | POA: Diagnosis not present

## 2018-11-28 DIAGNOSIS — Z789 Other specified health status: Secondary | ICD-10-CM

## 2018-11-28 DIAGNOSIS — I2510 Atherosclerotic heart disease of native coronary artery without angina pectoris: Secondary | ICD-10-CM

## 2018-11-28 DIAGNOSIS — I480 Paroxysmal atrial fibrillation: Secondary | ICD-10-CM

## 2018-11-28 DIAGNOSIS — R600 Localized edema: Secondary | ICD-10-CM

## 2018-11-28 DIAGNOSIS — I2583 Coronary atherosclerosis due to lipid rich plaque: Secondary | ICD-10-CM

## 2018-11-28 NOTE — Progress Notes (Unsigned)
Unilateral left lower extremity venous duplex exam has been performed. Large hematoma was seen in the calf area.   Jimmy Maude Gloor RDCS, RVT

## 2018-11-28 NOTE — Patient Instructions (Signed)
Medication Instructions:  Your physician recommends that you continue on your current medications as directed. Please refer to the Current Medication list given to you today.  If you need a refill on your cardiac medications before your next appointment, please call your pharmacy.   Lab work: None.  If you have labs (blood work) drawn today and your tests are completely normal, you will receive your results only by: [name] MyChart Message (if you have MyChart) OR . A paper copy in the mail If you have any lab test that is abnormal or we need to change your treatment, we will call you to review the results.  Testing/Procedures: Your physician has requested that you have a lower or upper extremity venous duplex. This test is an ultrasound of the veins in the legs or arms. It looks at venous blood flow that carries blood from the heart to the legs or arms. Allow one hour for a Lower Venous exam. Allow thirty minutes for an Upper Venous exam. There are no restrictions or special instructions.     Follow-Up: At [facility], you and your health needs are our priority.  As part of our continuing mission to provide you with exceptional heart care, we have created designated Provider Care Teams.  These Care Teams include your primary Cardiologist (physician) and Advanced Practice Providers (APPs -  Physician Assistants and Nurse Practitioners) who all work together to provide you with the care you need, when you need it. You will need a follow up appointment in 3 months.  Please call our office 2 months in advance to schedule this appointment.  You may see Gypsy Balsam, MD or another member of our [facility] Provider Team in [place]: Norman Herrlich, MD . Belva Crome, MD  Any Other Special Instructions Will Be Listed Below (If Applicable).

## 2018-11-28 NOTE — Progress Notes (Signed)
Cardiology Office Note:    Date:  11/28/2018   ID:  Judith Flores, DOB 06-04-1934, MRN [id number]  PCP:  Judith Richmond, MD  Cardiologist:  Judith Balsam, MD    Referring MD: Judith Richmond, MD   No chief complaint on file. I have right with mild left leg  History of Present Illness:    Judith Flores is a 85 y.o. female who had stenting done of LAD in December 2019 comes today to my office for follow-up the problem is she got asymmetrical swelling of lower extremities left calf is swollen and tender she said it is much better than it used to be it was twice the size it is today and it is already being.  Overall it is getting better.  No chest pain tightness squeezing pressure burning chest she could not tolerate Zetia she thinks the swelling of her legs is related to Zetia.  She denies have any chest other cardiac issues.  Past Medical History:  Diagnosis Date  . Anemia   . CAD (coronary artery disease)    a. 08/2018 NSTEMI/PCI: LM nl, LAD 95p/m (3.5 x 12 mm resolute Onyx DES and 2.5 x 38 mm resolute Onyx DES successfully placed), D1 100, D2 40, RI 95 (2.25 x 38 mm resolute Onyx DES), LCx 30p, RCA 32m 40d.  . CKD (chronic kidney disease), stage III (HCC) 09/04/2018  . Complete heart block (HCC)    intermittent  . Epigastric pain 09/04/2018  . GERD (gastroesophageal reflux disease)   . HOH (hard of hearing)   . HTN (hypertension)   . Ischemic cardiomyopathy    a. 08/2018 Echo: Ef 50-55%, mild apical anteroseptal and apical HK. Gr1 DD. Triv TR. PaSP .  [name] Pacemaker   . PAF (paroxysmal atrial fibrillation) (HCC)   . Peripheral edema   . Recurrent upper respiratory infection (URI)    HX OF CHRONIC BRONCHITIS  . Syncope     Past Surgical History:  Procedure Laterality Date  . breast tumor    . CORONARY STENT INTERVENTION N/A 09/07/2018   Procedure: CORONARY STENT INTERVENTION;  Surgeon: Tonny Bollman, MD;  Location: [facility] INVASIVE CV LAB;  Service: Cardiovascular;   Laterality: N/A;  . INSERT / REPLACE / REMOVE PACEMAKER     St [name]  . LEFT HEART CATH AND CORONARY ANGIOGRAPHY N/A 09/06/2018   Procedure: LEFT HEART CATH AND CORONARY ANGIOGRAPHY;  Surgeon: Tonny Bollman, MD;  Location: [facility] INVASIVE CV LAB;  Service: Cardiovascular;  Laterality: N/A;  . PACEMAKER REVISION N/A 12/01/2011   Procedure: PACEMAKER REVISION;  Surgeon: [name], MD;  Location: [facility] CATH LAB;  Service: Cardiovascular;  Laterality: N/A;  . PPM GENERATOR CHANGEOUT N/A 01/12/2018   Procedure: PPM GENERATOR CHANGEOUT;  Surgeon: [name], MD;  Location: [facility] INVASIVE CV LAB;  Service: Cardiovascular;  Laterality: N/A;    Current Medications: Current Meds  Medication Sig  . Cyanocobalamin (VITAMIN B-12 IJ) Inject 1,000 mg as directed every 30 (thirty) days.   [name] ezetimibe (ZETIA) 10 MG tablet Take 1 tablet (10 mg total) by mouth daily.  . ferrous sulfate 325 (65 FE) MG EC tablet Take 1 tablet (325 mg total) by mouth 3 (three) times daily with meals. (Patient taking differently: Take 325 mg by mouth daily with breakfast. )  . furosemide (LASIX) 20 MG tablet Take 20 mg by mouth as needed for edema.  . hydrochlorothiazide (MICROZIDE) 12.5 MG capsule Take 12.5 mg by mouth daily.  . metoprolol tartrate (LOPRESSOR) 50  MG tablet Take 1-2 tablets (50-100 mg total) by mouth See admin instructions. 100mg  (2 pills) in AM 50mg  (1 pill) in PM  . nitroGLYCERIN (NITROSTAT) 0.4 MG SL tablet Place 1 tablet (0.4 mg total) under the tongue every 5 (five) minutes x 3 doses as needed for chest pain.  [name] omeprazole (PRILOSEC) 40 MG capsule Take 40 mg by mouth daily.  . Rivaroxaban (XARELTO) 15 MG TABS tablet Take 1 tablet (15 mg total) by mouth daily.  . Vitamin D, Ergocalciferol, (DRISDOL) 1.25 MG (50000 UT) CAPS capsule Take 50,000 Units by mouth every 7 (seven) days.     Allergies:   Atorvastatin; Crestor [rosuvastatin calcium]; Niacin; Sulfa antibiotics; and Penicillins   Social History    Socioeconomic History  . Marital status: Widowed    Spouse name: Not on file  . Number of children: Not on file  . Years of education: Not on file  . Highest education level: Not on file  Occupational History  . Not on file  Social Needs  . Financial resource strain: Not on file  . Food insecurity:    Worry: Patient refused    Inability: Patient refused  . Transportation needs:    Medical: Patient refused    Non-medical: Patient refused  Tobacco Use  . Smoking status: Never Smoker  . Smokeless tobacco: Never Used  Substance and Sexual Activity  . Alcohol use: No  . Drug use: No  . Sexual activity: Never    Birth control/protection: Post-menopausal  Lifestyle  . Physical activity:    Days per week: Patient refused    Minutes per session: Patient refused  . Stress: Not on file  Relationships  . Social connections:    Talks on phone: Patient refused    Gets together: Patient refused    Attends religious service: Patient refused    Active member of club or organization: Patient refused    Attends meetings of clubs or organizations: Patient refused    Relationship status: Patient refused  Other Topics Concern  . Not on file  Social History Narrative  . Not on file     Family History: The patient's family history includes Heart disease in her father and mother. ROS:   Please see the history of present illness.    All 14 point review of systems negative except as described per history of present illness  EKGs/Labs/Other Studies Reviewed:      Recent Labs: 09/08/2018: Hemoglobin 12.1; Platelets 139 10/19/2018: BUN 21; Creatinine, Ser 1.37; Potassium 3.6; Sodium 141  Recent Lipid Panel    Component Value Date/Time   CHOL 193 09/05/2018 0048   TRIG 187 (H) 09/05/2018 0048   HDL 31 (L) 09/05/2018 0048   CHOLHDL 6.2 09/05/2018 0048   VLDL 37 09/05/2018 0048   LDLCALC 125 (H) 09/05/2018 0048    Physical Exam:    VS:  BP 112/62 (BP Location: Right Arm, Patient  Position: Sitting, Cuff Size: Normal)   Pulse 74   Ht 5\' 6"  (1.676 m)   Wt 195 lb (88.5 kg)   SpO2 98%   BMI 31.47 kg/m     Wt Readings from Last 3 Encounters:  11/28/18 195 lb (88.5 kg)  10/19/18 202 lb 12.8 oz (92 kg)  09/17/18 210 lb (95.3 kg)     GEN:  Well nourished, well developed in no acute distress HEENT: Normal NECK: No JVD; No carotid bruits LYMPHATICS: No lymphadenopathy CARDIAC: RRR, no murmurs, no rubs, no gallops RESPIRATORY:  Clear to auscultation  without rales, wheezing or rhonchi  ABDOMEN: Soft, non-tender, non-distended MUSCULOSKELETAL:  No edema; No deformity  SKIN: Warm and dry LOWER EXTREMITIES: no swelling NEUROLOGIC:  Alert and oriented x 3 PSYCHIATRIC:  Normal affect   ASSESSMENT:    1. Leg swelling   2. Pain of left lower extremity   3. Coronary artery disease due to lipid rich plaque   4. Essential hypertension   5. Paroxysmal atrial fibrillation (HCC)   6. Bilateral lower extremity edema   7. Statin intolerance    PLAN:    In order of problems listed above:  1. Leg swelling asymmetrical we will do carotid venous duplex to make sure we are not dealing with DVT which will be unlikely considering the fact that she takes Xarelto.  However with this asymmetrical swelling it some months to do the test. 2. Coronary disease stable from that point review we will continue present management. 3. Essential hypertension blood pressure well controlled we will continue present management. 4. Paroxysmal atrial fibrillation anticoagulated which I will continue denies have any palpitations. 5. Statin intolerance she also intolerant to Zetia we initiated conversation about PCSK9 agent   Medication Adjustments/Labs and Tests Ordered: Current medicines are reviewed at length with the patient today.  Concerns regarding medicines are outlined above.  No orders of the defined types were placed in this encounter.  Medication changes: No orders of the defined  types were placed in this encounter.   Signed, Georgeanna Lea, MD, [facility] 11/28/2018 10:07 AM    [facility]

## 2018-12-18 ENCOUNTER — Other Ambulatory Visit: Payer: Self-pay

## 2018-12-18 ENCOUNTER — Ambulatory Visit: Payer: Medicare Other | Admitting: Cardiology

## 2018-12-18 ENCOUNTER — Ambulatory Visit (INDEPENDENT_AMBULATORY_CARE_PROVIDER_SITE_OTHER): Payer: Medicare Other | Admitting: *Deleted

## 2018-12-18 DIAGNOSIS — I442 Atrioventricular block, complete: Secondary | ICD-10-CM | POA: Diagnosis not present

## 2018-12-18 LAB — CUP PACEART REMOTE DEVICE CHECK
Battery Remaining Longevity: 106 mo
Battery Remaining Percentage: 95.5 %
Battery Voltage: 2.99 V
Brady Statistic AP VP Percent: 82 %
Brady Statistic AP VS Percent: 1 %
Brady Statistic AS VP Percent: 17 %
Brady Statistic AS VS Percent: 1 %
Brady Statistic RA Percent Paced: 79 %
Brady Statistic RV Percent Paced: 99 %
Date Time Interrogation Session: 20200407124038
Implantable Lead Implant Date: 20130321
Implantable Lead Implant Date: 20130321
Implantable Lead Location: 753859
Implantable Lead Location: 753860
Implantable Pulse Generator Implant Date: 20190503
Lead Channel Impedance Value: 390 Ohm
Lead Channel Impedance Value: 630 Ohm
Lead Channel Pacing Threshold Amplitude: 0.625 V
Lead Channel Pacing Threshold Amplitude: 0.875 V
Lead Channel Pacing Threshold Pulse Width: 0.4 ms
Lead Channel Pacing Threshold Pulse Width: 0.4 ms
Lead Channel Sensing Intrinsic Amplitude: 1.5 mV
Lead Channel Sensing Intrinsic Amplitude: 12 mV
Lead Channel Setting Pacing Amplitude: 0.875
Lead Channel Setting Pacing Amplitude: 2 V
Lead Channel Setting Pacing Pulse Width: 0.4 ms
Lead Channel Setting Sensing Sensitivity: 6 mV
Pulse Gen Model: 2272
Pulse Gen Serial Number: 9019281

## 2018-12-27 ENCOUNTER — Encounter: Payer: Self-pay | Admitting: Cardiology

## 2018-12-27 NOTE — Progress Notes (Signed)
Remote pacemaker transmission.   

## 2019-02-11 ENCOUNTER — Telehealth: Payer: Self-pay | Admitting: Cardiology

## 2019-02-11 NOTE — Telephone Encounter (Signed)
Virtual Visit Pre-Appointment Phone Call  "(Name), I am calling you today to discuss your upcoming appointment. We are currently trying to limit exposure to the virus that causes COVID-19 by seeing patients at home rather than in the office."  1. "What is the BEST phone number to call the day of the visit?" - include this in appointment notes  2. Do you have or have access to (through a family member/friend) a smartphone with video capability that we can use for your visit?" a. If yes - list this number in appt notes as cell (if different from BEST phone #) and list the appointment type as a VIDEO visit in appointment notes b. If no - list the appointment type as a PHONE visit in appointment notes  3. Confirm consent - "In the setting of the current Covid19 crisis, you are scheduled for a (phone or video) visit with your provider on (date) at (time).  Just as we do with many in-office visits, in order for you to participate in this visit, we must obtain consent.  If you'd like, I can send this to your mychart (if signed up) or email for you to review.  Otherwise, I can obtain your verbal consent now.  All virtual visits are billed to your insurance company just like a normal visit would be.  By agreeing to a virtual visit, we'd like you to understand that the technology does not allow for your provider to perform an examination, and thus may limit your provider's ability to fully assess your condition. If your provider identifies any concerns that need to be evaluated in person, we will make arrangements to do so.  Finally, though the technology is pretty good, we cannot assure that it will always work on either your or our end, and in the setting of a video visit, we may have to convert it to a phone-only visit.  In either situation, we cannot ensure that we have a secure connection.  Are you willing to proceed?" STAFF: Did the patient verbally acknowledge consent to telehealth visit? Document  YES/NO here: YES  4. Advise patient to be prepared - "Two hours prior to your appointment, go ahead and check your blood pressure, pulse, oxygen saturation, and your weight (if you have the equipment to check those) and write them all down. When your visit starts, your provider will ask you for this information. If you have an Apple Watch or Kardia device, please plan to have heart rate information ready on the day of your appointment. Please have a pen and paper handy nearby the day of the visit as well."  5. Give patient instructions for MyChart download to smartphone OR Doximity/Doxy.me as below if video visit (depending on what platform provider is using)  6. Inform patient they will receive a phone call 15 minutes prior to their appointment time (may be from unknown caller ID) so they should be prepared to answer    TELEPHONE CALL NOTE  Judith Flores has been deemed a candidate for a follow-up tele-health visit to limit community exposure during the Covid-19 pandemic. I spoke with the patient via phone to ensure availability of phone/video source, confirm preferred email & phone number, and discuss instructions and expectations.  I reminded Judith Flores to be prepared with any vital sign and/or heart rhythm information that could potentially be obtained via home monitoring, at the time of her visit. I reminded Judith Flores to expect a phone call prior to her visit.  Judith Flores 02/11/2019 11:25 AM   INSTRUCTIONS FOR DOWNLOADING THE MYCHART APP TO SMARTPHONE  - The patient must first make sure to have activated MyChart and know their login information - If Apple, go to [employer] and type in MyChart in the search bar and download the app. If Android, ask patient to go to [employer] Play Store and type in LevasyMyChart in the search bar and download the app. The app is free but as with any other app downloads, their phone may require them to verify saved payment information or Apple/Android  password.  - The patient will need to then log into the app with their MyChart username and password, and select  as their healthcare provider to link the account. When it is time for your visit, go to the MyChart app, find appointments, and click Begin Video Visit. Be sure to Select Allow for your device to access the Microphone and Camera for your visit. You will then be connected, and your provider will be with you shortly.  **If they have any issues connecting, or need assistance please contact MyChart service desk [phone])**  **If using a computer, in order to ensure the best quality for their visit they will need to use either of the following Internet Browsers: [employer], or Google Chrome**  IF USING DOXIMITY or DOXY.ME - The patient will receive a link just prior to their visit by text.     FULL LENGTH CONSENT FOR TELE-HEALTH VISIT   I hereby voluntarily request, consent and authorize [facility] and its employed or contracted physicians, physician assistants, nurse practitioners or other licensed health care professionals (the Practitioner), to provide me with telemedicine health care services (the Services") as deemed necessary by the treating Practitioner. I acknowledge and consent to receive the Services by the Practitioner via telemedicine. I understand that the telemedicine visit will involve communicating with the Practitioner through live audiovisual communication technology and the disclosure of certain medical information by electronic transmission. I acknowledge that I have been given the opportunity to request an in-person assessment or other available alternative prior to the telemedicine visit and am voluntarily participating in the telemedicine visit.  I understand that I have the right to withhold or withdraw my consent to the use of telemedicine in the course of my care at any time, without affecting my right to future care or treatment,  and that the Practitioner or I may terminate the telemedicine visit at any time. I understand that I have the right to inspect all information obtained and/or recorded in the course of the telemedicine visit and may receive copies of available information for a reasonable fee.  I understand that some of the potential risks of receiving the Services via telemedicine include:   Delay or interruption in medical evaluation due to technological equipment failure or disruption;  Information transmitted may not be sufficient (e.g. poor resolution of images) to allow for appropriate medical decision making by the Practitioner; and/or   In rare instances, security protocols could fail, causing a breach of personal health information.  Furthermore, I acknowledge that it is my responsibility to provide information about my medical history, conditions and care that is complete and accurate to the best of my ability. I acknowledge that Practitioner's advice, recommendations, and/or decision may be based on factors not within their control, such as incomplete or inaccurate data provided by me or distortions of diagnostic images or specimens that may result from electronic transmissions. I understand that the  practice of medicine is not an Chief Strategy Officer and that Practitioner makes no warranties or guarantees regarding treatment outcomes. I acknowledge that I will receive a copy of this consent concurrently upon execution via email to the email address I last provided but may also request a printed copy by calling the office of [place].    I understand that my insurance will be billed for this visit.   I have read or had this consent read to me.  I understand the contents of this consent, which adequately explains the benefits and risks of the Services being provided via telemedicine.   I have been provided ample opportunity to ask questions regarding this consent and the Services and have had my questions  answered to my satisfaction.  I give my informed consent for the services to be provided through the use of telemedicine in my medical care  By participating in this telemedicine visit I agree to the above.

## 2019-02-28 ENCOUNTER — Encounter: Payer: Self-pay | Admitting: Cardiology

## 2019-02-28 ENCOUNTER — Telehealth (INDEPENDENT_AMBULATORY_CARE_PROVIDER_SITE_OTHER): Payer: Medicare Other | Admitting: Cardiology

## 2019-02-28 ENCOUNTER — Other Ambulatory Visit: Payer: Self-pay

## 2019-02-28 VITALS — BP 129/74 | HR 65

## 2019-02-28 DIAGNOSIS — I2510 Atherosclerotic heart disease of native coronary artery without angina pectoris: Secondary | ICD-10-CM

## 2019-02-28 DIAGNOSIS — I480 Paroxysmal atrial fibrillation: Secondary | ICD-10-CM | POA: Diagnosis not present

## 2019-02-28 DIAGNOSIS — Z950 Presence of cardiac pacemaker: Secondary | ICD-10-CM

## 2019-02-28 DIAGNOSIS — I442 Atrioventricular block, complete: Secondary | ICD-10-CM

## 2019-02-28 DIAGNOSIS — I2583 Coronary atherosclerosis due to lipid rich plaque: Secondary | ICD-10-CM

## 2019-02-28 NOTE — Progress Notes (Signed)
Virtual Visit via Telephone Note   This visit type was conducted due to national recommendations for restrictions regarding the COVID-19 Pandemic (e.g. social distancing) in an effort to limit this patient's exposure and mitigate transmission in our community.  Due to her co-morbid illnesses, this patient is at least at moderate risk for complications without adequate follow up.  This format is felt to be most appropriate for this patient at this time.  The patient did not have access to video technology/had technical difficulties with video requiring transitioning to audio format only (telephone).  All issues noted in this document were discussed and addressed.  No physical exam could be performed with this format.  Please refer to the patient's chart for her  consent to telehealth for [facility].  Evaluation Performed:  Follow-up visit  This visit type was conducted due to national recommendations for restrictions regarding the COVID-19 Pandemic (e.g. social distancing).  This format is felt to be most appropriate for this patient at this time.  All issues noted in this document were discussed and addressed.  No physical exam was performed (except for noted visual exam findings with Video Visits).  Please refer to the patient's chart (MyChart message for video visits and phone note for telephone visits) for the patient's consent to telehealth [facility].  Date:  02/28/2019  ID: Judith Flores, DOB [birth year], MRN [id number]   Patient Location: [address] [place]  [id number]   [facility]   [facility] [place] [facility]  PCP:  Carmin Richmondavis, James W, MD  Cardiologist:  Gypsy Balsamobert Serigne Kubicek, MD     Chief Complaint: Doing much better  History of Present Illness:    Judith Flores is a 85 y.o. female  who presents via audio/video conferencing for a telehealth visit today.  The past medical history significant for coronary artery disease, status post intervention in the  December 2019 in face of non-STEMI with PTCA to proximal LAD with drug-eluting stent.,  Hypertension, Saint [name] pacemaker, dyslipidemia last time of syncope she was complaining of having swelling of lower extremities with a DVT study try to identify if she got any significant DVT.  Study was negative for it but she was identified to have some hematoma in the left calf.  Overall since that time she is doing better but still described to have some pain in her calf but overall dramatically improved she is able to take care of her activities daily living as well to take care of her household she walks outside some.  Denies having any chest pain tightness squeezing pressure burning chest   The patient does not have symptoms concerning for COVID-19 infection (fever, chills, cough, or new SHORTNESS OF BREATH).    Prior CV studies:   The following studies were reviewed today:  DVT study of lower extremities was negative but revealed left calf hematoma     Past Medical History:  Diagnosis Date  . Anemia   . CAD (coronary artery disease)    a. 08/2018 NSTEMI/PCI: LM nl, LAD 95p/m (3.5 x 12 mm resolute Onyx DES and 2.5 x 38 mm resolute Onyx DES successfully placed), D1 100, D2 40, RI 95 (2.25 x 38 mm resolute Onyx DES), LCx 30p, RCA 7042m 40d.  . CKD (chronic kidney disease), stage III (HCC) 09/04/2018  . Complete heart block (HCC)    intermittent  . Epigastric pain 09/04/2018  . GERD (gastroesophageal reflux disease)   . HOH (hard of hearing)   . HTN (hypertension)   .  Ischemic cardiomyopathy    a. 08/2018 Echo: Ef 50-55%, mild apical anteroseptal and apical HK. Gr1 DD. Triv TR. PaSP 13mmHg.  [name]. Pacemaker   . PAF (paroxysmal atrial fibrillation) (HCC)   . Peripheral edema   . Recurrent upper respiratory infection (URI)    HX OF CHRONIC BRONCHITIS  . Syncope     Past Surgical History:  Procedure Laterality Date  . breast tumor    . CORONARY STENT INTERVENTION N/A 09/07/2018   Procedure:  CORONARY STENT INTERVENTION;  Surgeon: Tonny Bollmanooper, Michael, MD;  [facility];  Service: Cardiovascular;  Laterality: N/A;  . INSERT / REPLACE / REMOVE PACEMAKER     St [name]  . LEFT HEART CATH AND CORONARY ANGIOGRAPHY N/A 09/06/2018   Procedure: LEFT HEART CATH AND CORONARY ANGIOGRAPHY;  Surgeon: Tonny Bollmanooper, Michael, MD;  [facility];  Service: Cardiovascular;  Laterality: N/A;  . PACEMAKER REVISION N/A 12/01/2011   Procedure: PACEMAKER REVISION;  Surgeon: Duke SalviaSteven C Klein, MD;  [facility];  Service: Cardiovascular;  Laterality: N/A;  . PPM GENERATOR CHANGEOUT N/A 01/12/2018   Procedure: PPM GENERATOR CHANGEOUT;  Surgeon: Duke SalviaKlein, Steven C, MD;  [facility];  Service: Cardiovascular;  Laterality: N/A;     Current Meds  Medication Sig  . clopidogrel (PLAVIX) 75 MG tablet Take 1 tablet (75 mg total) by mouth daily with breakfast.  . ezetimibe (ZETIA) 10 MG tablet Take 1 tablet (10 mg total) by mouth daily.  . ferrous sulfate 325 (65 FE) MG EC tablet Take 1 tablet (325 mg total) by mouth 3 (three) times daily with meals. (Patient taking differently: Take 325 mg by mouth daily with breakfast. )  . furosemide (LASIX) 20 MG tablet Take 20 mg by mouth as needed for edema.  . hydrochlorothiazide (MICROZIDE) 12.5 MG capsule Take 12.5 mg by mouth daily.  . metoprolol tartrate (LOPRESSOR) 50 MG tablet Take 1-2 tablets (50-100 mg total) by mouth See admin instructions. 100mg  (2 pills) in AM 50mg  (1 pill) in PM  . nitroGLYCERIN (NITROSTAT) 0.4 MG SL tablet Place 1 tablet (0.4 mg total) under the tongue every 5 (five) minutes x 3 doses as needed for chest pain.  [name]. omeprazole (PRILOSEC) 40 MG capsule Take 40 mg by mouth daily.  . Rivaroxaban (XARELTO) 15 MG TABS tablet Take 1 tablet (15 mg total) by mouth daily.  . Vitamin D, Ergocalciferol, (DRISDOL) 1.25 MG (50000 UT) CAPS capsule Take 50,000 Units by mouth every 7 (seven) days.      Family History: The  patient's family history includes Heart disease in her father and mother.   ROS:   Please see the history of present illness.     All other systems reviewed and are negative.   Labs/Other Tests and Data Reviewed:     Recent Labs: 09/08/2018: Hemoglobin 12.1; Platelets 139 10/19/2018: BUN 21; Creatinine, Ser 1.37; Potassium 3.6; Sodium 141  Recent Lipid Panel    Component Value Date/Time   CHOL 193 09/05/2018 0048   TRIG 187 (H) 09/05/2018 0048   HDL 31 (L) 09/05/2018 0048   CHOLHDL 6.2 09/05/2018 0048   VLDL 37 09/05/2018 0048   LDLCALC 125 (H) 09/05/2018 0048      Exam:    Vital Signs:  BP 129/74   Pulse 65     Wt Readings from Last 3 Encounters:  11/28/18 195 lb (88.5 kg)  10/19/18 202 lb 12.8 oz (92 kg)  09/17/18 210 lb (95.3 kg)  Well nourished, well developed in no acute distress. Alert oriented x3 talking to me over the phone unable to establish video link quite cheerful and happy  Diagnosis for this visit:   1. Paroxysmal atrial fibrillation (HCC)   2. Coronary artery disease due to lipid rich plaque   3. Atrioventricular block, complete ([place])   4. Pacemaker-St Judes      ASSESSMENT & PLAN:    1.  Paroxysmal atrial fibrillation burden of atrial fibrillation is less than 5% based on pacemaker interrogation, she is anticoagulated which I will continue.  Asymptomatic 2.  Coronary artery disease status post PTCA and stenting of drug-eluting stent to proximal LAD done in December last year, will maintain her on Plavix as well as Xarelto, no aspirin 3.  Complete heart block status post pacemaker implantation I reviewed her remote interrogation of the device normal function.  She wished to transfer to our office in [place], therefore I will schedule her to see Dr. Curt Bears.  COVID-19 Education: The signs and symptoms of COVID-19 were discussed with the patient and how to seek care for testing (follow up with PCP or arrange E-visit).  The importance of social  distancing was discussed today.  Patient Risk:   After full review of this patients clinical status, I feel that they are at least moderate risk at this time.  Time:   Today, I have spent 15 minutes with the patient with telehealth technology discussing pt health issues.  I spent 5 minutes reviewing her chart before the visit.  Visit was finished at 11 AM.    Medication Adjustments/Labs and Tests Ordered: Current medicines are reviewed at length with the patient today.  Concerns regarding medicines are outlined above.  No orders of the defined types were placed in this encounter.  Medication changes: No orders of the defined types were placed in this encounter.    Disposition: Follow-up in 5 months  Signed, Park Liter, MD, [facility] 02/28/2019 10:59 AM    [address]

## 2019-02-28 NOTE — Addendum Note (Signed)
Addended by: Linna Hoff R on: 02/28/2019 11:10 AM   Modules accepted: Orders

## 2019-02-28 NOTE — Patient Instructions (Signed)
Medication Instructions:  Your physician recommends that you continue on your current medications as directed. Please refer to the Current Medication list given to you today.  If you need a refill on your cardiac medications before your next appointment, please call your pharmacy.   Lab work: None.  If you have labs (blood work) drawn today and your tests are completely normal, you will receive your results only by: [name] MyChart Message (if you have MyChart) OR . A paper copy in the mail If you have any lab test that is abnormal or we need to change your treatment, we will call you to review the results.  Testing/Procedures: None   Follow-Up: At [facility], you and your health needs are our priority.  As part of our continuing mission to provide you with exceptional heart care, we have created designated Provider Care Teams.  These Care Teams include your primary Cardiologist (physician) and Advanced Practice Providers (APPs -  Physician Assistants and Nurse Practitioners) who all work together to provide you with the care you need, when you need it. You will need a follow up appointment in 5 months.  Please call our office 2 months in advance to schedule this appointment.  You may see Jenne Campus, MD or another member of our [place] Provider Team in [place]: Shirlee More, MD . Jyl Heinz, MD  Any Other Special Instructions Will Be Listed Below (If Applicable).  Dr. Agustin Cree has referred you to Dr. Curt Bears. Please await their call to get you scheduled. If you do not hear from them within 1 week please call our office.

## 2019-03-19 ENCOUNTER — Ambulatory Visit (INDEPENDENT_AMBULATORY_CARE_PROVIDER_SITE_OTHER): Payer: Medicare Other | Admitting: *Deleted

## 2019-03-19 DIAGNOSIS — I442 Atrioventricular block, complete: Secondary | ICD-10-CM | POA: Diagnosis not present

## 2019-03-19 LAB — CUP PACEART REMOTE DEVICE CHECK
Battery Remaining Longevity: 113 mo
Battery Remaining Percentage: 95.5 %
Battery Voltage: 2.99 V
Brady Statistic AP VP Percent: 84 %
Brady Statistic AP VS Percent: 1 %
Brady Statistic AS VP Percent: 16 %
Brady Statistic AS VS Percent: 1 %
Brady Statistic RA Percent Paced: 81 %
Brady Statistic RV Percent Paced: 99 %
Date Time Interrogation Session: 20200707074633
Implantable Lead Implant Date: 20130321
Implantable Lead Implant Date: 20130321
Implantable Lead Location: 753859
Implantable Lead Location: 753860
Implantable Pulse Generator Implant Date: 20190503
Lead Channel Impedance Value: 390 Ohm
Lead Channel Impedance Value: 660 Ohm
Lead Channel Pacing Threshold Amplitude: 0.625 V
Lead Channel Pacing Threshold Amplitude: 0.75 V
Lead Channel Pacing Threshold Pulse Width: 0.4 ms
Lead Channel Pacing Threshold Pulse Width: 0.4 ms
Lead Channel Sensing Intrinsic Amplitude: 1.3 mV
Lead Channel Sensing Intrinsic Amplitude: 10.2 mV
Lead Channel Setting Pacing Amplitude: 0.875
Lead Channel Setting Pacing Amplitude: 2 V
Lead Channel Setting Pacing Pulse Width: 0.4 ms
Lead Channel Setting Sensing Sensitivity: 6 mV
Pulse Gen Model: 2272
Pulse Gen Serial Number: 9019281

## 2019-03-26 ENCOUNTER — Other Ambulatory Visit: Payer: Self-pay

## 2019-03-26 ENCOUNTER — Other Ambulatory Visit: Payer: Self-pay | Admitting: *Deleted

## 2019-03-26 MED ORDER — RIVAROXABAN 15 MG PO TABS: 15.0000 mg | ORAL_TABLET | Freq: Every day | ORAL | 4 refills | Status: DC

## 2019-03-26 MED ORDER — CLOPIDOGREL BISULFATE 75 MG PO TABS: 75.0000 mg | ORAL_TABLET | Freq: Every day | ORAL | 6 refills | Status: DC

## 2019-03-26 NOTE — Telephone Encounter (Signed)
Rx for Xarelto 15mg  one tablet daily sent to [facility] as requested.

## 2019-03-30 ENCOUNTER — Encounter: Payer: Self-pay | Admitting: Cardiology

## 2019-03-30 NOTE — Progress Notes (Signed)
Remote pacemaker transmission.   

## 2019-05-11 ENCOUNTER — Other Ambulatory Visit: Payer: Self-pay | Admitting: Cardiology

## 2019-05-13 ENCOUNTER — Ambulatory Visit (INDEPENDENT_AMBULATORY_CARE_PROVIDER_SITE_OTHER): Payer: Medicare Other | Admitting: Cardiology

## 2019-05-13 ENCOUNTER — Encounter: Payer: Self-pay | Admitting: Cardiology

## 2019-05-13 ENCOUNTER — Other Ambulatory Visit: Payer: Self-pay

## 2019-05-13 VITALS — BP 134/76 | HR 75 | Ht 66.0 in | Wt 196.0 lb

## 2019-05-13 DIAGNOSIS — I480 Paroxysmal atrial fibrillation: Secondary | ICD-10-CM

## 2019-05-13 DIAGNOSIS — Z950 Presence of cardiac pacemaker: Secondary | ICD-10-CM | POA: Diagnosis not present

## 2019-05-13 DIAGNOSIS — I442 Atrioventricular block, complete: Secondary | ICD-10-CM | POA: Diagnosis not present

## 2019-05-13 NOTE — Progress Notes (Signed)
Electrophysiology Office Note   Date:  05/13/2019   ID:  Judith Flores, DOB 04-Feb-1934, MRN [id number]  PCP:  Carmin Richmondavis, James W, MD  Cardiologist:  Bing MatterKrasowski Primary Electrophysiologist:  Judith Giacobbe Jorja LoaMartin Milika Ventress, MD    No chief complaint on file.    History of Present Illness: Judith ShorterBarbara Flores is a 85 y.o. female who is being seen today for the evaluation of pacemaker at the request of Carmin Richmondavis, James W, MD. Presenting today for electrophysiology evaluation.  She has a history of coronary artery disease status post stent in December 2019 after non-STEMI with PCI to the LAD, hypertension, hyperlipidemia, Saint [name] dual-chamber pacemaker.  Today, she denies symptoms of palpitations, chest pain, shortness of breath, orthopnea, PND, lower extremity edema, claudication, dizziness, presyncope, syncope, bleeding, or neurologic sequela. The patient is tolerating medications without difficulties.    Past Medical History:  Diagnosis Date  . Anemia   . CAD (coronary artery disease)    a. 08/2018 NSTEMI/PCI: LM nl, LAD 95p/m (3.5 x 12 mm resolute Onyx DES and 2.5 x 38 mm resolute Onyx DES successfully placed), D1 100, D2 40, RI 95 (2.25 x 38 mm resolute Onyx DES), LCx 30p, RCA 676m 40d.  . CKD (chronic kidney disease), stage III (HCC) 09/04/2018  . Complete heart block (HCC)    intermittent  . Epigastric pain 09/04/2018  . GERD (gastroesophageal reflux disease)   . HOH (hard of hearing)   . HTN (hypertension)   . Ischemic cardiomyopathy    a. 08/2018 Echo: Ef 50-55%, mild apical anteroseptal and apical HK. Gr1 DD. Triv TR. PaSP 13mmHg.  [name]. Pacemaker   . PAF (paroxysmal atrial fibrillation) (HCC)   . Peripheral edema   . Recurrent upper respiratory infection (URI)    HX OF CHRONIC BRONCHITIS  . Syncope    Past Surgical History:  Procedure Laterality Date  . breast tumor    . CORONARY STENT INTERVENTION N/A 09/07/2018   Procedure: CORONARY STENT INTERVENTION;  Surgeon: Tonny Bollmanooper, Michael, MD;   [facility];  Service: Cardiovascular;  Laterality: N/A;  . INSERT / REPLACE / REMOVE PACEMAKER     St [name]  . LEFT HEART CATH AND CORONARY ANGIOGRAPHY N/A 09/06/2018   Procedure: LEFT HEART CATH AND CORONARY ANGIOGRAPHY;  Surgeon: Tonny Bollmanooper, Michael, MD;  [facility];  Service: Cardiovascular;  Laterality: N/A;  . PACEMAKER REVISION N/A 12/01/2011   Procedure: PACEMAKER REVISION;  Surgeon: Duke SalviaSteven C Klein, MD;  [facility];  Service: Cardiovascular;  Laterality: N/A;  . PPM GENERATOR CHANGEOUT N/A 01/12/2018   Procedure: PPM GENERATOR CHANGEOUT;  Surgeon: Duke SalviaKlein, Steven C, MD;  [facility];  Service: Cardiovascular;  Laterality: N/A;     Current Outpatient Medications  Medication Sig Dispense Refill  . clopidogrel (PLAVIX) 75 MG tablet Take 1 tablet (75 mg total) by mouth daily with breakfast. 30 tablet 6  . Cyanocobalamin (VITAMIN B-12 IJ) Inject 1,000 mg as directed every 30 (thirty) days.     [name]. ezetimibe (ZETIA) 10 MG tablet TAKE 1 TABLET BY MOUTH EVERY DAY 90 tablet 0  . ferrous sulfate 325 (65 FE) MG EC tablet Take 1 tablet (325 mg total) by mouth 3 (three) times daily with meals. 30 tablet 11  . furosemide (LASIX) 20 MG tablet Take 20 mg by mouth as needed for edema.    . hydrochlorothiazide (MICROZIDE) 12.5 MG capsule Take 12.5 mg by mouth daily.    . metoprolol tartrate (LOPRESSOR) 50 MG tablet  Take 1-2 tablets (50-100 mg total) by mouth See admin instructions. 100mg  (2 pills) in AM 50mg  (1 pill) in PM    . nitroGLYCERIN (NITROSTAT) 0.4 MG SL tablet Place 1 tablet (0.4 mg total) under the tongue every 5 (five) minutes x 3 doses as needed for chest pain. 25 tablet 3  . omeprazole (PRILOSEC) 40 MG capsule Take 40 mg by mouth daily.    . Rivaroxaban (XARELTO) 15 MG TABS tablet Take 1 tablet (15 mg total) by mouth daily. 30 tablet 4  . Vitamin D, Ergocalciferol, (DRISDOL) 1.25 MG (50000 UT) CAPS capsule Take 50,000 Units by mouth  every 7 (seven) days.     No current facility-administered medications for this visit.     Allergies:   Atorvastatin, Crestor [rosuvastatin calcium], Niacin, Sulfa antibiotics, and Penicillins   Social History:  The patient  reports that she has never smoked. She has never used smokeless tobacco. She reports that she does not drink alcohol or use drugs.   Family History:  The patient's family history includes Heart disease in her father and mother.    ROS:  Please see the history of present illness.   Otherwise, review of systems is positive for none.   All other systems are reviewed and negative.    PHYSICAL EXAM: VS:  BP 134/76   Pulse 75   Ht 5\' 6"  (1.676 m)   Wt 196 lb (88.9 kg)   SpO2 97%   BMI 31.64 kg/m  , BMI Body mass index is 31.64 kg/m. GEN: Well nourished, well developed, in no acute distress  HEENT: normal  Neck: no JVD, carotid bruits, or masses Cardiac: RRR; no murmurs, rubs, or gallops,no edema  Respiratory:  clear to auscultation bilaterally, normal work of breathing GI: soft, nontender, nondistended, + BS MS: no deformity or atrophy  Skin: warm and dry, device pocket is well healed Neuro:  Strength and sensation are intact Psych: euthymic mood, full affect  EKG:  EKG is ordered today. Personal review of the ekg ordered shows AV paced  Device interrogation is reviewed today in detail.  See PaceArt for details.   Recent Labs: 09/08/2018: Hemoglobin 12.1; Platelets 139 10/19/2018: BUN 21; Creatinine, Ser 1.37; Potassium 3.6; Sodium 141    Lipid Panel     Component Value Date/Time   CHOL 193 09/05/2018 0048   TRIG 187 (H) 09/05/2018 0048   HDL 31 (L) 09/05/2018 0048   CHOLHDL 6.2 09/05/2018 0048   VLDL 37 09/05/2018 0048   LDLCALC 125 (H) 09/05/2018 0048     Wt Readings from Last 3 Encounters:  05/13/19 196 lb (88.9 kg)  11/28/18 195 lb (88.5 kg)  10/19/18 202 lb 12.8 oz (92 kg)      Other studies Reviewed: Additional studies/ records that  were reviewed today include: TTE 09/04/18  Review of the above records today demonstrates:  - Left ventricle: The cavity size was normal. Systolic function was   normal. The estimated ejection fraction was in the range of 50%   to 55%. Mild hypokinesis of the apical anteroseptal and apical   myocardium. Doppler parameters are consistent with abnormal left   ventricular relaxation (grade 1 diastolic dysfunction). Doppler   parameters are consistent with indeterminate ventricular filling   pressure. - Aortic valve: Transvalvular velocity was within the normal range.   There was no stenosis. There was no regurgitation. Valve area   (VTI): 2.48 cm^2. Valve area (Vmean): 2.49 cm^2. - Mitral valve: Transvalvular velocity was within the normal  range.   There was no evidence for stenosis. There was no regurgitation. - Right ventricle: The cavity size was normal. Wall thickness was   normal. Systolic function was normal. - Atrial septum: No defect or patent foramen ovale was identified   by color flow Doppler. - Tricuspid valve: There was trivial regurgitation. - Pulmonary arteries: Systolic pressure was within the normal   range. PA peak pressure: 13 mm Hg (S).  LHC 09/07/18  Prox LAD to Mid LAD lesion is 95% stenosed.  Ost 1st Diag lesion is 100% stenosed.  Ost 2nd Diag to 2nd Diag lesion is 40% stenosed.  Prox Cx lesion is 30% stenosed with 30% stenosed side branch in Ost 1st Mrg.  Mid RCA lesion is 70% stenosed.  Dist RCA lesion is 40% stenosed.  Ost Ramus to Ramus lesion is 95% stenosed.  A drug-eluting stent was successfully placed using a STENT RESOLUTE ONYX [id number].  Post intervention, there is a 0% residual stenosis.  A drug-eluting stent was successfully placed using a STENT RESOLUTE ONYX [id number].  Post intervention, there is a 0% residual stenosis.   ASSESSMENT AND PLAN:  1.  Paroxysmal atrial fibrillation: Less than 5% based on pacemaker interrogation.  This  patients CHA2DS2-VASc Score and unadjusted Ischemic Stroke Rate (% per year) is equal to 4.8 % stroke rate/year from a score of 4  Above score calculated as 1 point each if present [CHF, HTN, DM, Vascular=MI/PAD/Aortic Plaque, Age if 65-74, or Female] Above score calculated as 2 points each if present [Age > 75, or Stroke/TIA/TE]  2.  Complete heart block: Status post [facility] [name] dual-chamber pacemaker.  Device functioning appropriately.  No changes.  3.  Coronary artery disease: Status post recent LAD stent.  No current chest pain.  No changes.    Current medicines are reviewed at length with the patient today.   The patient does not have concerns regarding her medicines.  The following changes were made today:  none  Labs/ tests ordered today include:  Orders Placed This Encounter  Procedures  . EKG 12-Lead     Disposition:   FU with Adleigh Mcmasters 1 year  Signed, Gerard Bonus Jorja Loa, MD  05/13/2019 10:58 AM     [facility] [address] [place] [address] [phone] (office) [phone] (fax)

## 2019-05-13 NOTE — Patient Instructions (Addendum)
Medication Instructions:  Your physician recommends that you continue on your current medications as directed. Please refer to the Current Medication list given to you today.  *If you need a refill on your cardiac medications before your next appointment, please call your pharmacy*  Labwork: None ordered  Testing/Procedures: None ordered  Follow-Up: Remote monitoring is used to monitor your Pacemaker or ICD from home. This monitoring reduces the number of office visits required to check your device to one time per year. It allows [place] to keep an eye on the functioning of your device to ensure it is working properly. You are scheduled for a device check from home on 06/18/19. You may send your transmission at any time that day. If you have a wireless device, the transmission will be sent automatically. After your physician reviews your transmission, you will receive a postcard with your next transmission date.   Your physician wants you to follow-up in: 1 year with Dr. Curt Bears.  You will receive a reminder letter in the mail two months in advance. If you don't receive a letter, please call our office to schedule the follow-up appointment.  Thank you for choosing [facility]!!   Trinidad Curet, RN [phone]  Any Other Special Instructions Will Be Listed Below (If Applicable).

## 2019-05-30 ENCOUNTER — Telehealth: Payer: Self-pay | Admitting: *Deleted

## 2019-05-30 DIAGNOSIS — I472 Ventricular tachycardia: Secondary | ICD-10-CM

## 2019-05-30 DIAGNOSIS — I4729 Other ventricular tachycardia: Secondary | ICD-10-CM

## 2019-05-30 DIAGNOSIS — I442 Atrioventricular block, complete: Secondary | ICD-10-CM

## 2019-05-30 DIAGNOSIS — I4891 Unspecified atrial fibrillation: Secondary | ICD-10-CM

## 2019-05-30 NOTE — Telephone Encounter (Signed)
Spoke with patient regarding HVR detected on 05/29/19 at 07:23, 10sec duration, avg V rate 163bpm. EGM shows 29bts NSVT. Pt denies symptoms with episode, reports she felt great yesterday. Reports compliance with cardiac meds, including metoprolol, HCTZ, and Xarelto. BP well controlled, usually 110-120/80. Advised pt I will forward this episode to Dr. Curt Bears for review. Will call back if any new recommendations. Pt in agreement with plan. No further questions at this time.

## 2019-05-31 MED ORDER — METOPROLOL TARTRATE 50 MG PO TABS: 100.0000 mg | ORAL_TABLET | Freq: Two times a day (BID) | ORAL | 3 refills | Status: DC

## 2019-05-31 NOTE — Telephone Encounter (Signed)
Spoke with patient to advise of Dr. Macky Lower recommendations. She agrees to increase metoprolol tartrate to 100mg  BID. Prescription sent to preferred pharmacy electronically, though patient does have supply at home. She agrees to call for any signs/symptoms of hypotension, or if she has any other concerns. No further questions at this time.

## 2019-05-31 NOTE — Telephone Encounter (Signed)
If asymptomatic increase metoprolol to 100 mg BID. If symptoms Judith Flores need to follow up in clinic.

## 2019-06-17 ENCOUNTER — Other Ambulatory Visit: Payer: Self-pay

## 2019-06-17 NOTE — Telephone Encounter (Signed)
Please review for refill on NTG. Thanks

## 2019-06-18 ENCOUNTER — Ambulatory Visit (INDEPENDENT_AMBULATORY_CARE_PROVIDER_SITE_OTHER): Payer: Medicare Other | Admitting: *Deleted

## 2019-06-18 DIAGNOSIS — I480 Paroxysmal atrial fibrillation: Secondary | ICD-10-CM

## 2019-06-18 DIAGNOSIS — I442 Atrioventricular block, complete: Secondary | ICD-10-CM

## 2019-06-19 LAB — CUP PACEART REMOTE DEVICE CHECK
Battery Remaining Longevity: 112 mo
Battery Remaining Percentage: 95.5 %
Battery Voltage: 2.99 V
Brady Statistic AP VP Percent: 83 %
Brady Statistic AP VS Percent: 1 %
Brady Statistic AS VP Percent: 17 %
Brady Statistic AS VS Percent: 1 %
Brady Statistic RA Percent Paced: 79 %
Brady Statistic RV Percent Paced: 99 %
Date Time Interrogation Session: 20201006062943
Implantable Lead Implant Date: 20130321
Implantable Lead Implant Date: 20130321
Implantable Lead Location: 753859
Implantable Lead Location: 753860
Implantable Pulse Generator Implant Date: 20190503
Lead Channel Impedance Value: 360 Ohm
Lead Channel Impedance Value: 660 Ohm
Lead Channel Pacing Threshold Amplitude: 0.625 V
Lead Channel Pacing Threshold Amplitude: 0.625 V
Lead Channel Pacing Threshold Pulse Width: 0.4 ms
Lead Channel Pacing Threshold Pulse Width: 0.4 ms
Lead Channel Sensing Intrinsic Amplitude: 1.5 mV
Lead Channel Sensing Intrinsic Amplitude: 12 mV
Lead Channel Setting Pacing Amplitude: 0.875
Lead Channel Setting Pacing Amplitude: 2 V
Lead Channel Setting Pacing Pulse Width: 0.4 ms
Lead Channel Setting Sensing Sensitivity: 6 mV
Pulse Gen Model: 2272
Pulse Gen Serial Number: 9019281

## 2019-06-19 MED ORDER — NITROGLYCERIN 0.4 MG SL SUBL: 0.4000 mg | SUBLINGUAL_TABLET | SUBLINGUAL | 3 refills | Status: AC | PRN

## 2019-06-27 NOTE — Progress Notes (Signed)
Remote pacemaker transmission.   

## 2019-07-02 ENCOUNTER — Telehealth: Payer: Self-pay | Admitting: Physician Assistant

## 2019-07-02 NOTE — Telephone Encounter (Signed)
Pt fell at 11 am. She was sore afterwards.  She took a pain pill and felt a little better.   She has a a large bruise on her calf and her buttocks.   Her son is worried about clots.  I explained that she will have bigger bruises because of the Xarelto.  I explained that it was okay to use ice on them and if she has a compression socks she can put on her calf that may help as well.  I will route this message to Dr. Agustin Cree.    The son said he would really appreciate it if someone called her tomorrow to make sure she is okay.  Rosaria Ferries, PA-C 07/02/2019 6:38 PM Beeper [phone]

## 2019-07-03 NOTE — Telephone Encounter (Signed)
Please call her and ask how is she doing , she felled down yesterday

## 2019-07-03 NOTE — Telephone Encounter (Signed)
Called patient. Asked how she was doing. She reports she is doing much better. Her leg is a little sore. She denies hitting her head, she is able to move around ok. She will call us if anything changes but overall she said she was doing better and appreciated the call.

## 2019-07-19 LAB — CUP PACEART INCLINIC DEVICE CHECK
Battery Remaining Longevity: 109 mo
Battery Voltage: 2.99 V
Brady Statistic RA Percent Paced: 81 %
Brady Statistic RV Percent Paced: 99.44 %
Date Time Interrogation Session: 20200831135011
Implantable Lead Implant Date: 20130321
Implantable Lead Implant Date: 20130321
Implantable Lead Location: 753859
Implantable Lead Location: 753860
Implantable Pulse Generator Implant Date: 20190503
Lead Channel Impedance Value: 400 Ohm
Lead Channel Impedance Value: 662.5 Ohm
Lead Channel Pacing Threshold Amplitude: 0.625 V
Lead Channel Pacing Threshold Amplitude: 0.75 V
Lead Channel Pacing Threshold Pulse Width: 0.4 ms
Lead Channel Pacing Threshold Pulse Width: 0.4 ms
Lead Channel Sensing Intrinsic Amplitude: 1.6 mV
Lead Channel Setting Pacing Amplitude: 0.875
Lead Channel Setting Pacing Amplitude: 2 V
Lead Channel Setting Pacing Pulse Width: 0.4 ms
Lead Channel Setting Sensing Sensitivity: 6 mV
Pulse Gen Model: 2272
Pulse Gen Serial Number: 9019281

## 2019-08-05 ENCOUNTER — Ambulatory Visit: Payer: Medicare Other | Admitting: Cardiology

## 2019-09-16 ENCOUNTER — Other Ambulatory Visit: Payer: Self-pay | Admitting: Cardiology

## 2019-09-17 ENCOUNTER — Ambulatory Visit (INDEPENDENT_AMBULATORY_CARE_PROVIDER_SITE_OTHER): Payer: Medicare Other | Admitting: *Deleted

## 2019-09-17 DIAGNOSIS — I480 Paroxysmal atrial fibrillation: Secondary | ICD-10-CM

## 2019-09-17 LAB — CUP PACEART REMOTE DEVICE CHECK
Battery Remaining Longevity: 112 mo
Battery Remaining Percentage: 95.5 %
Battery Voltage: 2.99 V
Brady Statistic AP VP Percent: 85 %
Brady Statistic AP VS Percent: 1 %
Brady Statistic AS VP Percent: 14 %
Brady Statistic AS VS Percent: 1 %
Brady Statistic RA Percent Paced: 82 %
Brady Statistic RV Percent Paced: 99 %
Date Time Interrogation Session: 20210105040843
Implantable Lead Implant Date: 20130321
Implantable Lead Implant Date: 20130321
Implantable Lead Location: 753859
Implantable Lead Location: 753860
Implantable Pulse Generator Implant Date: 20190503
Lead Channel Impedance Value: 360 Ohm
Lead Channel Impedance Value: 660 Ohm
Lead Channel Pacing Threshold Amplitude: 0.5 V
Lead Channel Pacing Threshold Amplitude: 0.75 V
Lead Channel Pacing Threshold Pulse Width: 0.4 ms
Lead Channel Pacing Threshold Pulse Width: 0.4 ms
Lead Channel Sensing Intrinsic Amplitude: 1.6 mV
Lead Channel Sensing Intrinsic Amplitude: 12 mV
Lead Channel Setting Pacing Amplitude: 0.75 V
Lead Channel Setting Pacing Amplitude: 2 V
Lead Channel Setting Pacing Pulse Width: 0.4 ms
Lead Channel Setting Sensing Sensitivity: 6 mV
Pulse Gen Model: 2272
Pulse Gen Serial Number: 9019281

## 2019-09-19 ENCOUNTER — Other Ambulatory Visit: Payer: Self-pay

## 2019-09-19 ENCOUNTER — Ambulatory Visit (INDEPENDENT_AMBULATORY_CARE_PROVIDER_SITE_OTHER): Payer: Medicare Other | Admitting: Cardiology

## 2019-09-19 ENCOUNTER — Encounter: Payer: Self-pay | Admitting: Cardiology

## 2019-09-19 DIAGNOSIS — I2510 Atherosclerotic heart disease of native coronary artery without angina pectoris: Secondary | ICD-10-CM

## 2019-09-19 DIAGNOSIS — I480 Paroxysmal atrial fibrillation: Secondary | ICD-10-CM

## 2019-09-19 DIAGNOSIS — I10 Essential (primary) hypertension: Secondary | ICD-10-CM

## 2019-09-19 DIAGNOSIS — I2583 Coronary atherosclerosis due to lipid rich plaque: Secondary | ICD-10-CM

## 2019-09-19 DIAGNOSIS — Z789 Other specified health status: Secondary | ICD-10-CM

## 2019-09-19 LAB — LIPID PANEL
Chol/HDL Ratio: 4.4 ratio (ref 0.0–4.4)
Cholesterol, Total: 182 mg/dL (ref 100–199)
HDL: 41 mg/dL (ref >39)
LDL Chol Calc (NIH): 110 mg/dL — ABNORMAL HIGH (ref 0–99)
Triglycerides: 174 mg/dL — ABNORMAL HIGH (ref 0–149)
VLDL Cholesterol Cal: 31 mg/dL (ref 5–40)

## 2019-09-19 NOTE — Patient Instructions (Signed)
Medication Instructions:  Your physician recommends that you continue on your current medications as directed. Please refer to the Current Medication list given to you today.  *If you need a refill on your cardiac medications before your next appointment, please call your pharmacy*  Lab Work: Your physician recommends that you return for lab work today: Lipids   If you have labs (blood work) drawn today and your tests are completely normal, you will receive your results only by: [name] MyChart Message (if you have MyChart) OR . A paper copy in the mail If you have any lab test that is abnormal or we need to change your treatment, we will call you to review the results.  Testing/Procedures: Your physician has requested that you have an echocardiogram. Echocardiography is a painless test that uses sound waves to create images of your heart. It provides your doctor with information about the size and shape of your heart and how well your heart's chambers and valves are working. This procedure takes approximately one hour. There are no restrictions for this procedure.    Follow-Up: At [facility], you and your health needs are our priority.  As part of our continuing mission to provide you with exceptional heart care, we have created designated Provider Care Teams.  These Care Teams include your primary Cardiologist (physician) and Advanced Practice Providers (APPs -  Physician Assistants and Nurse Practitioners) who all work together to provide you with the care you need, when you need it.  Your next appointment:   5 month(s)  The format for your next appointment:   In Person  Provider:   Gypsy Balsam, MD  Other Instructions   Echocardiogram An echocardiogram is a procedure that uses painless sound waves (ultrasound) to produce an image of the heart. Images from an echocardiogram can provide important information about:  Signs of coronary artery disease (CAD).  Aneurysm  detection. An aneurysm is a weak or damaged part of an artery wall that bulges out from the normal force of blood pumping through the body.  Heart size and shape. Changes in the size or shape of the heart can be associated with certain conditions, including heart failure, aneurysm, and CAD.  Heart muscle function.  Heart valve function.  Signs of a past heart attack.  Fluid buildup around the heart.  Thickening of the heart muscle.  A tumor or infectious growth around the heart valves. Tell a health care provider about:  Any allergies you have.  All medicines you are taking, including vitamins, herbs, eye drops, creams, and over-the-counter medicines.  Any blood disorders you have.  Any surgeries you have had.  Any medical conditions you have.  Whether you are pregnant or may be pregnant. What are the risks? Generally, this is a safe procedure. However, problems may occur, including:  Allergic reaction to dye (contrast) that may be used during the procedure. What happens before the procedure? No specific preparation is needed. You may eat and drink normally. What happens during the procedure?   An IV tube may be inserted into one of your veins.  You may receive contrast through this tube. A contrast is an injection that improves the quality of the pictures from your heart.  A gel will be applied to your chest.  A wand-like tool (transducer) will be moved over your chest. The gel will help to transmit the sound waves from the transducer.  The sound waves will harmlessly bounce off of your heart to allow the heart images  to be captured in real-time motion. The images will be recorded on a computer. The procedure may vary among health care providers and hospitals. What happens after the procedure?  You may return to your normal, everyday life, including diet, activities, and medicines, unless your health care provider tells you not to do that. Summary  An  echocardiogram is a procedure that uses painless sound waves (ultrasound) to produce an image of the heart.  Images from an echocardiogram can provide important information about the size and shape of your heart, heart muscle function, heart valve function, and fluid buildup around your heart.  You do not need to do anything to prepare before this procedure. You may eat and drink normally.  After the echocardiogram is completed, you may return to your normal, everyday life, unless your health care provider tells you not to do that. This information is not intended to replace advice given to you by your health care provider. Make sure you discuss any questions you have with your health care provider. Document Revised: 12/20/2018 Document Reviewed: 10/01/2016 Elsevier Patient Education  [place].

## 2019-09-19 NOTE — Progress Notes (Signed)
Cardiology Office Note:    Date:  09/19/2019   ID:  Judith Flores, DOB 19-Jan-1934, MRN [id number]  PCP:  [name], MD  Cardiologist:  Gypsy Balsam, MD    Referring MD: [name], MD   Chief Complaint  Patient presents with  . Follow-up    5 MO FU   Doing well  History of Present Illness:    Judith Flores is a 86 y.o. female past medical history significant for coronary artery disease, status post PTCA and stenting done on Christmas day 04/2018 secondary to non-STEMI related to 95% narrowing of the midportion of LAD that was stented with drug-eluting stent.  Second diagonal branch was completely occluded.  She does have history of hypertension, paroxysmal atrial fibrillation, dual-chamber pacemaker to [facility] device.  She comes today to my office for follow-up she complained of being weak tired exhausted.  No chest pain no tightness no squeezing no pressure no burning in the chest.  She is off statin because of intolerance.  Only thing she takes is Zetia.  I will check a fasting lipid profile today and then decide about therapy.  I think we will be forced to start PCSK9 agent on her.  We had a long discussion about it as well.  Past Medical History:  Diagnosis Date  . Anemia   . CAD (coronary artery disease)    a. 08/2018 NSTEMI/PCI: LM nl, LAD 95p/m (3.5 x 12 mm resolute Onyx DES and 2.5 x 38 mm resolute Onyx DES successfully placed), D1 100, D2 40, RI 95 (2.25 x 38 mm resolute Onyx DES), LCx 30p, RCA 44m 40d.  . CKD (chronic kidney disease), stage III 09/04/2018  . Complete heart block (HCC)    intermittent  . Epigastric pain 09/04/2018  . GERD (gastroesophageal reflux disease)   . HOH (hard of hearing)   . HTN (hypertension)   . Ischemic cardiomyopathy    a. 08/2018 Echo: Ef 50-55%, mild apical anteroseptal and apical HK. Gr1 DD. Triv TR. PaSP .  [name] Pacemaker   . PAF (paroxysmal atrial fibrillation) (HCC)   . Peripheral edema   . Recurrent upper  respiratory infection (URI)    HX OF CHRONIC BRONCHITIS  . Syncope     Past Surgical History:  Procedure Laterality Date  . breast tumor    . CORONARY STENT INTERVENTION N/A 09/07/2018   Procedure: CORONARY STENT INTERVENTION;  Surgeon: Tonny Bollman, MD;  Location: [facility] INVASIVE CV LAB;  Service: Cardiovascular;  Laterality: N/A;  . INSERT / REPLACE / REMOVE PACEMAKER     St [name]  . LEFT HEART CATH AND CORONARY ANGIOGRAPHY N/A 09/06/2018   Procedure: LEFT HEART CATH AND CORONARY ANGIOGRAPHY;  Surgeon: Tonny Bollman, MD;  Location: [facility] INVASIVE CV LAB;  Service: Cardiovascular;  Laterality: N/A;  . PACEMAKER REVISION N/A 12/01/2011   Procedure: PACEMAKER REVISION;  Surgeon: [name], MD;  Location: [facility] CATH LAB;  Service: Cardiovascular;  Laterality: N/A;  . PPM GENERATOR CHANGEOUT N/A 01/12/2018   Procedure: PPM GENERATOR CHANGEOUT;  Surgeon: [name], MD;  Location: [facility] INVASIVE CV LAB;  Service: Cardiovascular;  Laterality: N/A;    Current Medications: Current Meds  Medication Sig  . clopidogrel (PLAVIX) 75 MG tablet Take 1 tablet (75 mg total) by mouth daily with breakfast.  . Cyanocobalamin (VITAMIN B-12 IJ) Inject 1,000 mg as directed every 30 (thirty) days.   [name] ezetimibe (ZETIA) 10 MG tablet TAKE 1 TABLET BY MOUTH EVERY DAY  . ferrous  sulfate 325 (65 FE) MG EC tablet Take 1 tablet (325 mg total) by mouth 3 (three) times daily with meals.  . furosemide (LASIX) 20 MG tablet Take 20 mg by mouth as needed for edema.  . hydrochlorothiazide (MICROZIDE) 12.5 MG capsule Take 12.5 mg by mouth daily.  . metoprolol tartrate (LOPRESSOR) 50 MG tablet Take 2 tablets (100 mg total) by mouth 2 (two) times daily.  . nitroGLYCERIN (NITROSTAT) 0.4 MG SL tablet Place 1 tablet (0.4 mg total) under the tongue every 5 (five) minutes x 3 doses as needed for chest pain.  [name] omeprazole (PRILOSEC) 40 MG capsule Take 40 mg by mouth daily.  . potassium chloride (KLOR-CON) 10 MEQ tablet Take 10 mEq  by mouth daily.  . Vitamin D, Ergocalciferol, (DRISDOL) 1.25 MG (50000 UT) CAPS capsule Take 50,000 Units by mouth every 7 (seven) days.  Carlena Hurl 15 MG TABS tablet TAKE 1 TABLET BY MOUTH EVERY DAY     Allergies:   Atorvastatin, Crestor [rosuvastatin calcium], Niacin, Sulfa antibiotics, and Penicillins   Social History   Socioeconomic History  . Marital status: Widowed    Spouse name: Not on file  . Number of children: Not on file  . Years of education: Not on file  . Highest education level: Not on file  Occupational History  . Not on file  Tobacco Use  . Smoking status: Never Smoker  . Smokeless tobacco: Never Used  Substance and Sexual Activity  . Alcohol use: No  . Drug use: No  . Sexual activity: Never    Birth control/protection: Post-menopausal  Other Topics Concern  . Not on file  Social History Narrative  . Not on file   Social Determinants of Health   Financial Resource Strain:   . Difficulty of Paying Living Expenses: Not on file  Food Insecurity:   . Worried About Programme researcher, broadcasting/film/video in the Last Year: Not on file  . Ran Out of Food in the Last Year: Not on file  Transportation Needs:   . Lack of Transportation (Medical): Not on file  . Lack of Transportation (Non-Medical): Not on file  Physical Activity:   . Days of Exercise per Week: Not on file  . Minutes of Exercise per Session: Not on file  Stress:   . Feeling of Stress : Not on file  Social Connections:   . Frequency of Communication with Friends and Family: Not on file  . Frequency of Social Gatherings with Friends and Family: Not on file  . Attends Religious Services: Not on file  . Active Member of Clubs or Organizations: Not on file  . Attends Banker Meetings: Not on file  . Marital Status: Not on file     Family History: The patient's family history includes Heart disease in her father and mother. ROS:   Please see the history of present illness.    All 14 point review of  systems negative except as described per history of present illness  EKGs/Labs/Other Studies Reviewed:      Recent Labs: 10/19/2018: BUN 21; Creatinine, Ser 1.37; Potassium 3.6; Sodium 141  Recent Lipid Panel    Component Value Date/Time   CHOL 193 09/05/2018 0048   TRIG 187 (H) 09/05/2018 0048   HDL 31 (L) 09/05/2018 0048   CHOLHDL 6.2 09/05/2018 0048   VLDL 37 09/05/2018 0048   LDLCALC 125 (H) 09/05/2018 0048    Physical Exam:    VS:  BP 130/82   Pulse 73  Ht 5\' 6"  (1.676 m)   Wt 195 lb 9.6 oz (88.7 kg)   SpO2 99%   BMI 31.57 kg/m     Wt Readings from Last 3 Encounters:  09/19/19 195 lb 9.6 oz (88.7 kg)  05/13/19 196 lb (88.9 kg)  11/28/18 195 lb (88.5 kg)     GEN:  Well nourished, well developed in no acute distress HEENT: Normal NECK: No JVD; No carotid bruits LYMPHATICS: No lymphadenopathy CARDIAC: RRR, no murmurs, no rubs, no gallops RESPIRATORY:  Clear to auscultation without rales, wheezing or rhonchi  ABDOMEN: Soft, non-tender, non-distended MUSCULOSKELETAL:  No edema; No deformity  SKIN: Warm and dry LOWER EXTREMITIES: no swelling NEUROLOGIC:  Alert and oriented x 3 PSYCHIATRIC:  Normal affect   ASSESSMENT:    1. Coronary artery disease due to lipid rich plaque   2. Paroxysmal atrial fibrillation (HCC)   3. Statin intolerance   4. Essential hypertension    PLAN:    In order of problems listed above:  1. Coronary disease status post PTCA and stenting of LAD in September 04, 2018.  It is already more than year after stenting she is still on Plavix as well as Xarelto.  I decided to continue Plavix until we have issue with her hyperlipidemia clarified.  We will do fasting lipid profile today if is unacceptably high will start PCSK9 agent and at that time we will stop her Plavix. 2. Paroxysmal atrial fibrillation 4.8% of burden based on pacemaker implant interrogation, however there was some artifactual mode switch.  Continue anticoagulation.  She is  asymptomatic. 3. Statin intolerance will most likely start PCSK9 agent we will check a fasting lipid profile today. 4. Essential hypertension blood pressure well controlled continue present management. 5. Fatigue and tiredness.  I will get her echocardiogram to reassess her left ventricular ejection fraction.   Medication Adjustments/Labs and Tests Ordered: Current medicines are reviewed at length with the patient today.  Concerns regarding medicines are outlined above.  No orders of the defined types were placed in this encounter.  Medication changes: No orders of the defined types were placed in this encounter.   Signed, Park Liter, MD, [facility] 09/19/2019 11:21 AM    [place]

## 2019-09-27 ENCOUNTER — Other Ambulatory Visit: Payer: Self-pay | Admitting: *Deleted

## 2019-09-27 DIAGNOSIS — E785 Hyperlipidemia, unspecified: Secondary | ICD-10-CM

## 2019-10-21 ENCOUNTER — Telehealth (INDEPENDENT_AMBULATORY_CARE_PROVIDER_SITE_OTHER): Payer: Medicare Other | Admitting: Pharmacist

## 2019-10-21 ENCOUNTER — Other Ambulatory Visit: Payer: Self-pay

## 2019-10-21 DIAGNOSIS — E785 Hyperlipidemia, unspecified: Secondary | ICD-10-CM

## 2019-10-21 MED ORDER — ROSUVASTATIN CALCIUM 5 MG PO TABS: 5.0000 mg | ORAL_TABLET | ORAL | 4 refills | Status: DC

## 2019-10-21 NOTE — Progress Notes (Signed)
Patient ID: Judith Flores                 DOB: 1934-05-16                    MRN: [id number]     HPI: Judith Flores is a pleasant 86 y.o. female patient referred to lipid clinic by Dr. Bing Matter for consideration of PCSK9i agent. Today is the first time patient has been in lipid clinic. PMH is significant for CAD, NSTEMI s/p PCI to LAD, HTN, paroxysmal atrial fibrillation, and [employer] [name] dual-chamber pacemaker device.  Patient with multiple statin intolerances in the past. Patient is currently on ezetimibe but has expressed concerns in the past (11/28/18) that it may be causing leg swelling (lower extremity US was negative for DVT).  Today's visit was done via telephone call.   Judith Flores reports continued adverse effects with Zetia including weakness, lower extremity swelling, and arm pain. She explains that the adverse effects with Zetia are even worse on her quality of life than the muscle pain experienced on Crestor in the past. Of note, patient does state that she has been able to tolerate gemfibrozil well in the past but was taking off of it after her NSTEMI.  Current Medications: ezetimibe 10 mg daily Intolerances: rosuvastatin (5 mg daily at bedtime) and atorvastatin (dose unknown), Zetia (weakness, arms hurt) Risk Factors: Prior ASCVD event, HTN, atrial fibrillation, CKD III LDL goal: <70  Diet: oatmeal, red meat 1-2 times a week, fruit, veggies, salads   Family History: Mother and father both with had heart disease  Social History: Never smoked  Labs: lipid panel 09/19/19: LDL 110, HDL 41, total cholesterol 443, Triglycerides 182 (ezetimbie)  Past Medical History:  Diagnosis Date  . Anemia   . CAD (coronary artery disease)    a. 08/2018 NSTEMI/PCI: LM nl, LAD 95p/m (3.5 x 12 mm resolute Onyx DES and 2.5 x 38 mm resolute Onyx DES successfully placed), D1 100, D2 40, RI 95 (2.25 x 38 mm resolute Onyx DES), LCx 30p, RCA 25m 40d.  . CKD (chronic kidney disease), stage III  09/04/2018  . Complete heart block (HCC)    intermittent  . Epigastric pain 09/04/2018  . GERD (gastroesophageal reflux disease)   . HOH (hard of hearing)   . HTN (hypertension)   . Ischemic cardiomyopathy    a. 08/2018 Echo: Ef 50-55%, mild apical anteroseptal and apical HK. Gr1 DD. Triv TR. PaSP .  [name] Pacemaker   . PAF (paroxysmal atrial fibrillation) (HCC)   . Peripheral edema   . Recurrent upper respiratory infection (URI)    HX OF CHRONIC BRONCHITIS  . Syncope     Current Outpatient Medications on File Prior to Visit  Medication Sig Dispense Refill  . clopidogrel (PLAVIX) 75 MG tablet Take 1 tablet (75 mg total) by mouth daily with breakfast. 30 tablet 6  . Cyanocobalamin (VITAMIN B-12 IJ) Inject 1,000 mg as directed every 30 (thirty) days.     [name] ezetimibe (ZETIA) 10 MG tablet TAKE 1 TABLET BY MOUTH EVERY DAY 90 tablet 0  . ferrous sulfate 325 (65 FE) MG EC tablet Take 1 tablet (325 mg total) by mouth 3 (three) times daily with meals. 30 tablet 11  . furosemide (LASIX) 20 MG tablet Take 20 mg by mouth as needed for edema.    . hydrochlorothiazide (MICROZIDE) 12.5 MG capsule Take 12.5 mg by mouth daily.    . metoprolol tartrate (LOPRESSOR) 50 MG tablet Take 2  tablets (100 mg total) by mouth 2 (two) times daily. 360 tablet 3  . nitroGLYCERIN (NITROSTAT) 0.4 MG SL tablet Place 1 tablet (0.4 mg total) under the tongue every 5 (five) minutes x 3 doses as needed for chest pain. 25 tablet 3  . omeprazole (PRILOSEC) 40 MG capsule Take 40 mg by mouth daily.    . potassium chloride (KLOR-CON) 10 MEQ tablet Take 10 mEq by mouth daily.    . Vitamin D, Ergocalciferol, (DRISDOL) 1.25 MG (50000 UT) CAPS capsule Take 50,000 Units by mouth every 7 (seven) days.    Alveda Reasons 15 MG TABS tablet TAKE 1 TABLET BY MOUTH EVERY DAY 30 tablet 5   No current facility-administered medications on file prior to visit.    Allergies  Allergen Reactions  . Atorvastatin Shortness Of Breath    MUSCLE  PAIN  . Crestor [Rosuvastatin Calcium]     Muscle pain  . Niacin     Unknown reaction   . Sulfa Antibiotics Nausea And Vomiting  . Penicillins Hives and Rash    Has patient had a PCN reaction causing immediate rash, facial/tongue/throat swelling, SOB or lightheadedness with hypotension: Yes Has patient had a PCN reaction causing severe rash involving mucus membranes or skin necrosis: Yes Has patient had a PCN reaction that required hospitalization: No Has patient had a PCN reaction occurring within the last 10 years: No If all of the above answers are "NO", then may proceed with Cephalosporin use.     Assessment/Plan:  1. Hyperlipidemia - Judith Flores's LDL remains elevated at 110 and above goal of <70 mg/dL. She has experienced two statin intolerances in the past including atorvastatin and rosuvastatin (5 mg daily). She is currently reporting multiple adverse effects from Zetia including pain, swelling, and weakness that makes her feel worse than the stand-alone muscle pain from rosuvastatin. Discussed with patient the pros and cons of 3 different options: taking low dose of rosuvastatin three times a week, trying a low dose of pravastatin, or initiating a PCSK9i. Judith Flores stated preference to re-try rosuvastatin in hopes that her body will adjust to the medicine. We discussed that gemfibrozil is not preferred due to weaker CVD outcomes data compared to statins.   - Patient agreeable to start rosuvastatin at 5 mg three times a week.  - Discontinue Zetia due to adverse effects (listed above).  - Follow-up with patient in ~ 4 weeks to assess tolerability to rosuvastatin  - Recheck fasting lipid panel in 2 months. Conduct lab draw in [place] since this location is closer to patient.  - Patient reports eating a heart healthy diet. Encouraged to continue.  Sherren Kerns, PharmD PGY1 Acute Care Pharmacy Resident  Ramond Dial, Pharm.D, BCPS, CPP [place]  [id number] [place], [place] [address]  Phone: [phone]; Fax: [phone]

## 2019-11-05 ENCOUNTER — Telehealth: Payer: Self-pay | Admitting: Cardiology

## 2019-11-05 NOTE — Telephone Encounter (Signed)
New Message    Pt is calling and is wondering if her sister in law can assist her to her appt on Friday because she is hard of hearing. She also said she is nervous and would feel more comfortable to have her there     Please call

## 2019-11-05 NOTE — Telephone Encounter (Signed)
Pt scheduled for echo and instructed pt should be fine as this is a test Pt verbalizes understanding ./cy

## 2019-11-08 ENCOUNTER — Ambulatory Visit (INDEPENDENT_AMBULATORY_CARE_PROVIDER_SITE_OTHER): Payer: Medicare Other

## 2019-11-08 ENCOUNTER — Other Ambulatory Visit: Payer: Self-pay

## 2019-11-08 DIAGNOSIS — I480 Paroxysmal atrial fibrillation: Secondary | ICD-10-CM | POA: Diagnosis not present

## 2019-11-08 NOTE — Progress Notes (Signed)
Complete echocardiogram has been performed.  Jimmy Merdith Adan RDCS, RVT 

## 2019-12-17 ENCOUNTER — Ambulatory Visit (INDEPENDENT_AMBULATORY_CARE_PROVIDER_SITE_OTHER): Payer: Medicare Other | Admitting: *Deleted

## 2019-12-17 ENCOUNTER — Telehealth: Payer: Self-pay | Admitting: Pharmacist

## 2019-12-17 DIAGNOSIS — I480 Paroxysmal atrial fibrillation: Secondary | ICD-10-CM | POA: Diagnosis not present

## 2019-12-17 LAB — CUP PACEART REMOTE DEVICE CHECK
Battery Remaining Longevity: 112 mo
Battery Remaining Percentage: 95.5 %
Battery Voltage: 2.99 V
Brady Statistic AP VP Percent: 85 %
Brady Statistic AP VS Percent: 1 %
Brady Statistic AS VP Percent: 14 %
Brady Statistic AS VS Percent: 1 %
Brady Statistic RA Percent Paced: 82 %
Brady Statistic RV Percent Paced: 99 %
Date Time Interrogation Session: 20210406035151
Implantable Lead Implant Date: 20130321
Implantable Lead Implant Date: 20130321
Implantable Lead Location: 753859
Implantable Lead Location: 753860
Implantable Pulse Generator Implant Date: 20190503
Lead Channel Impedance Value: 360 Ohm
Lead Channel Impedance Value: 630 Ohm
Lead Channel Pacing Threshold Amplitude: 0.5 V
Lead Channel Pacing Threshold Amplitude: 0.625 V
Lead Channel Pacing Threshold Pulse Width: 0.4 ms
Lead Channel Pacing Threshold Pulse Width: 0.4 ms
Lead Channel Sensing Intrinsic Amplitude: 1.6 mV
Lead Channel Sensing Intrinsic Amplitude: 12 mV
Lead Channel Setting Pacing Amplitude: 0.75 V
Lead Channel Setting Pacing Amplitude: 2 V
Lead Channel Setting Pacing Pulse Width: 0.4 ms
Lead Channel Setting Sensing Sensitivity: 6 mV
Pulse Gen Model: 2272
Pulse Gen Serial Number: 9019281

## 2019-12-17 NOTE — Telephone Encounter (Signed)
Called patient to see how she was doing on rosuvastatin 5mg  daily. States she is doing well, no issues. She is seeing her PCP on 4/22 and wants to know if she can get her labs done there. I have called Dr. 5/22 office and left a messaging asking if he would be willing to order the lab.

## 2019-12-18 NOTE — Progress Notes (Signed)
PPM Remote  

## 2019-12-19 ENCOUNTER — Telehealth: Payer: Self-pay | Admitting: *Deleted

## 2019-12-19 NOTE — Telephone Encounter (Signed)
Informed patient of results and verbal understanding expressed. Pt already takes Lopressor 100 mg in the morning and 50 mg at night. Will forward to Dr. Elberta Fortis for advisement being that she is already taking Metoprolol Pt aware I will call her back next week, once Dr. Elberta Fortis can review

## 2019-12-19 NOTE — Telephone Encounter (Signed)
Dr. Earlene Plater office called back. Office staff states that Dr. Earlene Plater is out for medical reasons and she will be seeing another provider. She is not sure they will sign off on the lipid panel, but she hasn't had a lipid panel since october, she thinks they will probably order one. She will also out note in her chart. I will follow up after 4/22 to see if it was drawn.

## 2019-12-19 NOTE — Telephone Encounter (Signed)
-----   Message from Will Jorja Loa, MD sent at 12/17/2019  1:50 PM EDT ----- Abnormal device interrogation reviewed.  Lead parameters and battery status stable.  AF noted. On xaretlo. Has rapid rates. Start metoprolol 25 mg BID.

## 2019-12-20 NOTE — Telephone Encounter (Signed)
Increase to 100 mg BID

## 2019-12-23 NOTE — Telephone Encounter (Signed)
Pt advised. She reports she tried taking 100 mg BID and her BP dropped to low, and she cannot take that higher dosing. Pt aware I will get advisement and let her know.

## 2019-12-25 MED ORDER — DILTIAZEM HCL ER COATED BEADS 120 MG PO CP24: 120.0000 mg | ORAL_CAPSULE | Freq: Every day | ORAL | 3 refills | Status: DC

## 2019-12-25 NOTE — Telephone Encounter (Signed)
Pt advised to take Lopressor as she is currently. Advised to start Diltiazem 120 mg once daily. advised to call office if SE begin after new med start. Patient verbalized understanding and agreeable to plan.

## 2020-01-03 ENCOUNTER — Telehealth: Payer: Self-pay | Admitting: Pharmacist

## 2020-01-03 DIAGNOSIS — E785 Hyperlipidemia, unspecified: Secondary | ICD-10-CM

## 2020-01-03 MED ORDER — ROSUVASTATIN CALCIUM 5 MG PO TABS: 5.0000 mg | ORAL_TABLET | Freq: Every day | ORAL | 3 refills | Status: DC

## 2020-01-03 NOTE — Telephone Encounter (Signed)
Called and discussed labs from PCP office. LDL is improved to 93. But still above goal of <70. Patient states she feel suprisingly good on rousvastatin 5mg  three times a week and wants to try taking it daily. New rx sent. Will repeat lipid panel when she sees Dr. in June.

## 2020-02-24 ENCOUNTER — Other Ambulatory Visit: Payer: Self-pay

## 2020-03-02 ENCOUNTER — Ambulatory Visit: Payer: Medicare Other | Admitting: Cardiology

## 2020-03-02 ENCOUNTER — Encounter: Payer: Self-pay | Admitting: Cardiology

## 2020-03-02 ENCOUNTER — Other Ambulatory Visit: Payer: Self-pay

## 2020-03-02 VITALS — BP 128/80 | HR 80 | Ht 66.0 in | Wt 199.4 lb

## 2020-03-02 DIAGNOSIS — I2510 Atherosclerotic heart disease of native coronary artery without angina pectoris: Secondary | ICD-10-CM

## 2020-03-02 DIAGNOSIS — Z950 Presence of cardiac pacemaker: Secondary | ICD-10-CM

## 2020-03-02 DIAGNOSIS — E782 Mixed hyperlipidemia: Secondary | ICD-10-CM

## 2020-03-02 DIAGNOSIS — I2583 Coronary atherosclerosis due to lipid rich plaque: Secondary | ICD-10-CM

## 2020-03-02 DIAGNOSIS — I10 Essential (primary) hypertension: Secondary | ICD-10-CM | POA: Diagnosis not present

## 2020-03-02 NOTE — Progress Notes (Signed)
Cardiology Office Note:    Date:  03/02/2020   ID:  Gabriel Paulding, DOB March 24, 1934, MRN [id number]  PCP:  Coy Saunas, MD  Cardiologist:  Jenne Campus, MD    Referring MD: Coy Saunas, MD   Chief Complaint  Patient presents with  . Follow-up    5 MO FU   Doing better but still weak and tired  History of Present Illness:    Judith Flores is a 86 y.o. female with past medical history significant for coronary artery disease in 2019 she required stenting to mid LAD with drug-eluting stent, at that time diagonal 1 branch was completely occluded.  Also history of dyslipidemia with difficult time tolerating statin.  She has been complaining of weakness fatigue and tiredness for long period of time.  We did echocardiogram showed preserved left ventricle ejection fraction.  Overall she was able to tolerate 5 mg Crestor every other day however 1 dose has been increased to 5 mg every day she still having weakness and fatigue and she thinks this is what caused the trouble.  I told her to stop taking Crestor every day just take it every other day and let me know how she feels.  If she improved she may be candidate for PCSK9 agent.  I also offer her potentially doing stress test to make sure that this is not related to her heart however she declined.  She thinks is related to medication when I play with her medications first  Past Medical History:  Diagnosis Date  . Anemia   . CAD (coronary artery disease)    a. 08/2018 NSTEMI/PCI: LM nl, LAD 95p/m (3.5 x 12 mm resolute Onyx DES and 2.5 x 38 mm resolute Onyx DES successfully placed), D1 100, D2 40, RI 95 (2.25 x 38 mm resolute Onyx DES), LCx 30p, RCA 58m 40d.  . CKD (chronic kidney disease), stage III 09/04/2018  . Complete heart block (HCC)    intermittent  . Epigastric pain 09/04/2018  . GERD (gastroesophageal reflux disease)   . HOH (hard of hearing)   . HTN (hypertension)   . Ischemic cardiomyopathy    a. 08/2018 Echo: Ef 50-55%,  mild apical anteroseptal and apical HK. Gr1 DD. Triv TR. PaSP 52mmHg.  [name] Pacemaker   . PAF (paroxysmal atrial fibrillation) ([place])   . Peripheral edema   . Recurrent upper respiratory infection (URI)    HX OF CHRONIC BRONCHITIS  . Syncope     Past Surgical History:  Procedure Laterality Date  . breast tumor    . CORONARY STENT INTERVENTION N/A 09/07/2018   Procedure: CORONARY STENT INTERVENTION;  Surgeon: Sherren Mocha, MD;  Location: [place] [facility];  Service: Cardiovascular;  Laterality: N/A;  . INSERT / REPLACE / REMOVE PACEMAKER     [employer] [name]  . LEFT HEART CATH AND CORONARY ANGIOGRAPHY N/A 09/06/2018   Procedure: LEFT HEART CATH AND CORONARY ANGIOGRAPHY;  Surgeon: Sherren Mocha, MD;  Location: [place] [facility];  Service: Cardiovascular;  Laterality: N/A;  . PACEMAKER REVISION N/A 12/01/2011   Procedure: PACEMAKER REVISION;  Surgeon: Deboraha Sprang, MD;  Location: [facility];  Service: Cardiovascular;  Laterality: N/A;  . PPM GENERATOR CHANGEOUT N/A 01/12/2018   Procedure: PPM GENERATOR CHANGEOUT;  Surgeon: Deboraha Sprang, MD;  Location: [place] [facility];  Service: Cardiovascular;  Laterality: N/A;    Current Medications: Current Meds  Medication Sig  . clopidogrel (PLAVIX) 75 MG tablet Take 1 tablet (75 mg total) by  mouth daily with breakfast.  . Coenzyme Q10 (COQ10) 100 MG CAPS Take 1 capsule by mouth daily.  . Cyanocobalamin (VITAMIN B-12 IJ) Inject 1,000 mg as directed every 30 (thirty) days.   [name] diltiazem (CARDIZEM CD) 120 MG 24 hr capsule Take 1 capsule (120 mg total) by mouth daily.  [name] diltiazem (TIAZAC) 120 MG 24 hr capsule Take 1 capsule by mouth daily.  . ferrous sulfate 325 (65 FE) MG EC tablet Take 1 tablet (325 mg total) by mouth 3 (three) times daily with meals.  . furosemide (LASIX) 20 MG tablet Take 20 mg by mouth as needed for edema.  . hydrochlorothiazide (MICROZIDE) 12.5 MG capsule Take 12.5 mg by mouth daily.  . metoprolol tartrate (LOPRESSOR) 50 MG  tablet Take 2 tablets (100 mg total) by mouth 2 (two) times daily.  . nitroGLYCERIN (NITROSTAT) 0.4 MG SL tablet Place 1 tablet (0.4 mg total) under the tongue every 5 (five) minutes x 3 doses as needed for chest pain.  [name] omeprazole (PRILOSEC) 20 MG capsule Take 20 mg by mouth daily.   . potassium chloride (KLOR-CON) 10 MEQ tablet Take 10 mEq by mouth daily.  . rosuvastatin (CRESTOR) 5 MG tablet Take 1 tablet (5 mg total) by mouth daily.  . Syringe/Needle, Disp, (SYRINGE 3CC/25GX1") 25G X 1" 3 ML MISC Use to inject B12 once monthly  . Vitamin D, Ergocalciferol, (DRISDOL) 1.25 MG (50000 UT) CAPS capsule Take 50,000 Units by mouth every 7 (seven) days.  Carlena Hurl 15 MG TABS tablet TAKE 1 TABLET BY MOUTH EVERY DAY     Allergies:   Atorvastatin, Crestor [rosuvastatin calcium], Niacin, Sulfa antibiotics, and Penicillins   Social History   Socioeconomic History  . Marital status: Widowed    Spouse name: Not on file  . Number of children: Not on file  . Years of education: Not on file  . Highest education level: Not on file  Occupational History  . Not on file  Tobacco Use  . Smoking status: Never Smoker  . Smokeless tobacco: Never Used  Vaping Use  . Vaping Use: Never used  Substance and Sexual Activity  . Alcohol use: No  . Drug use: No  . Sexual activity: Never    Birth control/protection: Post-menopausal  Other Topics Concern  . Not on file  Social History Narrative  . Not on file   Social Determinants of Health   Financial Resource Strain:   . Difficulty of Paying Living Expenses:   Food Insecurity:   . Worried About Programme researcher, broadcasting/film/video in the Last Year:   . Barista in the Last Year:   Transportation Needs:   . Freight forwarder (Medical):   [name] Lack of Transportation (Non-Medical):   Physical Activity:   . Days of Exercise per Week:   . Minutes of Exercise per Session:   Stress:   . Feeling of Stress :   Social Connections:   . Frequency of Communication  with Friends and Family:   . Frequency of Social Gatherings with Friends and Family:   . Attends Religious Services:   . Active Member of Clubs or Organizations:   . Attends Banker Meetings:   [name] Marital Status:      Family History: The patient's family history includes Heart disease in her father and mother. ROS:   Please see the history of present illness.    All 14 point review of systems negative except as described per history of present  illness  EKGs/Labs/Other Studies Reviewed:      Recent Labs: No results found for requested labs within last 8760 hours.  Recent Lipid Panel    Component Value Date/Time   CHOL 182 09/19/2019 0000   TRIG 174 (H) 09/19/2019 0000   HDL 41 09/19/2019 0000   CHOLHDL 4.4 09/19/2019 0000   CHOLHDL 6.2 09/05/2018 0048   VLDL 37 09/05/2018 0048   LDLCALC 110 (H) 09/19/2019 0000    Physical Exam:    VS:  BP 128/80 (BP Location: Left Arm, Patient Position: Sitting, Cuff Size: Normal)   Pulse 80   Ht 5\' 6"  (1.676 m)   Wt 199 lb 6.4 oz (90.4 kg)   SpO2 96%   BMI 32.18 kg/m     Wt Readings from Last 3 Encounters:  03/02/20 199 lb 6.4 oz (90.4 kg)  09/19/19 195 lb 9.6 oz (88.7 kg)  05/13/19 196 lb (88.9 kg)     GEN:  Well nourished, well developed in no acute distress HEENT: Normal NECK: No JVD; No carotid bruits LYMPHATICS: No lymphadenopathy CARDIAC: RRR, no murmurs, no rubs, no gallops RESPIRATORY:  Clear to auscultation without rales, wheezing or rhonchi  ABDOMEN: Soft, non-tender, non-distended MUSCULOSKELETAL:  No edema; No deformity  SKIN: Warm and dry LOWER EXTREMITIES: no swelling NEUROLOGIC:  Alert and oriented x 3 PSYCHIATRIC:  Normal affect   ASSESSMENT:    1. Coronary artery disease due to lipid rich plaque   2. Mixed hyperlipidemia   3. Essential hypertension    PLAN:    In order of problems listed above:  1. Coronary disease plan as outlined above.  I offered a stress test she declined she does  not have any typical symptoms except fatigue and tiredness.  We will continue monitoring treatment 2. Dyslipidemia: Plan as outlined above.  We will cut down the Crestor to every other day to see if she feels any better if that is the case PCSK9 will be initiated. 3. Essential hypertension blood pressure well controlled continue present management.   Medication Adjustments/Labs and Tests Ordered: Current medicines are reviewed at length with the patient today.  Concerns regarding medicines are outlined above.  No orders of the defined types were placed in this encounter.  Medication changes: No orders of the defined types were placed in this encounter.   Signed, 05/15/19, MD, [facility] 03/02/2020 12:12 PM    [facility]

## 2020-03-02 NOTE — Patient Instructions (Signed)

## 2020-03-11 ENCOUNTER — Other Ambulatory Visit: Payer: Self-pay | Admitting: Cardiology

## 2020-03-11 NOTE — Telephone Encounter (Signed)
Prescription refill request for Xarelto received.  Indication: Atrial Fibrillation Last office visit: 03/02/2020 Dr Bing Matter Weight: 90.4 kg Age: 86 Scr: 1.4 01/02/20  CrCl: 42.7 mg/min

## 2020-03-11 NOTE — Telephone Encounter (Signed)
LOV with Dr. Bing Matter was 03/02/2020

## 2020-03-17 ENCOUNTER — Ambulatory Visit (INDEPENDENT_AMBULATORY_CARE_PROVIDER_SITE_OTHER): Payer: Medicare Other | Admitting: *Deleted

## 2020-03-17 DIAGNOSIS — R55 Syncope and collapse: Secondary | ICD-10-CM | POA: Diagnosis not present

## 2020-03-17 LAB — CUP PACEART REMOTE DEVICE CHECK
Battery Remaining Longevity: 112 mo
Battery Remaining Percentage: 95.5 %
Battery Voltage: 2.99 V
Brady Statistic AP VP Percent: 85 %
Brady Statistic AP VS Percent: 1 %
Brady Statistic AS VP Percent: 15 %
Brady Statistic AS VS Percent: 1 %
Brady Statistic RA Percent Paced: 81 %
Brady Statistic RV Percent Paced: 99 %
Date Time Interrogation Session: 20210706043004
Implantable Lead Implant Date: 20130321
Implantable Lead Implant Date: 20130321
Implantable Lead Location: 753859
Implantable Lead Location: 753860
Implantable Pulse Generator Implant Date: 20190503
Lead Channel Impedance Value: 360 Ohm
Lead Channel Impedance Value: 630 Ohm
Lead Channel Pacing Threshold Amplitude: 0.5 V
Lead Channel Pacing Threshold Amplitude: 0.625 V
Lead Channel Pacing Threshold Pulse Width: 0.4 ms
Lead Channel Pacing Threshold Pulse Width: 0.4 ms
Lead Channel Sensing Intrinsic Amplitude: 1.9 mV
Lead Channel Sensing Intrinsic Amplitude: 12 mV
Lead Channel Setting Pacing Amplitude: 0.75 V
Lead Channel Setting Pacing Amplitude: 2 V
Lead Channel Setting Pacing Pulse Width: 0.4 ms
Lead Channel Setting Sensing Sensitivity: 6 mV
Pulse Gen Model: 2272
Pulse Gen Serial Number: 9019281

## 2020-03-18 NOTE — Progress Notes (Signed)
Remote pacemaker transmission.   

## 2020-05-25 ENCOUNTER — Other Ambulatory Visit: Payer: Self-pay

## 2020-05-25 ENCOUNTER — Encounter: Payer: Self-pay | Admitting: Cardiology

## 2020-05-25 ENCOUNTER — Ambulatory Visit (INDEPENDENT_AMBULATORY_CARE_PROVIDER_SITE_OTHER): Payer: Medicare Other | Admitting: Cardiology

## 2020-05-25 VITALS — BP 112/58 | HR 70 | Ht 67.0 in | Wt 196.0 lb

## 2020-05-25 DIAGNOSIS — I442 Atrioventricular block, complete: Secondary | ICD-10-CM | POA: Diagnosis not present

## 2020-05-25 MED ORDER — METOPROLOL SUCCINATE ER 100 MG PO TB24
150.0000 mg | ORAL_TABLET | Freq: Every day | ORAL | 11 refills | Status: DC
Start: 2020-05-25 — End: 2021-06-23

## 2020-05-25 NOTE — Patient Instructions (Addendum)
Medication Instructions:  Your physician has recommended you make the following change in your medication:  1. STOP Metoprolol Tartrate (Lopressor) 2. START Metoprolol Succinate (Toprol) 150 mg once daily at bedtime  *If you need a refill on your cardiac medications before your next appointment, please call your pharmacy*   Lab Work: None ordered   Testing/Procedures: None ordered   Follow-Up: Remote monitoring is used to monitor your Pacemaker of ICD from home. This monitoring reduces the number of office visits required to check your device to one time per year. It allows [place] to keep an eye on the functioning of your device to ensure it is working properly. You are scheduled for a device check from home on 06/16/2020. You may send your transmission at any time that day. If you have a wireless device, the transmission will be sent automatically. After your physician reviews your transmission, you will receive a postcard with your next transmission date.  At [facility], you and your health needs are our priority.  As part of our continuing mission to provide you with exceptional heart care, we have created designated Provider Care Teams.  These Care Teams include your primary Cardiologist (physician) and Advanced Practice Providers (APPs -  Physician Assistants and Nurse Practitioners) who all work together to provide you with the care you need, when you need it.  We recommend signing up for the patient portal called "MyChart".  Sign up information is provided on this After Visit Summary.  MyChart is used to connect with patients for Virtual Visits (Telemedicine).  Patients are able to view lab/test results, encounter notes, upcoming appointments, etc.  Non-urgent messages can be sent to your provider as well.   To learn more about what you can do with MyChart, go to [email or web address].    Your next appointment:   1 year(s)  The format for your next appointment:   In  Person  Provider:   Loman Brooklyn, MD   Thank you for choosing [facility]!!   Dory Horn, RN [phone]    Other Instructions  Metoprolol Extended-Release Tablets What is this medicine? METOPROLOL (me TOE proe lole) is a beta blocker. It decreases the amount of work your heart has to do and helps your heart beat regularly. It treats high blood pressure and/or prevent chest pain (also called angina). It also treats heart failure. This medicine may be used for other purposes; ask your health care provider or pharmacist if you have questions. COMMON BRAND NAME(S): toprol, Toprol XL What should I tell my health care provider before I take this medicine? They need to know if you have any of these conditions:  diabetes  heart or vessel disease like slow heart rate, worsening heart failure, heart block, sick sinus syndrome or Raynaud's disease  kidney disease  liver disease  lung or breathing disease, like asthma or emphysema  pheochromocytoma  thyroid disease  an unusual or allergic reaction to metoprolol, other beta-blockers, medicines, foods, dyes, or preservatives  pregnant or trying to get pregnant  breast-feeding How should I use this medicine? Take this drug by mouth. Take it as directed on the prescription label at the same time every day. Take it with food. You may cut the tablet in half if it is scored (has a line in the middle of it). This may help you swallow the tablet if the whole tablet is too big. Be sure to take both halves. Do not take just one-half of the tablet. Keep taking it unless  your health care provider tells you to stop. Talk to your health care provider about the use of this drug in children. While it may be prescribed for children as young as 6 for selected conditions, precautions do apply. Overdosage: If you think you have taken too much of this medicine contact a poison control center or emergency room at once. NOTE: This medicine is  only for you. Do not share this medicine with others. What if I miss a dose? If you miss a dose, take it as soon as you can. If it is almost time for your next dose, take only that dose. Do not take double or extra doses. What may interact with this medicine? This medicine may interact with the following medications:  certain medicines for blood pressure, heart disease, irregular heart beat  certain medicines for depression, like monoamine oxidase (MAO) inhibitors, fluoxetine, or paroxetine  clonidine  dobutamine  epinephrine  isoproterenol  reserpine This list may not describe all possible interactions. Give your health care provider a list of all the medicines, herbs, non-prescription drugs, or dietary supplements you use. Also tell them if you smoke, drink alcohol, or use illegal drugs. Some items may interact with your medicine. What should I watch for while using this medicine? Visit your doctor or health care professional for regular check ups. Contact your doctor right away if your symptoms worsen. Check your blood pressure and pulse rate regularly. Ask your health care professional what your blood pressure and pulse rate should be, and when you should contact them. You may get drowsy or dizzy. Do not drive, use machinery, or do anything that needs mental alertness until you know how this medicine affects you. Do not sit or stand up quickly, especially if you are an older patient. This reduces the risk of dizzy or fainting spells. Contact your doctor if these symptoms continue. Alcohol may interfere with the effect of this medicine. Avoid alcoholic drinks. This medicine may increase blood sugar. Ask your healthcare provider if changes in diet or medicines are needed if you have diabetes. What side effects may I notice from receiving this medicine? Side effects that you should report to your doctor or health care professional as soon as possible:  allergic reactions like skin rash,  itching or hives  cold or numb hands or feet  depression  difficulty breathing  faint  fever with sore throat  irregular heartbeat, chest pain  rapid weight gain   signs and symptoms of high blood sugar such as being more thirsty or hungry or having to urinate more than normal. You may also feel very tired or have blurry vision.  swollen legs or ankles Side effects that usually do not require medical attention (report to your doctor or health care professional if they continue or are bothersome):  anxiety or nervousness  change in sex drive or performance  dry skin  headache  nightmares or trouble sleeping  short term memory loss  stomach upset or diarrhea This list may not describe all possible side effects. Call your doctor for medical advice about side effects. You may report side effects to FDA at 1-800-FDA-1088. Where should I keep my medicine? Keep out of the reach of children and pets. Store at room temperature between 20 and 25 degrees C (68 and 77 degrees F). Throw away any unused drug after the expiration date. NOTE: This sheet is a summary. It may not cover all possible information. If you have questions about this medicine, talk to  your doctor, pharmacist, or health care provider.  2020 Elsevier/Gold Standard (2019-04-11 18:23:00)

## 2020-05-25 NOTE — Progress Notes (Signed)
Electrophysiology Office Note   Date:  05/25/2020   ID:  Judith Flores, Judith Flores [birth year], MRN [id number]  PCP:  Judith Giovanni, MD  Cardiologist:  Judith Flores Primary Electrophysiologist:  Judith Flores Judith Loa, MD    No chief complaint on file.    History of Present Illness: Judith Flores is a 86 y.o. female who is being seen today for the evaluation of pacemaker at the request of Judith Richmond, MD. Presenting today for electrophysiology evaluation.  She has a history of coronary artery disease status post LAD stent December 2019 after non-STEMI, hypertension, hyperlipidemia, and complete heart block status post [facility] [name] dual-chamber pacemaker.  Today, denies symptoms of palpitations, chest pain, shortness of breath, orthopnea, PND, lower extremity edema, claudication, dizziness, presyncope, syncope, bleeding, or neurologic sequela. The patient is tolerating medications without difficulties.  She is overall doing well.  She has no chest pain or shortness of breath.  Her main complaint today is fatigue.  She says that she has to take breaks when doing her daily activities due to her level of fatigue.  Her primary physician has been making medication adjustments but none of this has helped.  She has had fatigue for the last 2 years, since getting her LAD stent.  Past Medical History:  Diagnosis Date  . Anemia   . CAD (coronary artery disease)    a. 08/2018 NSTEMI/PCI: LM nl, LAD 95p/m (3.5 x 12 mm resolute Onyx DES and 2.5 x 38 mm resolute Onyx DES successfully placed), D1 100, D2 40, RI 95 (2.25 x 38 mm resolute Onyx DES), LCx 30p, RCA 26m 40d.  . CKD (chronic kidney disease), stage III 09/04/2018  . Complete heart block (HCC)    intermittent  . Epigastric pain 09/04/2018  . GERD (gastroesophageal reflux disease)   . HOH (hard of hearing)   . HTN (hypertension)   . Ischemic cardiomyopathy    a. 08/2018 Echo: Ef 50-55%, mild apical anteroseptal and apical HK. Gr1 DD. Triv TR. PaSP  .  Judith Flores Pacemaker   . PAF (paroxysmal atrial fibrillation) (HCC)   . Peripheral edema   . Recurrent upper respiratory infection (URI)    HX OF CHRONIC BRONCHITIS  . Syncope    Past Surgical History:  Procedure Laterality Date  . breast tumor    . CORONARY STENT INTERVENTION N/A 09/07/2018   Procedure: CORONARY STENT INTERVENTION;  Surgeon: Tonny Bollman, MD;  Location: [facility] INVASIVE CV LAB;  Service: Cardiovascular;  Laterality: N/A;  . INSERT / REPLACE / REMOVE PACEMAKER     St [name]  . LEFT HEART CATH AND CORONARY ANGIOGRAPHY N/A 09/06/2018   Procedure: LEFT HEART CATH AND CORONARY ANGIOGRAPHY;  Surgeon: Tonny Bollman, MD;  Location: [facility] INVASIVE CV LAB;  Service: Cardiovascular;  Laterality: N/A;  . PACEMAKER REVISION N/A 12/01/2011   Procedure: PACEMAKER REVISION;  Surgeon: [name], MD;  Location: [facility] CATH LAB;  Service: Cardiovascular;  Laterality: N/A;  . PPM GENERATOR CHANGEOUT N/A 01/12/2018   Procedure: PPM GENERATOR CHANGEOUT;  Surgeon: [name], MD;  Location: [facility] INVASIVE CV LAB;  Service: Cardiovascular;  Laterality: N/A;     Current Outpatient Medications  Medication Sig Dispense Refill  . clopidogrel (PLAVIX) 75 MG tablet Take 1 tablet (75 mg total) by mouth daily with breakfast. 30 tablet 6  . Coenzyme Q10 (COQ10) 100 MG CAPS Take 1 capsule by mouth daily.    . Cyanocobalamin (VITAMIN B-12 IJ) Inject 1,000 mg as directed every 30 (thirty) days.     Judith Flores  diltiazem (CARDIZEM CD) 120 MG 24 hr capsule TAKE 1 CAPSULE(120 MG) BY MOUTH DAILY 90 capsule 3  . diltiazem (TIAZAC) 120 MG 24 hr capsule Take 1 capsule by mouth daily.    . ferrous sulfate 325 (65 FE) MG EC tablet Take 1 tablet (325 mg total) by mouth 3 (three) times daily with meals. 30 tablet 11  . furosemide (LASIX) 20 MG tablet Take 20 mg by mouth as needed for edema.    . hydrochlorothiazide (MICROZIDE) 12.5 MG capsule Take 12.5 mg by mouth daily.    . metoprolol tartrate (LOPRESSOR) 50 MG tablet Take  2 tablets (100 mg total) by mouth 2 (two) times daily. 360 tablet 3  . nitroGLYCERIN (NITROSTAT) 0.4 MG SL tablet Place 1 tablet (0.4 mg total) under the tongue every 5 (five) minutes x 3 doses as needed for chest pain. 25 tablet 3  . omeprazole (PRILOSEC) 20 MG capsule Take 20 mg by mouth daily.     . potassium chloride (KLOR-CON) 10 MEQ tablet Take 10 mEq by mouth daily.    . rosuvastatin (CRESTOR) 5 MG tablet Take 1 tablet (5 mg total) by mouth daily. 90 tablet 3  . Syringe/Needle, Disp, (SYRINGE 3CC/25GX1") 25G X 1" 3 ML MISC Use to inject B12 once monthly    . Vitamin D, Ergocalciferol, (DRISDOL) 1.25 MG (50000 UT) CAPS capsule Take 50,000 Units by mouth every 7 (seven) days.    Judith Flores 15 MG TABS tablet TAKE 1 TABLET BY MOUTH EVERY DAY 30 tablet 5   No current facility-administered medications for this visit.    Allergies:   Atorvastatin, Crestor [rosuvastatin calcium], Niacin, Sulfa antibiotics, and Penicillins   Social History:  The patient  reports that she has never smoked. She has never used smokeless tobacco. She reports that she does not drink alcohol and does not use drugs.   Family History:  The patient's family history includes Heart disease in her father and mother.   ROS:  Please see the history of present illness.   Otherwise, review of systems is positive for none.   All other systems are reviewed and negative.   PHYSICAL EXAM: VS:  BP (!) 112/58   Pulse 70   Ht 5\' 7"  (1.702 m)   Wt 196 lb (88.9 kg)   SpO2 97%   BMI 30.70 kg/m  , BMI Body mass index is 30.7 kg/m. GEN: Well nourished, well developed, in no acute distress  HEENT: normal  Neck: no JVD, carotid bruits, or masses Cardiac: RRR; no murmurs, rubs, or gallops,no edema  Respiratory:  clear to auscultation bilaterally, normal work of breathing GI: soft, nontender, nondistended, + BS MS: no deformity or atrophy  Skin: warm and dry, device site well healed Neuro:  Strength and sensation are  intact Psych: euthymic mood, full affect  EKG:  EKG is ordered today. Personal review of the ekg ordered shows AV paced, rate 70  Personal review of the device interrogation today. Results in Paceart    Recent Labs: No results found for requested labs within last 8760 hours.    Lipid Panel     Component Value Date/Time   CHOL 182 09/19/2019 0000   TRIG 174 (H) 09/19/2019 0000   HDL 41 09/19/2019 0000   CHOLHDL 4.4 09/19/2019 0000   CHOLHDL 6.2 09/05/2018 0048   VLDL 37 09/05/2018 0048   LDLCALC 110 (H) 09/19/2019 0000     Wt Readings from Last 3 Encounters:  05/25/20 196 lb (88.9 kg)  03/02/20 199 lb 6.4 oz (90.4 kg)  09/19/19 195 lb 9.6 oz (88.7 kg)      Other studies Reviewed: Additional studies/ records that were reviewed today include: TTE 09/04/18  Review of the above records today demonstrates:  - Left ventricle: The cavity size was normal. Systolic function was   normal. The estimated ejection fraction was in the range of 50%   to 55%. Mild hypokinesis of the apical anteroseptal and apical   myocardium. Doppler parameters are consistent with abnormal left   ventricular relaxation (grade 1 diastolic dysfunction). Doppler   parameters are consistent with indeterminate ventricular filling   pressure. - Aortic valve: Transvalvular velocity was within the normal range.   There was no stenosis. There was no regurgitation. Valve area   (VTI): 2.48 cm^2. Valve area (Vmean): 2.49 cm^2. - Mitral valve: Transvalvular velocity was within the normal range.   There was no evidence for stenosis. There was no regurgitation. - Right ventricle: The cavity size was normal. Wall thickness was   normal. Systolic function was normal. - Atrial septum: No defect or patent foramen ovale was identified   by color flow Doppler. - Tricuspid valve: There was trivial regurgitation. - Pulmonary arteries: Systolic pressure was within the normal   range. PA peak pressure: 13 mm Hg  (S).  LHC 09/07/18  Prox LAD to Mid LAD lesion is 95% stenosed.  Ost 1st Diag lesion is 100% stenosed.  Ost 2nd Diag to 2nd Diag lesion is 40% stenosed.  Prox Cx lesion is 30% stenosed with 30% stenosed side branch in Ost 1st Mrg.  Mid RCA lesion is 70% stenosed.  Dist RCA lesion is 40% stenosed.  Ost Ramus to Ramus lesion is 95% stenosed.  A drug-eluting stent was successfully placed using a STENT RESOLUTE ONYX [id number].  Post intervention, there is a 0% residual stenosis.  A drug-eluting stent was successfully placed using a STENT RESOLUTE ONYX 2.5X38.  Post intervention, there is a 0% residual stenosis.   ASSESSMENT AND PLAN:  1.  Paroxysmal atrial fibrillation: Less than 5% on pacemaker interrogation.  Currently on metoprolol, diltiazem and Xarelto.  CHA2DS2-VASc of 4.  2.  Complete heart block: Status post Saint [name] dual-chamber pacemaker.  Device functioning appropriately.  No changes at this time.    3.  Coronary artery disease: Status post LAD stent.  No current chest pain.  She is on metoprolol with weakness and fatigue.  We Ailine Hefferan switch her to Toprol-XL to take at night.    Current medicines are reviewed at length with the patient today.   The patient does not have concerns regarding her medicines.  The following changes were made today: Stop metoprolol, start Toprol-XL  Labs/ tests ordered today include:  Orders Placed This Encounter  Procedures  . EKG 12-Lead     Disposition:   FU with Aarthi Uyeno 1 year  Signed, Marrisa Kimber Judith Loa, MD  05/25/2020 9:28 AM     [facility] [address] [place] [address] [phone] (office) [phone] (fax)

## 2020-06-16 ENCOUNTER — Ambulatory Visit (INDEPENDENT_AMBULATORY_CARE_PROVIDER_SITE_OTHER): Payer: Medicare Other

## 2020-06-16 DIAGNOSIS — R55 Syncope and collapse: Secondary | ICD-10-CM

## 2020-06-18 LAB — CUP PACEART REMOTE DEVICE CHECK
Battery Remaining Longevity: 112 mo
Battery Remaining Percentage: 95.5 %
Battery Voltage: 2.99 V
Brady Statistic AP VP Percent: 89 %
Brady Statistic AP VS Percent: 1 %
Brady Statistic AS VP Percent: 11 %
Brady Statistic AS VS Percent: 1 %
Brady Statistic RA Percent Paced: 86 %
Brady Statistic RV Percent Paced: 99 %
Date Time Interrogation Session: 20211005020015
Implantable Lead Implant Date: 20130321
Implantable Lead Implant Date: 20130321
Implantable Lead Location: 753859
Implantable Lead Location: 753860
Implantable Pulse Generator Implant Date: 20190503
Lead Channel Impedance Value: 360 Ohm
Lead Channel Impedance Value: 640 Ohm
Lead Channel Pacing Threshold Amplitude: 0.625 V
Lead Channel Pacing Threshold Amplitude: 0.75 V
Lead Channel Pacing Threshold Pulse Width: 0.4 ms
Lead Channel Pacing Threshold Pulse Width: 0.4 ms
Lead Channel Sensing Intrinsic Amplitude: 1.3 mV
Lead Channel Sensing Intrinsic Amplitude: 12 mV
Lead Channel Setting Pacing Amplitude: 0.875
Lead Channel Setting Pacing Amplitude: 2 V
Lead Channel Setting Pacing Pulse Width: 0.4 ms
Lead Channel Setting Sensing Sensitivity: 6 mV
Pulse Gen Model: 2272
Pulse Gen Serial Number: 9019281

## 2020-06-19 NOTE — Progress Notes (Signed)
Remote pacemaker transmission.   

## 2020-07-02 ENCOUNTER — Ambulatory Visit: Payer: Medicare Other | Admitting: Cardiology

## 2020-07-14 ENCOUNTER — Ambulatory Visit: Payer: Medicare Other | Admitting: Cardiology

## 2020-09-03 DIAGNOSIS — I255 Ischemic cardiomyopathy: Secondary | ICD-10-CM | POA: Insufficient documentation

## 2020-09-03 DIAGNOSIS — J069 Acute upper respiratory infection, unspecified: Secondary | ICD-10-CM | POA: Insufficient documentation

## 2020-09-03 DIAGNOSIS — I442 Atrioventricular block, complete: Secondary | ICD-10-CM | POA: Insufficient documentation

## 2020-09-03 DIAGNOSIS — D649 Anemia, unspecified: Secondary | ICD-10-CM | POA: Insufficient documentation

## 2020-09-03 DIAGNOSIS — R609 Edema, unspecified: Secondary | ICD-10-CM | POA: Insufficient documentation

## 2020-09-03 DIAGNOSIS — R55 Syncope and collapse: Secondary | ICD-10-CM | POA: Insufficient documentation

## 2020-09-03 DIAGNOSIS — H9190 Unspecified hearing loss, unspecified ear: Secondary | ICD-10-CM | POA: Insufficient documentation

## 2020-09-03 DIAGNOSIS — I480 Paroxysmal atrial fibrillation: Secondary | ICD-10-CM | POA: Insufficient documentation

## 2020-09-14 ENCOUNTER — Ambulatory Visit: Payer: Medicare Other | Admitting: Cardiology

## 2020-09-15 ENCOUNTER — Ambulatory Visit (INDEPENDENT_AMBULATORY_CARE_PROVIDER_SITE_OTHER): Payer: Medicare Other

## 2020-09-15 DIAGNOSIS — I442 Atrioventricular block, complete: Secondary | ICD-10-CM

## 2020-09-15 LAB — CUP PACEART REMOTE DEVICE CHECK
Battery Remaining Longevity: 112 mo
Battery Remaining Percentage: 95.5 %
Battery Voltage: 2.98 V
Brady Statistic AP VP Percent: 90 %
Brady Statistic AP VS Percent: 1 %
Brady Statistic AS VP Percent: 10 %
Brady Statistic AS VS Percent: 1 %
Brady Statistic RA Percent Paced: 84 %
Brady Statistic RV Percent Paced: 99 %
Date Time Interrogation Session: 20220104025153
Implantable Lead Implant Date: 20130321
Implantable Lead Implant Date: 20130321
Implantable Lead Location: 753859
Implantable Lead Location: 753860
Implantable Pulse Generator Implant Date: 20190503
Lead Channel Impedance Value: 360 Ohm
Lead Channel Impedance Value: 640 Ohm
Lead Channel Pacing Threshold Amplitude: 0.5 V
Lead Channel Pacing Threshold Amplitude: 0.75 V
Lead Channel Pacing Threshold Pulse Width: 0.4 ms
Lead Channel Pacing Threshold Pulse Width: 0.4 ms
Lead Channel Sensing Intrinsic Amplitude: 1.3 mV
Lead Channel Sensing Intrinsic Amplitude: 12 mV
Lead Channel Setting Pacing Amplitude: 0.75 V
Lead Channel Setting Pacing Amplitude: 2 V
Lead Channel Setting Pacing Pulse Width: 0.4 ms
Lead Channel Setting Sensing Sensitivity: 6 mV
Pulse Gen Model: 2272
Pulse Gen Serial Number: 9019281

## 2020-09-29 NOTE — Progress Notes (Signed)
Remote pacemaker transmission.   

## 2020-10-07 ENCOUNTER — Other Ambulatory Visit: Payer: Self-pay | Admitting: Cardiology

## 2020-10-07 NOTE — Telephone Encounter (Signed)
Prescription refill request for Xarelto received.  Indication:atrial fibrillation Last office visit:9/21 camnitz Weight:88.9 kg Age:87 Scr:needs labs CrCl:needs labs  Prescription refilled  

## 2020-11-03 ENCOUNTER — Other Ambulatory Visit: Payer: Self-pay | Admitting: Cardiology

## 2020-11-03 NOTE — Telephone Encounter (Signed)
Refill sent to pharmacy.   

## 2020-11-03 NOTE — Telephone Encounter (Signed)
Prescription refill request for Xarelto received.  Indication:atrial fibrillation Last office visit:9/21 camnitz Weight:88.9 kg Age:87 Scr:1.5 08/13/20 CrCl:38

## 2020-11-03 NOTE — Telephone Encounter (Signed)
Prescription refill request for Xarelto received.  Indication:atrial fibrillation Last office visit:9/21 camnitz Weight:88.9 kg Age:87 UEK:CMKLK labs CrCl:needs labs  Prescription refilled

## 2020-12-01 ENCOUNTER — Other Ambulatory Visit: Payer: Self-pay

## 2020-12-02 ENCOUNTER — Encounter: Payer: Self-pay | Admitting: Cardiology

## 2020-12-02 ENCOUNTER — Other Ambulatory Visit: Payer: Self-pay

## 2020-12-02 ENCOUNTER — Ambulatory Visit: Payer: Medicare Other | Admitting: Cardiology

## 2020-12-02 VITALS — BP 122/70 | HR 71 | Ht 66.5 in | Wt 195.0 lb

## 2020-12-02 DIAGNOSIS — I480 Paroxysmal atrial fibrillation: Secondary | ICD-10-CM

## 2020-12-02 DIAGNOSIS — I10 Essential (primary) hypertension: Secondary | ICD-10-CM

## 2020-12-02 DIAGNOSIS — I442 Atrioventricular block, complete: Secondary | ICD-10-CM | POA: Diagnosis not present

## 2020-12-02 DIAGNOSIS — Z950 Presence of cardiac pacemaker: Secondary | ICD-10-CM

## 2020-12-02 DIAGNOSIS — I255 Ischemic cardiomyopathy: Secondary | ICD-10-CM

## 2020-12-02 NOTE — Progress Notes (Signed)
Cardiology Office Note:    Date:  12/02/2020   ID:  Judith Flores, DOB [birth year], MRN [id number]  PCP:  John Giovanni, MD  Cardiologist:  Gypsy Balsam, MD    Referring MD: [name], MD   Chief Complaint  Patient presents with  . Follow-up  I am doing fine  History of Present Illness:    Judith Flores is a 87 y.o. female with past medical history significant for coronary artery disease.  In 2019 she required stenting to mid LAD with drug-eluting stent.  At that time she was also find to have completely occluded small first diagonal branch.  Past medical history is also significant for dyslipidemia with some difficulty tolerating statin, also a pacemaker is present.  This is Abbott device. She comes today 2 months of follow-up overall she says she is feeling better denies have any chest pain tightness squeezing pressure burning chest.  She said she got more energy she is able to do more.  Past Medical History:  Diagnosis Date  . Acute bronchitis 01/17/2011  . Acute cystitis with hematuria 01/17/2011   Last Assessment & Plan:  Formatting of this note might be different from the original. Patient presents with history and physical exam as documented low that is most consistent with likely acute cystitis with hematuria. Unlikely to be vaginal source per patient and as such do not suspect that this should remain on the differential. Unlikely that this represents underlying malignancy with her point  . Acute URI 08/19/2015  . Anemia   . Asteatotic dermatitis 10/16/2017  . Atrial fibrillation (HCC) 02/16/2012  . Atrioventricular block, complete (HCC) 11/17/2011  . B12 deficiency 08/10/2016  . Back pain 03/27/2014  . Bilateral lower extremity edema 02/07/2017  . CAD (coronary artery disease)    a. 08/2018 NSTEMI/PCI: LM nl, LAD 95p/m (3.5 x 12 mm resolute Onyx DES and 2.5 x 38 mm resolute Onyx DES successfully placed), D1 100, D2 40, RI 95 (2.25 x 38 mm resolute Onyx DES), LCx 30p, RCA 68m  40d.  . CKD (chronic kidney disease), stage III (HCC) 09/04/2018  . Complete heart block (HCC)    intermittent  . Depression screening 11/13/2013   Overview:  PHQ2 Done 11/13/13  . Elevated troponin 09/04/2018  . Epigastric pain 09/04/2018  . GERD (gastroesophageal reflux disease)   . HOH (hard of hearing)   . HTN (hypertension)   . Hyperlipidemia 08/12/2010  . Hypertension 02/16/2012  . Hypertension, benign 08/12/2010   Last Assessment & Plan:  Formatting of this note might be different from the original. Controlled based on today's blood pressure of 120/74. Last BMP was checked 08/13/2020 and showed creatinine at 1.50 which appears to be patient's baseline and mild hypokalemia at 3.3. Patient is taking antihypertensive regimen of diltiazem 120 mg PO QD, Lasix 20 mg PO QD, metoprolol tartrate 50 mg PO BID, Klor-Co  . Iron deficiency 08/19/2015  . Ischemic cardiomyopathy    a. 08/2018 Echo: Ef 50-55%, mild apical anteroseptal and apical HK. Gr1 DD. Triv TR. PaSP .  . NSTEMI (non-ST elevated myocardial infarction) (HCC) 09/04/2018  . Pacemaker   . PAF (paroxysmal atrial fibrillation) (HCC)   . Pain of right lower extremity 06/12/2017  . Peripheral edema   . polarity lead revision in both atrial and ventricular lead 11/17/2011  . Recurrent upper respiratory infection (URI)    HX OF CHRONIC BRONCHITIS  . Stage 3b chronic kidney disease (HCC) 08/19/2015   Last Assessment & Plan:  Formatting  of this note might be different from the original. Stable at this time given creatinine 08/13/2020 was 1.50 which appears to be near patient's baseline. Last magnesium and phosphorous were checked on 08/13/2020 and were within normal limits. Last CBC was checked on 08/13/2020 and was normal. Last urine microalbumin to creatinine ratio was normal on 08/13/2020 Izell [place]  . Statin intolerance 10/19/2018  . Syncope   . SYNCOPE, HX OF 03/17/2009   Qualifier: Diagnosis of  By: Graciela Husbands, MD, [facility], Ty Hilts   . Vitamin D  deficiency 08/12/2010    Past Surgical History:  Procedure Laterality Date  . breast tumor    . CORONARY STENT INTERVENTION N/A 09/07/2018   Procedure: CORONARY STENT INTERVENTION;  Surgeon: Tonny Bollman, MD;  Location: [facility] INVASIVE CV LAB;  Service: Cardiovascular;  Laterality: N/A;  . INSERT / REPLACE / REMOVE PACEMAKER     St [name]  . LEFT HEART CATH AND CORONARY ANGIOGRAPHY N/A 09/06/2018   Procedure: LEFT HEART CATH AND CORONARY ANGIOGRAPHY;  Surgeon: Tonny Bollman, MD;  Location: [facility] INVASIVE CV LAB;  Service: Cardiovascular;  Laterality: N/A;  . PACEMAKER REVISION N/A 12/01/2011   Procedure: PACEMAKER REVISION;  Surgeon: [name], MD;  Location: [facility] CATH LAB;  Service: Cardiovascular;  Laterality: N/A;  . PPM GENERATOR CHANGEOUT N/A 01/12/2018   Procedure: PPM GENERATOR CHANGEOUT;  Surgeon: [name], MD;  Location: [facility] INVASIVE CV LAB;  Service: Cardiovascular;  Laterality: N/A;    Current Medications: Current Meds  Medication Sig  . clopidogrel (PLAVIX) 75 MG tablet Take 1 tablet (75 mg total) by mouth daily with breakfast.  . Coenzyme Q10 (COQ10) 100 MG CAPS Take 1 capsule by mouth daily.  . Cyanocobalamin (VITAMIN B-12 IJ) Inject 1,000 mg as directed every 30 (thirty) days.   [name] diltiazem (TIAZAC) 120 MG 24 hr capsule Take 1 capsule by mouth daily.  . ferrous sulfate 325 (65 FE) MG EC tablet Take 1 tablet (325 mg total) by mouth 3 (three) times daily with meals. (Patient taking differently: Take 325 mg by mouth daily.)  . furosemide (LASIX) 20 MG tablet Take 20 mg by mouth as needed for edema.  . hydrochlorothiazide (MICROZIDE) 12.5 MG capsule Take 12.5 mg by mouth daily.  . metoprolol succinate (TOPROL-XL) 100 MG 24 hr tablet Take 1.5 tablets (150 mg total) by mouth daily. Take with or immediately following a meal.  . nitrofurantoin (MACRODANTIN) 100 MG capsule Take 100 mg by mouth 2 (two) times daily. For 5 days  . nitroGLYCERIN (NITROSTAT) 0.4 MG SL tablet Place 1  tablet (0.4 mg total) under the tongue every 5 (five) minutes x 3 doses as needed for chest pain.  [name] omeprazole (PRILOSEC) 20 MG capsule Take 20 mg by mouth daily.   . potassium chloride (KLOR-CON) 10 MEQ tablet Take 20 mEq by mouth daily.  . rosuvastatin (CRESTOR) 5 MG tablet TAKE 1 TABLET(5 MG) BY MOUTH 3 TIMES A WEEK (Patient taking differently: Take 5 mg by mouth 3 (three) times a week. Sunday, Wednesdays and Fridays)  . Vitamin D, Ergocalciferol, (DRISDOL) 1.25 MG (50000 UT) CAPS capsule Take 50,000 Units by mouth every 7 (seven) days.  Carlena Hurl 15 MG TABS tablet TAKE 1 TABLET BY MOUTH EVERY DAY (Patient taking differently: Take 15 mg by mouth daily with supper.)     Allergies:   Atorvastatin, Crestor [rosuvastatin calcium], Niacin, Sulfa antibiotics, and Penicillins   Social History   Socioeconomic History  . Marital status: Widowed    Spouse name:  Not on file  . Number of children: Not on file  . Years of education: Not on file  . Highest education level: Not on file  Occupational History  . Not on file  Tobacco Use  . Smoking status: Never Smoker  . Smokeless tobacco: Never Used  Vaping Use  . Vaping Use: Never used  Substance and Sexual Activity  . Alcohol use: No  . Drug use: No  . Sexual activity: Never    Birth control/protection: Post-menopausal  Other Topics Concern  . Not on file  Social History Narrative  . Not on file   Social Determinants of Health   Financial Resource Strain: Not on file  Food Insecurity: Not on file  Transportation Needs: Not on file  Physical Activity: Not on file  Stress: Not on file  Social Connections: Not on file     Family History: The patient's family history includes Heart disease in her father and mother. ROS:   Please see the history of present illness.    All 14 point review of systems negative except as described per history of present illness  EKGs/Labs/Other Studies Reviewed:      Recent Labs: No results found  for requested labs within last 8760 hours.  Recent Lipid Panel    Component Value Date/Time   CHOL 182 09/19/2019 0000   TRIG 174 (H) 09/19/2019 0000   HDL 41 09/19/2019 0000   CHOLHDL 4.4 09/19/2019 0000   CHOLHDL 6.2 09/05/2018 0048   VLDL 37 09/05/2018 0048   LDLCALC 110 (H) 09/19/2019 0000    Physical Exam:    VS:  BP 122/70 (BP Location: Right Arm, Patient Position: Sitting)   Pulse 71   Ht 5' 6.5" (1.689 m)   Wt 195 lb (88.5 kg)   SpO2 97%   BMI 31.00 kg/m     Wt Readings from Last 3 Encounters:  12/02/20 195 lb (88.5 kg)  05/25/20 196 lb (88.9 kg)  03/02/20 199 lb 6.4 oz (90.4 kg)     GEN:  Well nourished, well developed in no acute distress HEENT: Normal NECK: No JVD; No carotid bruits LYMPHATICS: No lymphadenopathy CARDIAC: RRR, no murmurs, no rubs, no gallops RESPIRATORY:  Clear to auscultation without rales, wheezing or rhonchi  ABDOMEN: Soft, non-tender, non-distended MUSCULOSKELETAL:  No edema; No deformity  SKIN: Warm and dry LOWER EXTREMITIES: no swelling NEUROLOGIC:  Alert and oriented x 3 PSYCHIATRIC:  Normal affect   ASSESSMENT:    1. PAF (paroxysmal atrial fibrillation) (HCC)   2. Ischemic cardiomyopathy   3. Hypertension, benign   4. Complete heart block (HCC)   5. Pacemaker-St Judes    PLAN:    In order of problems listed above:  1. Paroxysmal atrial fibrillation, she is anticoagulated.  I did review interrogation of the device that she has which showed multiple mode switches but none of this mode switches are related to noise from the atrial channel.  She is anticoagulated and she is asymptomatic I will continue present management. 2. Essential hypertension, blood pressure well controlled we will continue present management. 3. Dyslipidemia, she is able to tolerate very small dose of statin.  Apparently she had cholesterol scheduled to be checked by her primary care physician but I do not see results of it.  Will call the office to get  report. 4. St. [name]'s/Abbott pacemaker present.  Interrogation reviewed.  Noise from atrial channel causing mode switch.  But otherwise normal function   Medication Adjustments/Labs and Tests Ordered: Current  medicines are reviewed at length with the patient today.  Concerns regarding medicines are outlined above.  No orders of the defined types were placed in this encounter.  Medication changes: No orders of the defined types were placed in this encounter.   Signed, Georgeanna Lea, MD, [facility] 12/02/2020 1:23 PM    [facility]

## 2020-12-02 NOTE — Patient Instructions (Signed)

## 2020-12-15 ENCOUNTER — Ambulatory Visit (INDEPENDENT_AMBULATORY_CARE_PROVIDER_SITE_OTHER): Payer: Medicare Other

## 2020-12-15 DIAGNOSIS — I442 Atrioventricular block, complete: Secondary | ICD-10-CM | POA: Diagnosis not present

## 2020-12-15 LAB — CUP PACEART REMOTE DEVICE CHECK
Battery Remaining Longevity: 112 mo
Battery Remaining Percentage: 95.5 %
Battery Voltage: 2.98 V
Brady Statistic AP VP Percent: 89 %
Brady Statistic AP VS Percent: 1 %
Brady Statistic AS VP Percent: 11 %
Brady Statistic AS VS Percent: 1 %
Brady Statistic RA Percent Paced: 83 %
Brady Statistic RV Percent Paced: 99 %
Date Time Interrogation Session: 20220405023031
Implantable Lead Implant Date: 20130321
Implantable Lead Implant Date: 20130321
Implantable Lead Location: 753859
Implantable Lead Location: 753860
Implantable Pulse Generator Implant Date: 20190503
Lead Channel Impedance Value: 360 Ohm
Lead Channel Impedance Value: 630 Ohm
Lead Channel Pacing Threshold Amplitude: 0.5 V
Lead Channel Pacing Threshold Amplitude: 0.875 V
Lead Channel Pacing Threshold Pulse Width: 0.4 ms
Lead Channel Pacing Threshold Pulse Width: 0.4 ms
Lead Channel Sensing Intrinsic Amplitude: 1.4 mV
Lead Channel Sensing Intrinsic Amplitude: 12 mV
Lead Channel Setting Pacing Amplitude: 0.75 V
Lead Channel Setting Pacing Amplitude: 2 V
Lead Channel Setting Pacing Pulse Width: 0.4 ms
Lead Channel Setting Sensing Sensitivity: 6 mV
Pulse Gen Model: 2272
Pulse Gen Serial Number: 9019281

## 2020-12-25 NOTE — Progress Notes (Signed)
Remote pacemaker transmission.   

## 2021-01-17 ENCOUNTER — Other Ambulatory Visit: Payer: Self-pay | Admitting: Cardiology

## 2021-03-03 ENCOUNTER — Encounter: Payer: Self-pay | Admitting: Cardiology

## 2021-03-16 ENCOUNTER — Ambulatory Visit (INDEPENDENT_AMBULATORY_CARE_PROVIDER_SITE_OTHER): Payer: Medicare Other

## 2021-03-16 DIAGNOSIS — I442 Atrioventricular block, complete: Secondary | ICD-10-CM | POA: Diagnosis not present

## 2021-03-16 LAB — CUP PACEART REMOTE DEVICE CHECK
Battery Remaining Longevity: 73 mo
Battery Remaining Percentage: 66 %
Battery Voltage: 2.98 V
Brady Statistic AP VP Percent: 87 %
Brady Statistic AP VS Percent: 1 %
Brady Statistic AS VP Percent: 13 %
Brady Statistic AS VS Percent: 1 %
Brady Statistic RA Percent Paced: 82 %
Brady Statistic RV Percent Paced: 99 %
Date Time Interrogation Session: 20220705031418
Implantable Lead Implant Date: 20130321
Implantable Lead Implant Date: 20130321
Implantable Lead Location: 753859
Implantable Lead Location: 753860
Implantable Pulse Generator Implant Date: 20190503
Lead Channel Impedance Value: 350 Ohm
Lead Channel Impedance Value: 630 Ohm
Lead Channel Pacing Threshold Amplitude: 0.5 V
Lead Channel Pacing Threshold Amplitude: 0.75 V
Lead Channel Pacing Threshold Pulse Width: 0.4 ms
Lead Channel Pacing Threshold Pulse Width: 0.4 ms
Lead Channel Sensing Intrinsic Amplitude: 1.5 mV
Lead Channel Sensing Intrinsic Amplitude: 12 mV
Lead Channel Setting Pacing Amplitude: 0.75 V
Lead Channel Setting Pacing Amplitude: 2 V
Lead Channel Setting Pacing Pulse Width: 0.4 ms
Lead Channel Setting Sensing Sensitivity: 6 mV
Pulse Gen Model: 2272
Pulse Gen Serial Number: 9019281

## 2021-04-05 NOTE — Progress Notes (Signed)
Remote pacemaker transmission.   

## 2021-04-17 ENCOUNTER — Other Ambulatory Visit: Payer: Self-pay | Admitting: Cardiology

## 2021-05-09 ENCOUNTER — Other Ambulatory Visit: Payer: Self-pay | Admitting: Cardiology

## 2021-05-10 ENCOUNTER — Other Ambulatory Visit: Payer: Self-pay | Admitting: Cardiology

## 2021-05-11 NOTE — Telephone Encounter (Signed)
SCr was 1.3 last month, CrCl is 44, ok to refill

## 2021-05-11 NOTE — Telephone Encounter (Signed)
NEED PERMISSION FROM PHARMD TO ORDER LABS. ROUTING TO THE PHARMD POOL Prescription refill request for Xarelto received.  Indication:afib Last office visit:krasowski 12/02/20 Weight:88.5kg Age:69f Scr:1.370 mg/ 10/19/2018 OVERDUE CrCl:4

## 2021-06-15 ENCOUNTER — Ambulatory Visit (INDEPENDENT_AMBULATORY_CARE_PROVIDER_SITE_OTHER): Payer: Medicare Other

## 2021-06-15 DIAGNOSIS — I442 Atrioventricular block, complete: Secondary | ICD-10-CM

## 2021-06-15 LAB — CUP PACEART REMOTE DEVICE CHECK
Battery Remaining Longevity: 68 mo
Battery Remaining Percentage: 63 %
Battery Voltage: 2.98 V
Brady Statistic AP VP Percent: 87 %
Brady Statistic AP VS Percent: 1 %
Brady Statistic AS VP Percent: 13 %
Brady Statistic AS VS Percent: 1 %
Brady Statistic RA Percent Paced: 81 %
Brady Statistic RV Percent Paced: 99 %
Date Time Interrogation Session: 20221004055237
Implantable Lead Implant Date: 20130321
Implantable Lead Implant Date: 20130321
Implantable Lead Location: 753859
Implantable Lead Location: 753860
Implantable Pulse Generator Implant Date: 20190503
Lead Channel Impedance Value: 330 Ohm
Lead Channel Impedance Value: 680 Ohm
Lead Channel Pacing Threshold Amplitude: 0.625 V
Lead Channel Pacing Threshold Amplitude: 0.75 V
Lead Channel Pacing Threshold Pulse Width: 0.4 ms
Lead Channel Pacing Threshold Pulse Width: 0.4 ms
Lead Channel Sensing Intrinsic Amplitude: 1.2 mV
Lead Channel Sensing Intrinsic Amplitude: 12 mV
Lead Channel Setting Pacing Amplitude: 0.875
Lead Channel Setting Pacing Amplitude: 2 V
Lead Channel Setting Pacing Pulse Width: 0.4 ms
Lead Channel Setting Sensing Sensitivity: 6 mV
Pulse Gen Model: 2272
Pulse Gen Serial Number: 9019281

## 2021-06-22 ENCOUNTER — Other Ambulatory Visit: Payer: Self-pay | Admitting: Cardiology

## 2021-06-23 NOTE — Progress Notes (Signed)
Remote pacemaker transmission.   

## 2021-06-28 ENCOUNTER — Ambulatory Visit: Payer: Medicare Other | Admitting: Cardiology

## 2021-07-06 DIAGNOSIS — N2581 Secondary hyperparathyroidism of renal origin: Secondary | ICD-10-CM | POA: Insufficient documentation

## 2021-07-12 ENCOUNTER — Encounter: Payer: Self-pay | Admitting: Cardiology

## 2021-07-12 ENCOUNTER — Ambulatory Visit (INDEPENDENT_AMBULATORY_CARE_PROVIDER_SITE_OTHER): Payer: Medicare Other | Admitting: Cardiology

## 2021-07-12 ENCOUNTER — Ambulatory Visit: Payer: Medicare Other | Admitting: Cardiology

## 2021-07-12 ENCOUNTER — Other Ambulatory Visit: Payer: Self-pay

## 2021-07-12 VITALS — BP 148/57 | HR 70 | Ht 66.5 in | Wt 195.0 lb

## 2021-07-12 DIAGNOSIS — I442 Atrioventricular block, complete: Secondary | ICD-10-CM | POA: Diagnosis not present

## 2021-07-12 NOTE — Patient Instructions (Signed)
Medication Instructions:  Your physician recommends that you continue on your current medications as directed. Please refer to the Current Medication list given to you today.  *If you need a refill on your cardiac medications before your next appointment, please call your pharmacy*   Lab Work: None ordered   Testing/Procedures: None ordered   Follow-Up: At [facility], you and your health needs are our priority.  As part of our continuing mission to provide you with exceptional heart care, we have created designated Provider Care Teams.  These Care Teams include your primary Cardiologist (physician) and Advanced Practice Providers (APPs -  Physician Assistants and Nurse Practitioners) who all work together to provide you with the care you need, when you need it.  Remote monitoring is used to monitor your Pacemaker or ICD from home. This monitoring reduces the number of office visits required to check your device to one time per year. It allows [place] to keep an eye on the functioning of your device to ensure it is working properly. You are scheduled for a device check from home on 1/032/2023. You may send your transmission at any time that day. If you have a wireless device, the transmission will be sent automatically. After your physician reviews your transmission, you will receive a postcard with your next transmission date.  Your next appointment:   1 year(s)  The format for your next appointment:   In Person  Provider:   Loman Brooklyn, MD   Thank you for choosing [facility]!!   Dory Horn, RN [phone]

## 2021-07-12 NOTE — Progress Notes (Signed)
Electrophysiology Office Note   Date:  07/12/2021   ID:  Gustine, Fancy [birth year], MRN [id number]  PCP:  Jacklynn Ganong, MD  Cardiologist:  Agustin Cree Primary Electrophysiologist:  Finbar Nippert Meredith Leeds, MD    No chief complaint on file.    History of Present Illness: Judith Flores is a 87 y.o. female who is being seen today for the evaluation of pacemaker at the request of Jacklynn Ganong, MD. Presenting today for electrophysiology evaluation.  She has a history significant for coronary artery disease status post LAD stent December 2019 after non-STEMI, hypertension, hyperlipidemia, complete heart block, and atrial fibrillation.  She is status post Public house manager.  Today, denies symptoms of palpitations, chest pain, shortness of breath, orthopnea, PND, lower extremity edema, claudication, dizziness, presyncope, syncope, bleeding, or neurologic sequela. The patient is tolerating medications without difficulties.  Since being seen she has done well.  She has no chest pain or shortness of breath.  She is unaware of any arrhythmias.  She is overall happy with her control and is able to do all of her daily activities.  Past Medical History:  Diagnosis Date   Acute bronchitis 01/17/2011   Acute cystitis with hematuria 01/17/2011   Last Assessment & Plan:  Formatting of this note might be different from the original. Patient presents with history and physical exam as documented low that is most consistent with likely acute cystitis with hematuria. Unlikely to be vaginal source per patient and as such do not suspect that this should remain on the differential. Unlikely that this represents underlying malignancy with her point   Acute URI 08/19/2015   Anemia    Asteatotic dermatitis 10/16/2017   Atrial fibrillation ([place]) 02/16/2012   Atrioventricular block, complete ([place]) 11/17/2011   B12 deficiency 08/10/2016   Back pain 03/27/2014   Bilateral lower extremity edema 02/07/2017    CAD (coronary artery disease)    a. 08/2018 NSTEMI/PCI: LM nl, LAD 95p/m (3.5 x 12 mm resolute Onyx DES and 2.5 x 38 mm resolute Onyx DES successfully placed), D1 100, D2 40, RI 95 (2.25 x 38 mm resolute Onyx DES), LCx 30p, RCA 57m 40d.   CKD (chronic kidney disease), stage III ([place]) 09/04/2018   Complete heart block (HCC)    intermittent   Depression screening 11/13/2013   Overview:  PHQ2 Done 11/13/13   Elevated troponin 09/04/2018   Epigastric pain 09/04/2018   GERD (gastroesophageal reflux disease)    HOH (hard of hearing)    HTN (hypertension)    Hyperlipidemia 08/12/2010   Hypertension 02/16/2012   Hypertension, benign 08/12/2010   Last Assessment & Plan:  Formatting of this note might be different from the original. Controlled based on today's blood pressure of 120/74. Last BMP was checked 08/13/2020 and showed creatinine at 1.50 which appears to be patient's baseline and mild hypokalemia at 3.3. Patient is taking antihypertensive regimen of diltiazem 120 mg PO QD, Lasix 20 mg PO QD, metoprolol tartrate 50 mg PO BID, Klor-Co   Iron deficiency 08/19/2015   Ischemic cardiomyopathy    a. 08/2018 Echo: Ef 50-55%, mild apical anteroseptal and apical HK. Gr1 DD. Triv TR. PaSP 38mmHg.   NSTEMI (non-ST elevated myocardial infarction) ([place]) 09/04/2018   Pacemaker    PAF (paroxysmal atrial fibrillation) (HCC)    Pain of right lower extremity 06/12/2017   Peripheral edema    polarity lead revision in both atrial and ventricular lead 11/17/2011   Recurrent upper respiratory infection (URI)  HX OF CHRONIC BRONCHITIS   Stage 3b chronic kidney disease ([place]) 08/19/2015   Last Assessment & Plan:  Formatting of this note might be different from the original. Stable at this time given creatinine 08/13/2020 was 1.50 which appears to be near patient's baseline. Last magnesium and phosphorous were checked on 08/13/2020 and were within normal limits. Last CBC was checked on 08/13/2020 and was normal. Last urine  microalbumin to creatinine ratio was normal on 08/13/2020 -Conti   Statin intolerance 10/19/2018   Syncope    SYNCOPE, HX OF 03/17/2009   Qualifier: Diagnosis of  By: Caryl Comes, MD, Remus Blake    Vitamin D deficiency 08/12/2010   Past Surgical History:  Procedure Laterality Date   breast tumor     CORONARY STENT INTERVENTION N/A 09/07/2018   Procedure: CORONARY STENT INTERVENTION;  Surgeon: Sherren Mocha, MD;  Location: [place] [facility];  Service: Cardiovascular;  Laterality: N/A;   INSERT / REPLACE / REMOVE PACEMAKER     St [name]   LEFT HEART CATH AND CORONARY ANGIOGRAPHY N/A 09/06/2018   Procedure: LEFT HEART CATH AND CORONARY ANGIOGRAPHY;  Surgeon: Sherren Mocha, MD;  Location: [place] [facility];  Service: Cardiovascular;  Laterality: N/A;   PACEMAKER REVISION N/A 12/01/2011   Procedure: PACEMAKER REVISION;  Surgeon: Deboraha Sprang, MD;  Location: [facility] CATH LAB;  Service: Cardiovascular;  Laterality: N/A;   PPM GENERATOR CHANGEOUT N/A 01/12/2018   Procedure: PPM GENERATOR CHANGEOUT;  Surgeon: Deboraha Sprang, MD;  Location: [place] [facility];  Service: Cardiovascular;  Laterality: N/A;     Current Outpatient Medications  Medication Sig Dispense Refill   clopidogrel (PLAVIX) 75 MG tablet Take 1 tablet (75 mg total) by mouth daily with breakfast. 30 tablet 6   Coenzyme Q10 (COQ10) 100 MG CAPS Take 1 capsule by mouth daily.     Cyanocobalamin (VITAMIN B-12 IJ) Inject 1,000 mg as directed every 30 (thirty) days.      diltiazem (CARDIZEM CD) 120 MG 24 hr capsule TAKE 1 CAPSULE(120 MG) BY MOUTH DAILY 90 capsule 2   ferrous sulfate 325 (65 FE) MG EC tablet Take 1 tablet (325 mg total) by mouth 3 (three) times daily with meals. (Patient taking differently: Take 325 mg by mouth daily.) 30 tablet 11   furosemide (LASIX) 20 MG tablet Take 20 mg by mouth as needed for edema.     hydrochlorothiazide (MICROZIDE) 12.5 MG capsule Take 12.5 mg by mouth daily.     metoprolol succinate (TOPROL-XL)  100 MG 24 hr tablet TAKE 1 AND 1/2 TABLETS(150 MG) BY MOUTH DAILY WITH OR IMMEDIATELY FOLLOWING A MEAL 135 tablet 0   nitrofurantoin (MACRODANTIN) 100 MG capsule Take 100 mg by mouth 2 (two) times daily. For 5 days     nitroGLYCERIN (NITROSTAT) 0.4 MG SL tablet Place 1 tablet (0.4 mg total) under the tongue every 5 (five) minutes x 3 doses as needed for chest pain. 25 tablet 3   omeprazole (PRILOSEC) 20 MG capsule Take 20 mg by mouth daily.      potassium chloride (KLOR-CON) 10 MEQ tablet Take 20 mEq by mouth daily.     Rivaroxaban (XARELTO) 15 MG TABS tablet Take 1 tablet (15 mg total) by mouth daily. Labs needed for further refills 90 tablet 1   rosuvastatin (CRESTOR) 5 MG tablet TAKE 1 TABLET(5 MG) BY MOUTH 3 TIMES A WEEK (Patient taking differently: Take 5 mg by mouth daily.) 36 tablet 1   Syringe/Needle, Disp, (SYRINGE 3CC/25GX1") 25G X  1" 3 ML MISC As needed for B12 injections  monthly     Vitamin D, Ergocalciferol, (DRISDOL) 1.25 MG (50000 UT) CAPS capsule Take 50,000 Units by mouth every 7 (seven) days.     XARELTO 15 MG TABS tablet TAKE 1 TABLET BY MOUTH EVERY DAY 90 tablet 1   No current facility-administered medications for this visit.    Allergies:   Atorvastatin, Crestor [rosuvastatin calcium], Niacin, Sulfa antibiotics, and Penicillins   Social History:  The patient  reports that she has never smoked. She has never used smokeless tobacco. She reports that she does not drink alcohol and does not use drugs.   Family History:  The patient's family history includes Heart disease in her father and mother.   ROS:  Please see the history of present illness.   Otherwise, review of systems is positive for none.   All other systems are reviewed and negative.   PHYSICAL EXAM: VS:  BP (!) 148/57   Pulse 70   Ht 5' 6.5" (1.689 m)   Wt 195 lb (88.5 kg)   SpO2 96%   BMI 31.00 kg/m  , BMI Body mass index is 31 kg/m. GEN: Well nourished, well developed, in no acute distress  HEENT:  normal  Neck: no JVD, carotid bruits, or masses Cardiac: RRR; no murmurs, rubs, or gallops,no edema  Respiratory:  clear to auscultation bilaterally, normal work of breathing GI: soft, nontender, nondistended, + BS MS: no deformity or atrophy  Skin: warm and dry, device site well healed Neuro:  Strength and sensation are intact Psych: euthymic mood, full affect  EKG:  EKG is ordered today. Personal review of the ekg ordered shows AF, V paced  Personal review of the device interrogation today. Results in Paceart    Recent Labs: No results found for requested labs within last 8760 hours.    Lipid Panel     Component Value Date/Time   CHOL 182 09/19/2019 0000   TRIG 174 (H) 09/19/2019 0000   HDL 41 09/19/2019 0000   CHOLHDL 4.4 09/19/2019 0000   CHOLHDL 6.2 09/05/2018 0048   VLDL 37 09/05/2018 0048   LDLCALC 110 (H) 09/19/2019 0000     Wt Readings from Last 3 Encounters:  07/12/21 195 lb (88.5 kg)  12/02/20 195 lb (88.5 kg)  05/25/20 196 lb (88.9 kg)      Other studies Reviewed: Additional studies/ records that were reviewed today include: TTE 09/04/18  Review of the above records today demonstrates:  - Left ventricle: The cavity size was normal. Systolic function was   normal. The estimated ejection fraction was in the range of 50%   to 55%. Mild hypokinesis of the apical anteroseptal and apical   myocardium. Doppler parameters are consistent with abnormal left   ventricular relaxation (grade 1 diastolic dysfunction). Doppler   parameters are consistent with indeterminate ventricular filling   pressure. - Aortic valve: Transvalvular velocity was within the normal range.   There was no stenosis. There was no regurgitation. Valve area   (VTI): 2.48 cm^2. Valve area (Vmean): 2.49 cm^2. - Mitral valve: Transvalvular velocity was within the normal range.   There was no evidence for stenosis. There was no regurgitation. - Right ventricle: The cavity size was normal.  Wall thickness was   normal. Systolic function was normal. - Atrial septum: No defect or patent foramen ovale was identified   by color flow Doppler. - Tricuspid valve: There was trivial regurgitation. - Pulmonary arteries: Systolic pressure was within the  normal   range. PA peak pressure: 13 mm Hg (S).  LHC 09/07/18 Prox LAD to Mid LAD lesion is 95% stenosed. Ost 1st Diag lesion is 100% stenosed. Ost 2nd Diag to 2nd Diag lesion is 40% stenosed. Prox Cx lesion is 30% stenosed with 30% stenosed side branch in Ost 1st Mrg. Mid RCA lesion is 70% stenosed. Dist RCA lesion is 40% stenosed. Ost Ramus to Ramus lesion is 95% stenosed. A drug-eluting stent was successfully placed using a STENT RESOLUTE ONYX [id number]. Post intervention, there is a 0% residual stenosis. A drug-eluting stent was successfully placed using a STENT RESOLUTE ONYX 2.5X38. Post intervention, there is a 0% residual stenosis.   ASSESSMENT AND PLAN:  1.  Paroxysmal atrial fibrillation: Less than 5% burden on pacemaker interrogation.  Currently on metoprolol, diltiazem, Xarelto.  CHA2DS2-VASc of 4.  Most recent burden remains at 5%.  We Curtis Cain continue with current management.  She is asymptomatic.  2.  Complete heart block: Status post Saint [name] dual-chamber pacemaker.  Device functioning appropriately.  No changes at this time.  3.  Coronary artery disease: Status post LAD stent.  No current chest pain.  Current medicines are reviewed at length with the patient today.   The patient does not have concerns regarding her medicines.  The following changes were made today: None  Labs/ tests ordered today include:  Orders Placed This Encounter  Procedures   EKG 12-Lead      Disposition:   FU with Tige Meas 1 year  Signed, Sherlene Rickel Meredith Leeds, MD  07/12/2021 10:50 AM     [facility] [address] [place] [address] [phone] (office) [phone] (fax)

## 2021-07-29 ENCOUNTER — Ambulatory Visit: Payer: Medicare Other | Admitting: Cardiology

## 2021-08-27 ENCOUNTER — Ambulatory Visit: Payer: Medicare Other | Admitting: Cardiology

## 2021-08-27 ENCOUNTER — Other Ambulatory Visit: Payer: Self-pay

## 2021-08-27 VITALS — BP 128/74 | HR 91 | Ht 66.0 in | Wt 194.6 lb

## 2021-08-27 DIAGNOSIS — I2510 Atherosclerotic heart disease of native coronary artery without angina pectoris: Secondary | ICD-10-CM | POA: Diagnosis not present

## 2021-08-27 DIAGNOSIS — I480 Paroxysmal atrial fibrillation: Secondary | ICD-10-CM

## 2021-08-27 DIAGNOSIS — I2583 Coronary atherosclerosis due to lipid rich plaque: Secondary | ICD-10-CM

## 2021-08-27 DIAGNOSIS — I442 Atrioventricular block, complete: Secondary | ICD-10-CM | POA: Diagnosis not present

## 2021-08-27 DIAGNOSIS — Z950 Presence of cardiac pacemaker: Secondary | ICD-10-CM

## 2021-08-27 DIAGNOSIS — E782 Mixed hyperlipidemia: Secondary | ICD-10-CM | POA: Diagnosis not present

## 2021-08-27 NOTE — Progress Notes (Signed)
Cardiology Office Note:    Date:  08/27/2021   ID:  Judith Flores, DOB [birth year], MRN [id number]  PCP:  Jacklynn Ganong, MD  Cardiologist:  Jenne Campus, MD    Referring MD: Jacklynn Ganong, MD   Chief Complaint  Patient presents with   heart eval  Doing well  History of Present Illness:    Judith Flores is a 87 y.o. female with past medical history significant for coronary artery disease.  In 2019 she required stenting of the mid LAD with drug-eluting stent at that time she was also find to have completely occluded small diagonal branch.  Additional past medical history includes dyslipidemia with some difficulty tolerating statin, she does have a pacemaker secondary to IV block which was Abbott device. She is here in my office today she seems to be doing well she denies have any chest pain tightness squeezing pressure burning chest.  She walks around with a cane she lives alone but her daughter lives close to her.  She says she is trying to be independent as long as she will be able to.  She is 87 years old and off of me she is doing quite well.  She denies have any chest pain, tightness, pressure, burning chest no palpitations no dizziness no swelling of lower extremities.  Past Medical History:  Diagnosis Date   Acute bronchitis 01/17/2011   Acute cystitis with hematuria 01/17/2011   Last Assessment & Plan:  Formatting of this note might be different from the original. Patient presents with history and physical exam as documented low that is most consistent with likely acute cystitis with hematuria. Unlikely to be vaginal source per patient and as such do not suspect that this should remain on the differential. Unlikely that this represents underlying malignancy with her point   Acute URI 08/19/2015   Anemia    Asteatotic dermatitis 10/16/2017   Atrial fibrillation ([place]) 02/16/2012   Atrioventricular block, complete ([place]) 11/17/2011   B12 deficiency 08/10/2016   Back pain 03/27/2014    Bilateral lower extremity edema 02/07/2017   CAD (coronary artery disease)    a. 08/2018 NSTEMI/PCI: LM nl, LAD 95p/m (3.5 x 12 mm resolute Onyx DES and 2.5 x 38 mm resolute Onyx DES successfully placed), D1 100, D2 40, RI 95 (2.25 x 38 mm resolute Onyx DES), LCx 30p, RCA 61m 40d.   CKD (chronic kidney disease), stage III ([place]) 09/04/2018   Complete heart block (HCC)    intermittent   Depression screening 11/13/2013   Overview:  PHQ2 Done 11/13/13   Elevated troponin 09/04/2018   Epigastric pain 09/04/2018   GERD (gastroesophageal reflux disease)    HOH (hard of hearing)    HTN (hypertension)    Hyperlipidemia 08/12/2010   Hypertension 02/16/2012   Hypertension, benign 08/12/2010   Last Assessment & Plan:  Formatting of this note might be different from the original. Controlled based on today's blood pressure of 120/74. Last BMP was checked 08/13/2020 and showed creatinine at 1.50 which appears to be patient's baseline and mild hypokalemia at 3.3. Patient is taking antihypertensive regimen of diltiazem 120 mg PO QD, Lasix 20 mg PO QD, metoprolol tartrate 50 mg PO BID, Klor-Co   Iron deficiency 08/19/2015   Ischemic cardiomyopathy    a. 08/2018 Echo: Ef 50-55%, mild apical anteroseptal and apical HK. Gr1 DD. Triv TR. PaSP 35mmHg.   NSTEMI (non-ST elevated myocardial infarction) ([place]) 09/04/2018   Pacemaker    PAF (paroxysmal atrial fibrillation) ([place])  Pain of right lower extremity 06/12/2017   Peripheral edema    polarity lead revision in both atrial and ventricular lead 11/17/2011   Recurrent upper respiratory infection (URI)    HX OF CHRONIC BRONCHITIS   Stage 3b chronic kidney disease (HCC) 08/19/2015   Last Assessment & Plan:  Formatting of this note might be different from the original. Stable at this time given creatinine 08/13/2020 was 1.50 which appears to be near patient's baseline. Last magnesium and phosphorous were checked on 08/13/2020 and were within normal limits. Last CBC was checked on  08/13/2020 and was normal. Last urine microalbumin to creatinine ratio was normal on 08/13/2020 -Conti   Statin intolerance 10/19/2018   Syncope    SYNCOPE, HX OF 03/17/2009   Qualifier: Diagnosis of  By: Graciela Husbands, MD, Susie Cassette    Vitamin D deficiency 08/12/2010    Past Surgical History:  Procedure Laterality Date   breast tumor     CORONARY STENT INTERVENTION N/A 09/07/2018   Procedure: CORONARY STENT INTERVENTION;  Surgeon: Tonny Bollman, MD;  [facility]: [facility] INVASIVE CV LAB;  Service: Cardiovascular;  Laterality: N/A;   INSERT / REPLACE / REMOVE PACEMAKER     St [name]   LEFT HEART CATH AND CORONARY ANGIOGRAPHY N/A 09/06/2018   Procedure: LEFT HEART CATH AND CORONARY ANGIOGRAPHY;  Surgeon: Tonny Bollman, MD;  Location: [facility] INVASIVE CV LAB;  Service: Cardiovascular;  Laterality: N/A;   PACEMAKER REVISION N/A 12/01/2011   Procedure: PACEMAKER REVISION;  Surgeon: [name], MD;  [facility] CATH LAB;  Service: Cardiovascular;  Laterality: N/A;   PPM GENERATOR CHANGEOUT N/A 01/12/2018   Procedure: PPM GENERATOR CHANGEOUT;  Surgeon: [name], MD;  Location: [facility] INVASIVE CV LAB;  Service: Cardiovascular;  Laterality: N/A;    Current Medications: Current Meds  Medication Sig   clopidogrel (PLAVIX) 75 MG tablet Take 1 tablet (75 mg total) by mouth daily with breakfast.   Coenzyme Q10 (COQ10) 100 MG CAPS Take 1 capsule by mouth daily.   Cyanocobalamin (VITAMIN B-12 IJ) Inject 1,000 mg as directed every 30 (thirty) days.    diltiazem (CARDIZEM CD) 120 MG 24 hr capsule TAKE 1 CAPSULE(120 MG) BY MOUTH DAILY (Patient taking differently: Take 120 mg by mouth daily.)   ferrous sulfate 325 (65 FE) MG tablet Take 325 mg by mouth daily with breakfast.   furosemide (LASIX) 20 MG tablet Take 20 mg by mouth as needed for edema.   hydrochlorothiazide (MICROZIDE) 12.5 MG capsule Take 12.5 mg by mouth daily.   metoprolol succinate (TOPROL-XL) 100 MG 24 hr tablet TAKE 1 AND 1/2 TABLETS(150 MG) BY  MOUTH DAILY WITH OR IMMEDIATELY FOLLOWING A MEAL (Patient taking differently: Take 150 mg by mouth in the morning.)   nitroGLYCERIN (NITROSTAT) 0.4 MG SL tablet Place 1 tablet (0.4 mg total) under the tongue every 5 (five) minutes x 3 doses as needed for chest pain.   omeprazole (PRILOSEC) 20 MG capsule Take 20 mg by mouth daily.    potassium chloride (KLOR-CON) 10 MEQ tablet Take 20 mEq by mouth in the morning.   rosuvastatin (CRESTOR) 5 MG tablet Take 5 mg by mouth daily.   Vitamin D, Ergocalciferol, (DRISDOL) 1.25 MG (50000 UT) CAPS capsule Take 50,000 Units by mouth every 7 (seven) days.   XARELTO 15 MG TABS tablet TAKE 1 TABLET BY MOUTH EVERY DAY (Patient taking differently: Take 15 mg by mouth daily with supper.)   [DISCONTINUED] rosuvastatin (CRESTOR) 5 MG tablet TAKE 1 TABLET(5 MG) BY  MOUTH 3 TIMES A WEEK (Patient taking differently: Take 5 mg by mouth daily.)     Allergies:   Atorvastatin, Crestor [rosuvastatin calcium], Niacin, Sulfa antibiotics, and Penicillins   Social History   Socioeconomic History   Marital status: Widowed    Spouse name: Not on file   Number of children: Not on file   Years of education: Not on file   Highest education level: Not on file  Occupational History   Not on file  Tobacco Use   Smoking status: Never   Smokeless tobacco: Never  Vaping Use   Vaping Use: Never used  Substance and Sexual Activity   Alcohol use: No   Drug use: No   Sexual activity: Never    Birth control/protection: Post-menopausal  Other Topics Concern   Not on file  Social History Narrative   ** Merged History Encounter **       Social Determinants of Health   Financial Resource Strain: Not on file  Food Insecurity: Not on file  Transportation Needs: Not on file  Physical Activity: Not on file  Stress: Not on file  Social Connections: Not on file     Family History: The patient's family history includes Heart disease in her father and mother. ROS:   Please see  the history of present illness.    All 14 point review of systems negative except as described per history of present illness  EKGs/Labs/Other Studies Reviewed:      Recent Labs: No results found for requested labs within last 8760 hours.  Recent Lipid Panel    Component Value Date/Time   CHOL 182 09/19/2019 0000   TRIG 174 (H) 09/19/2019 0000   HDL 41 09/19/2019 0000   CHOLHDL 4.4 09/19/2019 0000   CHOLHDL 6.2 09/05/2018 0048   VLDL 37 09/05/2018 0048   LDLCALC 110 (H) 09/19/2019 0000    Physical Exam:    VS:  BP 128/74 (BP Location: Left Arm, Patient Position: Sitting)    Pulse 91    Ht 5\' 6"  (1.676 m)    Wt 194 lb 9.6 oz (88.3 kg)    SpO2 94%    BMI 31.41 kg/m     Wt Readings from Last 3 Encounters:  08/27/21 194 lb 9.6 oz (88.3 kg)  07/12/21 195 lb (88.5 kg)  12/02/20 195 lb (88.5 kg)     GEN:  Well nourished, well developed in no acute distress HEENT: Normal NECK: No JVD; No carotid bruits LYMPHATICS: No lymphadenopathy CARDIAC: RRR, no murmurs, no rubs, no gallops RESPIRATORY:  Clear to auscultation without rales, wheezing or rhonchi  ABDOMEN: Soft, non-tender, non-distended MUSCULOSKELETAL:  No edema; No deformity  SKIN: Warm and dry LOWER EXTREMITIES: no swelling NEUROLOGIC:  Alert and oriented x 3 PSYCHIATRIC:  Normal affect   ASSESSMENT:    1. Coronary artery disease due to lipid rich plaque   2. PAF (paroxysmal atrial fibrillation) ([place])   3. Atrioventricular block, complete ([place])   4. Mixed hyperlipidemia   5. Pacemaker-St Judes    PLAN:    In order of problems listed above:  Coronary disease stable from that point review denies have any chest pain. Dyslipidemia she had intolerance to multiple statins however now she is on a slightly higher dose of statin which is Crestor every single day in few weeks she will have fasting lipid profile be done. Paroxysmal atrial fibrillation she is anticoagulant which I will continue, she denies having any  episode of palpitations but device reported atrial  fibrillation after frequency about 5%.  We will continue present management. Pacemaker present which I did review interrogation normal function continue present management   Medication Adjustments/Labs and Tests Ordered: Current medicines are reviewed at length with the patient today.  Concerns regarding medicines are outlined above.  No orders of the defined types were placed in this encounter.  Medication changes: No orders of the defined types were placed in this encounter.   Signed, Park Liter, MD, [facility] 08/27/2021 1:19 PM    [place]

## 2021-08-27 NOTE — Patient Instructions (Signed)
Medication Instructions:  Your physician recommends that you continue on your current medications as directed. Please refer to the Current Medication list given to you today.  *If you need a refill on your cardiac medications before your next appointment, please call your pharmacy*   Lab Work: [place] If you have labs (blood work) drawn today and your tests are completely normal, you will receive your results only by: MyChart Message (if you have MyChart) OR A paper copy in the mail If you have any lab test that is abnormal or we need to change your treatment, we will call you to review the results.   Testing/Procedures: None   Follow-Up: At [facility], you and your health needs are our priority.  As part of our continuing mission to provide you with exceptional heart care, we have created designated Provider Care Teams.  These Care Teams include your primary Cardiologist (physician) and Advanced Practice Providers (APPs -  Physician Assistants and Nurse Practitioners) who all work together to provide you with the care you need, when you need it.  We recommend signing up for the patient portal called "MyChart".  Sign up information is provided on this After Visit Summary.  MyChart is used to connect with patients for Virtual Visits (Telemedicine).  Patients are able to view lab/test results, encounter notes, upcoming appointments, etc.  Non-urgent messages can be sent to your provider as well.   To learn more about what you can do with MyChart, go to [email or web address].    Your next appointment:   6 month(s)  The format for your next appointment:   In Person  Provider:   Gypsy Balsam, MD    Other Instructions

## 2021-09-14 ENCOUNTER — Ambulatory Visit (INDEPENDENT_AMBULATORY_CARE_PROVIDER_SITE_OTHER): Payer: Medicare Other

## 2021-09-14 DIAGNOSIS — I442 Atrioventricular block, complete: Secondary | ICD-10-CM

## 2021-09-14 LAB — CUP PACEART REMOTE DEVICE CHECK
Battery Remaining Longevity: 65 mo
Battery Remaining Percentage: 60 %
Battery Voltage: 2.98 V
Brady Statistic AP VP Percent: 90 %
Brady Statistic AP VS Percent: 1 %
Brady Statistic AS VP Percent: 10 %
Brady Statistic AS VS Percent: 1 %
Brady Statistic RA Percent Paced: 84 %
Brady Statistic RV Percent Paced: 99 %
Date Time Interrogation Session: 20230103033649
Implantable Lead Implant Date: 20130321
Implantable Lead Implant Date: 20130321
Implantable Lead Location: 753859
Implantable Lead Location: 753860
Implantable Pulse Generator Implant Date: 20190503
Lead Channel Impedance Value: 330 Ohm
Lead Channel Impedance Value: 660 Ohm
Lead Channel Pacing Threshold Amplitude: 0.5 V
Lead Channel Pacing Threshold Amplitude: 0.75 V
Lead Channel Pacing Threshold Pulse Width: 0.4 ms
Lead Channel Pacing Threshold Pulse Width: 0.4 ms
Lead Channel Sensing Intrinsic Amplitude: 1.3 mV
Lead Channel Sensing Intrinsic Amplitude: 12 mV
Lead Channel Setting Pacing Amplitude: 0.75 V
Lead Channel Setting Pacing Amplitude: 2 V
Lead Channel Setting Pacing Pulse Width: 0.4 ms
Lead Channel Setting Sensing Sensitivity: 6 mV
Pulse Gen Model: 2272
Pulse Gen Serial Number: 9019281

## 2021-09-23 NOTE — Progress Notes (Signed)
Remote pacemaker transmission.   

## 2021-10-04 ENCOUNTER — Other Ambulatory Visit: Payer: Self-pay | Admitting: Cardiology

## 2021-10-04 DIAGNOSIS — M1611 Unilateral primary osteoarthritis, right hip: Secondary | ICD-10-CM | POA: Insufficient documentation

## 2021-12-07 ENCOUNTER — Telehealth: Payer: Self-pay | Admitting: Cardiology

## 2021-12-07 NOTE — Telephone Encounter (Signed)
Reviewed the patient's chart. ? ?09/07/18 she had a DES to the LAD. ?Per procedure notes from Dr. Excell Seltzer at that time: ?Successful complex PCI of the proximal/mid LAD (2.5x38 mm and 3.5x12 mm Resolute Onyx) and ramus intermedius (2.25x38 mm Resolute Onyx) ?  ?Recommend:  ?ASA 81 mg x 2-4 weeks as tolerated ?Plavix 75 mg x 6 months minimum ?Xarelto (reduce dose to 15 mg - AFib - PCI dose with concurrent antiplatelet) ?  ? ?Per the patient's chart, she is still on both plavix 75 mg once daily & Xarelto 15 mg once daily.  ?She was seen by Dr. Bing Matter on 08/27/21. ?To MD to review/ for further recommendations regarding ongoing use of plavix at this time.  ? ? ? ? ? ?

## 2021-12-07 NOTE — Telephone Encounter (Signed)
Pt c/o medication issue: ? ?1. Name of Medication:  ?clopidogrel (PLAVIX) 75 MG tablet ?XARELTO 15 MG TABS tablet ? ?2. How are you currently taking this medication (dosage and times per day)? Both daily as directed ? ?3. Are you having a reaction (difficulty breathing--STAT)?  ? ?4. What is your medication issue? Patient wants to make sure she should be taking both medications.  ?The pharmacist brought to her attention yesterday that both medications can be taken but usually its just for a short time after a heart attack ?

## 2021-12-08 NOTE — Telephone Encounter (Signed)
Called patient and informed her of Dr. Vanetta Shawl recommendation to continue taking eliquis, plavix and aspirin for one more month and then to stop taking the aspirin. Patient was agreeable to this plan and had no further questions.  ?

## 2021-12-08 NOTE — Telephone Encounter (Signed)
Eugene, Zeiders - 12/07/2021 11:33 AM ?Georgeanna Lea, MD  ?SentLucia Bitter December 08, 2021 11:22 AM  ?To: Jefferey Pica, RN  ? ? ?  ?   ? ?Message ? ?Since lesion was in proximal LAD I would recommend continue triple therapy for a month then we will most likely discontinue aspirin  ? ?

## 2021-12-14 ENCOUNTER — Ambulatory Visit (INDEPENDENT_AMBULATORY_CARE_PROVIDER_SITE_OTHER): Payer: Medicare Other

## 2021-12-14 DIAGNOSIS — I442 Atrioventricular block, complete: Secondary | ICD-10-CM

## 2021-12-14 LAB — CUP PACEART REMOTE DEVICE CHECK
Battery Remaining Longevity: 63 mo
Battery Remaining Percentage: 57 %
Battery Voltage: 2.98 V
Brady Statistic AP VP Percent: 90 %
Brady Statistic AP VS Percent: 1 %
Brady Statistic AS VP Percent: 9.7 %
Brady Statistic AS VS Percent: 1 %
Brady Statistic RA Percent Paced: 85 %
Brady Statistic RV Percent Paced: 99 %
Date Time Interrogation Session: 20230404031459
Implantable Lead Implant Date: 20130321
Implantable Lead Implant Date: 20130321
Implantable Lead Location: 753859
Implantable Lead Location: 753860
Implantable Pulse Generator Implant Date: 20190503
Lead Channel Impedance Value: 360 Ohm
Lead Channel Impedance Value: 660 Ohm
Lead Channel Pacing Threshold Amplitude: 0.5 V
Lead Channel Pacing Threshold Amplitude: 0.875 V
Lead Channel Pacing Threshold Pulse Width: 0.4 ms
Lead Channel Pacing Threshold Pulse Width: 0.4 ms
Lead Channel Sensing Intrinsic Amplitude: 1.4 mV
Lead Channel Sensing Intrinsic Amplitude: 12 mV
Lead Channel Setting Pacing Amplitude: 0.75 V
Lead Channel Setting Pacing Amplitude: 2 V
Lead Channel Setting Pacing Pulse Width: 0.4 ms
Lead Channel Setting Sensing Sensitivity: 6 mV
Pulse Gen Model: 2272
Pulse Gen Serial Number: 9019281

## 2021-12-29 NOTE — Progress Notes (Signed)
Remote pacemaker transmission.   

## 2022-01-03 ENCOUNTER — Telehealth: Payer: Self-pay | Admitting: Cardiology

## 2022-01-03 NOTE — Telephone Encounter (Signed)
Pt's stent was 08/2018 and started on Plavix. Xarelto was started 04/2021 for afib.  ?

## 2022-01-03 NOTE — Telephone Encounter (Signed)
New Message: ? ? ? ?Pharmacist called and said patient is taking Xarelto from Dr Matthew Folks and Plavix from another doctor. He wants to know if patient should be taking both of them?  ?

## 2022-01-04 NOTE — Addendum Note (Signed)
Addended by: Truddie Hidden on: 01/04/2022 03:09 PM ? ? Modules accepted: Orders ? ?

## 2022-01-04 NOTE — Telephone Encounter (Signed)
Recommendations reviewed with pt as per Dr. Krasowski's note.  Pt verbalized understanding and had no additional questions.  

## 2022-02-25 ENCOUNTER — Ambulatory Visit: Payer: Medicare Other | Admitting: Cardiology

## 2022-03-16 ENCOUNTER — Ambulatory Visit (INDEPENDENT_AMBULATORY_CARE_PROVIDER_SITE_OTHER): Payer: Medicare Other

## 2022-03-16 DIAGNOSIS — I442 Atrioventricular block, complete: Secondary | ICD-10-CM | POA: Diagnosis not present

## 2022-03-19 LAB — CUP PACEART REMOTE DEVICE CHECK
Battery Remaining Longevity: 60 mo
Battery Remaining Percentage: 54 %
Battery Voltage: 2.98 V
Brady Statistic AP VP Percent: 89 %
Brady Statistic AP VS Percent: 1 %
Brady Statistic AS VP Percent: 11 %
Brady Statistic AS VS Percent: 1 %
Brady Statistic RA Percent Paced: 84 %
Brady Statistic RV Percent Paced: 99 %
Date Time Interrogation Session: 20230704040009
Implantable Lead Implant Date: 20130321
Implantable Lead Implant Date: 20130321
Implantable Lead Location: 753859
Implantable Lead Location: 753860
Implantable Pulse Generator Implant Date: 20190503
Lead Channel Impedance Value: 360 Ohm
Lead Channel Impedance Value: 630 Ohm
Lead Channel Pacing Threshold Amplitude: 0.5 V
Lead Channel Pacing Threshold Amplitude: 0.75 V
Lead Channel Pacing Threshold Pulse Width: 0.4 ms
Lead Channel Pacing Threshold Pulse Width: 0.4 ms
Lead Channel Sensing Intrinsic Amplitude: 1 mV
Lead Channel Sensing Intrinsic Amplitude: 12 mV
Lead Channel Setting Pacing Amplitude: 0.75 V
Lead Channel Setting Pacing Amplitude: 2 V
Lead Channel Setting Pacing Pulse Width: 0.4 ms
Lead Channel Setting Sensing Sensitivity: 6 mV
Pulse Gen Model: 2272
Pulse Gen Serial Number: 9019281

## 2022-04-05 ENCOUNTER — Other Ambulatory Visit: Payer: Self-pay | Admitting: Cardiology

## 2022-04-06 NOTE — Progress Notes (Signed)
Remote pacemaker transmission.   

## 2022-04-21 DIAGNOSIS — R32 Unspecified urinary incontinence: Secondary | ICD-10-CM | POA: Insufficient documentation

## 2022-05-03 ENCOUNTER — Other Ambulatory Visit: Payer: Self-pay

## 2022-05-03 MED ORDER — RIVAROXABAN 15 MG PO TABS: 15.0000 mg | ORAL_TABLET | Freq: Every day | ORAL | 1 refills | Status: DC

## 2022-05-03 NOTE — Telephone Encounter (Signed)
Prescription refill request for Xarelto received.  Indication:A fib Last office visit:12/22 Weight:88.3 kg Age:88 Scr:1.6 CrCl:35.18 ml/min  Prescription refilled

## 2022-06-14 ENCOUNTER — Ambulatory Visit (INDEPENDENT_AMBULATORY_CARE_PROVIDER_SITE_OTHER): Payer: Medicare Other

## 2022-06-14 DIAGNOSIS — I442 Atrioventricular block, complete: Secondary | ICD-10-CM

## 2022-06-14 LAB — CUP PACEART REMOTE DEVICE CHECK
Battery Remaining Longevity: 57 mo
Battery Remaining Percentage: 52 %
Battery Voltage: 2.98 V
Brady Statistic AP VP Percent: 89 %
Brady Statistic AP VS Percent: 1 %
Brady Statistic AS VP Percent: 11 %
Brady Statistic AS VS Percent: 1 %
Brady Statistic RA Percent Paced: 84 %
Brady Statistic RV Percent Paced: 99 %
Date Time Interrogation Session: 20231003023859
Implantable Lead Implant Date: 20130321
Implantable Lead Implant Date: 20130321
Implantable Lead Location: 753859
Implantable Lead Location: 753860
Implantable Pulse Generator Implant Date: 20190503
Lead Channel Impedance Value: 350 Ohm
Lead Channel Impedance Value: 730 Ohm
Lead Channel Pacing Threshold Amplitude: 0.375 V
Lead Channel Pacing Threshold Amplitude: 0.75 V
Lead Channel Pacing Threshold Pulse Width: 0.4 ms
Lead Channel Pacing Threshold Pulse Width: 0.4 ms
Lead Channel Sensing Intrinsic Amplitude: 1.3 mV
Lead Channel Sensing Intrinsic Amplitude: 12 mV
Lead Channel Setting Pacing Amplitude: 0.625
Lead Channel Setting Pacing Amplitude: 2 V
Lead Channel Setting Pacing Pulse Width: 0.4 ms
Lead Channel Setting Sensing Sensitivity: 6 mV
Pulse Gen Model: 2272
Pulse Gen Serial Number: 9019281

## 2022-06-17 ENCOUNTER — Ambulatory Visit: Payer: Medicare Other | Attending: Cardiology | Admitting: Cardiology

## 2022-06-17 ENCOUNTER — Encounter: Payer: Self-pay | Admitting: Cardiology

## 2022-06-17 VITALS — BP 138/68 | HR 72 | Ht 66.0 in | Wt 194.6 lb

## 2022-06-17 DIAGNOSIS — I255 Ischemic cardiomyopathy: Secondary | ICD-10-CM

## 2022-06-17 DIAGNOSIS — I2510 Atherosclerotic heart disease of native coronary artery without angina pectoris: Secondary | ICD-10-CM | POA: Diagnosis not present

## 2022-06-17 DIAGNOSIS — I480 Paroxysmal atrial fibrillation: Secondary | ICD-10-CM

## 2022-06-17 DIAGNOSIS — Z950 Presence of cardiac pacemaker: Secondary | ICD-10-CM | POA: Diagnosis not present

## 2022-06-17 DIAGNOSIS — I2583 Coronary atherosclerosis due to lipid rich plaque: Secondary | ICD-10-CM

## 2022-06-17 DIAGNOSIS — Z789 Other specified health status: Secondary | ICD-10-CM

## 2022-06-17 DIAGNOSIS — R0609 Other forms of dyspnea: Secondary | ICD-10-CM

## 2022-06-17 NOTE — Patient Instructions (Addendum)
Medication Instructions:  Your physician has recommended you make the following change in your medication:  Nitroglycerin Use nitroglycerin 1 tablet placed under the tongue at the first sign of chest pain or an angina attack. 1 tablet may be used every 5 minutes as needed, for up to 15 minutes. Do not take more than 3 tablets in 15 minutes. If pain persist call 911 or go to the nearest ED.      Lab Work: Fasting Lipid panel- today If you have labs (blood work) drawn today and your tests are completely normal, you will receive your results only by: MyChart Message (if you have MyChart) OR A paper copy in the mail If you have any lab test that is abnormal or we need to change your treatment, we will call you to review the results.   Testing/Procedures: Your physician has requested that you have an echocardiogram. Echocardiography is a painless test that uses sound waves to create images of your heart. It provides your doctor with information about the size and shape of your heart and how well your heart's chambers and valves are working. This procedure takes approximately one hour. There are no restrictions for this procedure.    Follow-Up: At [facility], you and your health needs are our priority.  As part of our continuing mission to provide you with exceptional heart care, we have created designated Provider Care Teams.  These Care Teams include your primary Cardiologist (physician) and Advanced Practice Providers (APPs -  Physician Assistants and Nurse Practitioners) who all work together to provide you with the care you need, when you need it.  We recommend signing up for the patient portal called "MyChart".  Sign up information is provided on this After Visit Summary.  MyChart is used to connect with patients for Virtual Visits (Telemedicine).  Patients are able to view lab/test results, encounter notes, upcoming appointments, etc.  Non-urgent messages can be sent to your provider as  well.   To learn more about what you can do with MyChart, go to [email or web address].    Your next appointment:   6 month(s)  The format for your next appointment:   In Person  Provider:   Jenne Campus, MD    Other Instructions NA

## 2022-06-17 NOTE — Progress Notes (Signed)
Cardiology Office Note:    Date:  06/17/2022   ID:  Judith Flores, DOB Apr 01, 1934, MRN [id number]  PCP:  Jacklynn Ganong, MD  Cardiologist:  Jenne Campus, MD    Referring MD: Jacklynn Ganong, MD   No chief complaint on file. I am weak and tired  History of Present Illness:    Judith Flores is a 88 y.o. female  with past medical history significant for coronary artery disease.  In 2019 she required stenting of the mid LAD with drug-eluting stent at that time she was also find to have completely occluded small diagonal branch.  Additional past medical history includes dyslipidemia with some difficulty tolerating statin, she does have a pacemaker secondary to IV block which was Abbott device. Comes today to my office for follow-up she complain of being weak tired exhausted still trying to be active and have to admit for somebody who is 88 years old now 73 she is doing quite well.  Past Medical History:  Diagnosis Date   Acute bronchitis 01/17/2011   Acute cystitis with hematuria 01/17/2011   Last Assessment & Plan:  Formatting of this note might be different from the original. Patient presents with history and physical exam as documented low that is most consistent with likely acute cystitis with hematuria. Unlikely to be vaginal source per patient and as such do not suspect that this should remain on the differential. Unlikely that this represents underlying malignancy with her point   Acute URI 08/19/2015   Anemia    Asteatotic dermatitis 10/16/2017   Atrial fibrillation ([place]) 02/16/2012   Atrioventricular block, complete ([place]) 11/17/2011   B12 deficiency 08/10/2016   Back pain 03/27/2014   Bilateral lower extremity edema 02/07/2017   CAD (coronary artery disease)    a. 08/2018 NSTEMI/PCI: LM nl, LAD 95p/m (3.5 x 12 mm resolute Onyx DES and 2.5 x 38 mm resolute Onyx DES successfully placed), D1 100, D2 40, RI 95 (2.25 x 38 mm resolute Onyx DES), LCx 30p, RCA 20m 40d.   CKD (chronic kidney  disease), stage III ([place]) 09/04/2018   Complete heart block (HCC)    intermittent   Depression screening 11/13/2013   Overview:  PHQ2 Done 11/13/13   Elevated troponin 09/04/2018   Epigastric pain 09/04/2018   GERD (gastroesophageal reflux disease)    HOH (hard of hearing)    HTN (hypertension)    Hyperlipidemia 08/12/2010   Hypertension 02/16/2012   Hypertension, benign 08/12/2010   Last Assessment & Plan:  Formatting of this note might be different from the original. Controlled based on today's blood pressure of 120/74. Last BMP was checked 08/13/2020 and showed creatinine at 1.50 which appears to be patient's baseline and mild hypokalemia at 3.3. Patient is taking antihypertensive regimen of diltiazem 120 mg PO QD, Lasix 20 mg PO QD, metoprolol tartrate 50 mg PO BID, Klor-Co   Iron deficiency 08/19/2015   Ischemic cardiomyopathy    a. 08/2018 Echo: Ef 50-55%, mild apical anteroseptal and apical HK. Gr1 DD. Triv TR. PaSP 24mmHg.   NSTEMI (non-ST elevated myocardial infarction) ([place]) 09/04/2018   Pacemaker    PAF (paroxysmal atrial fibrillation) (HCC)    Pain of right lower extremity 06/12/2017   Peripheral edema    polarity lead revision in both atrial and ventricular lead 11/17/2011   Recurrent upper respiratory infection (URI)    HX OF CHRONIC BRONCHITIS   Stage 3b chronic kidney disease ([place]) 08/19/2015   Last Assessment & Plan:  Formatting of this note  might be different from the original. Stable at this time given creatinine 08/13/2020 was 1.50 which appears to be near patient's baseline. Last magnesium and phosphorous were checked on 08/13/2020 and were within normal limits. Last CBC was checked on 08/13/2020 and was normal. Last urine microalbumin to creatinine ratio was normal on 08/13/2020 -Conti   Statin intolerance 10/19/2018   Syncope    SYNCOPE, HX OF 03/17/2009   Qualifier: Diagnosis of  By: Caryl Comes, MD, Remus Blake    Vitamin D deficiency 08/12/2010    Past Surgical History:   Procedure Laterality Date   breast tumor     CORONARY STENT INTERVENTION N/A 09/07/2018   Procedure: CORONARY STENT INTERVENTION;  Surgeon: Sherren Mocha, MD;  Location: [facility];  Service: Cardiovascular;  Laterality: N/A;   INSERT / REPLACE / REMOVE PACEMAKER     [employer] [name]   LEFT HEART CATH AND CORONARY ANGIOGRAPHY N/A 09/06/2018   Procedure: LEFT HEART CATH AND CORONARY ANGIOGRAPHY;  Surgeon: Sherren Mocha, MD;  Location: [facility];  Service: Cardiovascular;  Laterality: N/A;   PACEMAKER REVISION N/A 12/01/2011   Procedure: PACEMAKER REVISION;  Surgeon: Deboraha Sprang, MD;  Location: [facility];  Service: Cardiovascular;  Laterality: N/A;   PPM GENERATOR CHANGEOUT N/A 01/12/2018   Procedure: PPM GENERATOR CHANGEOUT;  Surgeon: Deboraha Sprang, MD;  Location: [facility];  Service: Cardiovascular;  Laterality: N/A;    Current Medications: Current Meds  Medication Sig   clopidogrel (PLAVIX) 75 MG tablet Take 75 mg by mouth daily.   Coenzyme Q10 (COQ10) 100 MG CAPS Take 1 capsule by mouth daily.   Cyanocobalamin (VITAMIN B-12 IJ) Inject 1,000 mg as directed every 30 (thirty) days.    diltiazem (CARDIZEM CD) 120 MG 24 hr capsule TAKE 1 CAPSULE(120 MG) BY MOUTH DAILY   ferrous sulfate 325 (65 FE) MG tablet Take 325 mg by mouth daily with breakfast.   furosemide (LASIX) 20 MG tablet Take 20 mg by mouth as needed for edema.   metoprolol succinate (TOPROL-XL) 100 MG 24 hr tablet TAKE 1 AND 1/2 TABLETS(150 MG) BY MOUTH DAILY WITH OR IMMEDIATELY FOLLOWING A MEAL   nitroGLYCERIN (NITROSTAT) 0.4 MG SL tablet Place 1 tablet (0.4 mg total) under the tongue every 5 (five) minutes x 3 doses as needed for chest pain.   omeprazole (PRILOSEC) 20 MG capsule Take 20 mg by mouth daily.    potassium chloride (KLOR-CON) 10 MEQ tablet Take 20 mEq by mouth in the morning.   Rivaroxaban (XARELTO) 15 MG TABS tablet Take 1 tablet (15 mg total) by mouth daily.   rosuvastatin (CRESTOR) 5  MG tablet Take 5 mg by mouth daily.   Vitamin D, Ergocalciferol, (DRISDOL) 1.25 MG (50000 UT) CAPS capsule Take 50,000 Units by mouth every 7 (seven) days.     Allergies:   Atorvastatin, Crestor [rosuvastatin calcium], Niacin, Sulfa antibiotics, and Penicillins   Social History   Socioeconomic History   Marital status: Widowed    Spouse name: Not on file   Number of children: Not on file   Years of education: Not on file   Highest education level: Not on file  Occupational History   Not on file  Tobacco Use   Smoking status: Never   Smokeless tobacco: Never  Vaping Use   Vaping Use: Never used  Substance and Sexual Activity   Alcohol use: No   Drug use: No   Sexual activity: Never    Birth control/protection:  Post-menopausal  Other Topics Concern   Not on file  Social History Narrative   ** Merged History Encounter **       Social Determinants of Health   Financial Resource Strain: Not on file  Food Insecurity: Unknown (09/05/2018)   Hunger Vital Sign    Worried About Running Out of Food in the Last Year: Patient refused    [place] in the Last Year: Patient refused  Transportation Needs: Unknown (09/05/2018)   PRAPARE - Transportation    Lack of Transportation (Medical): Patient refused    Lack of Transportation (Non-Medical): Patient refused  Physical Activity: Unknown (09/05/2018)   Exercise Vital Sign    Days of Exercise per Week: Patient refused    Minutes of Exercise per Session: Patient refused  Stress: Not on file  Social Connections: Unknown (09/05/2018)   Social Connection and Isolation Panel [NHANES]    Frequency of Communication with Friends and Family: Patient refused    Frequency of Social Gatherings with Friends and Family: Patient refused    Attends Religious Services: Patient refused    Marine scientist or Organizations: Patient refused    Attends Archivist Meetings: Patient refused    Marital Status: Patient refused      Family History: The patient's family history includes Heart disease in her father and mother. ROS:   Please see the history of present illness.    All 14 point review of systems negative except as described per history of present illness  EKGs/Labs/Other Studies Reviewed:      Recent Labs: No results found for requested labs within last 365 days.  Recent Lipid Panel    Component Value Date/Time   CHOL 182 09/19/2019 0000   TRIG 174 (H) 09/19/2019 0000   HDL 41 09/19/2019 0000   CHOLHDL 4.4 09/19/2019 0000   CHOLHDL 6.2 09/05/2018 0048   VLDL 37 09/05/2018 0048   LDLCALC 110 (H) 09/19/2019 0000    Physical Exam:    VS:  BP 138/68 (BP Location: Left Arm, Patient Position: Sitting)   Pulse 72   Ht 5\' 6"  (1.676 m)   Wt 194 lb 9.6 oz (88.3 kg)   SpO2 97%   BMI 31.41 kg/m     Wt Readings from Last 3 Encounters:  06/17/22 194 lb 9.6 oz (88.3 kg)  08/27/21 194 lb 9.6 oz (88.3 kg)  07/12/21 195 lb (88.5 kg)     GEN:  Well nourished, well developed in no acute distress HEENT: Normal NECK: No JVD; No carotid bruits LYMPHATICS: No lymphadenopathy CARDIAC: RRR, no murmurs, no rubs, no gallops RESPIRATORY:  Clear to auscultation without rales, wheezing or rhonchi  ABDOMEN: Soft, non-tender, non-distended MUSCULOSKELETAL:  No edema; No deformity  SKIN: Warm and dry LOWER EXTREMITIES: no swelling NEUROLOGIC:  Alert and oriented x 3 PSYCHIATRIC:  Normal affect   ASSESSMENT:    1. Coronary artery disease due to lipid rich plaque   2. PAF (paroxysmal atrial fibrillation) ([place])   3. Ischemic cardiomyopathy   4. Pacemaker-[employer] Judes   5. Statin intolerance    PLAN:    In order of problems listed above:  Coronary disease stable from that point review again we had a long discussion about continuation of Plavix.  She is tolerating this quite well with no difficulties we have difficulty modify her risk factors namely dyslipidemia therefore at least for now we decided to  continue with Xarelto as well as Plavix Pulm assessment atrial fibrillation denies having  any palpitations.  Anticoagulated which I continue Ischemic cardiomyopathy, echocardiogram will be done to assess left ventricle ejection fraction.  He remains on continue present management. Central pacemaker I did review interrogation normal function .  Intolerance I did review K PN however the numbers I have is from 2021 with LDL 110 HDL 41 we will recheck her fasting lipid profile.   Medication Adjustments/Labs and Tests Ordered: Current medicines are reviewed at length with the patient today.  Concerns regarding medicines are outlined above.  No orders of the defined types were placed in this encounter.  Medication changes: No orders of the defined types were placed in this encounter.   Signed, Park Liter, MD, [facility] 06/17/2022 11:52 AM    [place]

## 2022-06-18 LAB — LIPID PANEL
Chol/HDL Ratio: 4.6 ratio — ABNORMAL HIGH (ref 0.0–4.4)
Cholesterol, Total: 179 mg/dL (ref 100–199)
HDL: 39 mg/dL — ABNORMAL LOW (ref >39)
LDL Chol Calc (NIH): 100 mg/dL — ABNORMAL HIGH (ref 0–99)
Triglycerides: 236 mg/dL — ABNORMAL HIGH (ref 0–149)
VLDL Cholesterol Cal: 40 mg/dL (ref 5–40)

## 2022-06-27 NOTE — Progress Notes (Signed)
Remote pacemaker transmission.   

## 2022-06-29 ENCOUNTER — Other Ambulatory Visit: Payer: Self-pay | Admitting: Cardiology

## 2022-07-01 ENCOUNTER — Telehealth: Payer: Self-pay

## 2022-07-01 DIAGNOSIS — E782 Mixed hyperlipidemia: Secondary | ICD-10-CM

## 2022-07-01 MED ORDER — ROSUVASTATIN CALCIUM 10 MG PO TABS: 10.0000 mg | ORAL_TABLET | Freq: Every day | ORAL | 0 refills | Status: DC

## 2022-07-01 NOTE — Telephone Encounter (Signed)
-----   Message from Park Liter, MD sent at 06/21/2022 10:03 AM EDT ----- Cholesterol still elevated, I recommend to increase rosuvastatin to 10 mg daily, fasting lipid profile, AST LT 6 weeks

## 2022-07-01 NOTE — Telephone Encounter (Signed)
Patient notified f results and recommendations and agreed with plan. Rx sent and lab order on file. Aware to fast for blood work.

## 2022-07-04 ENCOUNTER — Other Ambulatory Visit: Payer: Self-pay | Admitting: Cardiology

## 2022-07-06 ENCOUNTER — Ambulatory Visit: Payer: Medicare Other | Attending: Cardiology

## 2022-07-06 DIAGNOSIS — R0609 Other forms of dyspnea: Secondary | ICD-10-CM | POA: Diagnosis not present

## 2022-07-06 LAB — ECHOCARDIOGRAM COMPLETE
Area-P 1/2: 3.95 cm2
Calc EF: 55.4 %
S' Lateral: 2.7 cm
Single Plane A2C EF: 52.6 %
Single Plane A4C EF: 58.1 %

## 2022-07-12 ENCOUNTER — Telehealth: Payer: Self-pay

## 2022-07-12 NOTE — Telephone Encounter (Signed)
Patient notified of results.

## 2022-07-12 NOTE — Telephone Encounter (Signed)
-----   Message from Park Liter, MD sent at 07/07/2022  6:11 PM EDT ----- Echocardiogram showed preserved left ventricle ejection fraction, mild mitral valve regurgitation overall looks good

## 2022-08-03 ENCOUNTER — Other Ambulatory Visit: Payer: Self-pay

## 2022-08-03 ENCOUNTER — Telehealth: Payer: Self-pay | Admitting: Cardiology

## 2022-08-03 NOTE — Telephone Encounter (Signed)
Pt c/o BP issue: STAT if pt c/o blurred vision, one-sided weakness or slurred speech  1. What are your last 5 BP readings?  186/98 - This morning  2. Are you having any other symptoms (ex. Dizziness, headache, blurred vision, passed out)? Dizziness, weak, passed out (Monday), and vomiting  3. What is your BP issue? Pt states that BP has been elevated since Monday. Pt would like a callback regarding this matter

## 2022-08-03 NOTE — Telephone Encounter (Signed)
Called the patient and informed her of Dr. Vanetta Shawl recommendation below:  "I do not think increasing dose of cholesterol medication is responsible for it but she can try to cut down to previous dose for about few days and see if it make her feel any better"  Patient was agreeable with this plan and had no further questions at this time.

## 2022-08-03 NOTE — Telephone Encounter (Signed)
Called patient and she reported that for the past two days she has been having some acid reflux and when she gets up to walk she feels light headed and dizzy and feels like she will pass out. This past Monday she called 911 and per the patient they told her the monitor looked good and her vitals were stable and she did not want to go to the ER. Today she woke up and her blood pressure was 186/98. She took her blood pressure medication and her last blood pressure was 158/78. She also states that her Crestor was increased from 5 mg to 10 mg and was questioning if that could have something to do with the way she is feeling. Please advise.

## 2022-08-19 DIAGNOSIS — I480 Paroxysmal atrial fibrillation: Secondary | ICD-10-CM | POA: Diagnosis not present

## 2022-08-19 DIAGNOSIS — I2510 Atherosclerotic heart disease of native coronary artery without angina pectoris: Secondary | ICD-10-CM | POA: Diagnosis not present

## 2022-08-19 DIAGNOSIS — R079 Chest pain, unspecified: Secondary | ICD-10-CM | POA: Diagnosis not present

## 2022-08-19 DIAGNOSIS — I10 Essential (primary) hypertension: Secondary | ICD-10-CM | POA: Diagnosis not present

## 2022-08-19 DIAGNOSIS — Z7901 Long term (current) use of anticoagulants: Secondary | ICD-10-CM | POA: Diagnosis not present

## 2022-09-13 ENCOUNTER — Ambulatory Visit (INDEPENDENT_AMBULATORY_CARE_PROVIDER_SITE_OTHER): Payer: Medicare Other

## 2022-09-13 DIAGNOSIS — I442 Atrioventricular block, complete: Secondary | ICD-10-CM | POA: Diagnosis not present

## 2022-09-13 LAB — CUP PACEART REMOTE DEVICE CHECK
Battery Remaining Longevity: 54 mo
Battery Remaining Percentage: 49 %
Battery Voltage: 2.96 V
Brady Statistic AP VP Percent: 89 %
Brady Statistic AP VS Percent: 1 %
Brady Statistic AS VP Percent: 11 %
Brady Statistic AS VS Percent: 1 %
Brady Statistic RA Percent Paced: 84 %
Brady Statistic RV Percent Paced: 99 %
Date Time Interrogation Session: 20240102020033
Implantable Lead Connection Status: 753985
Implantable Lead Connection Status: 753985
Implantable Lead Implant Date: 20130321
Implantable Lead Implant Date: 20130321
Implantable Lead Location: 753859
Implantable Lead Location: 753860
Implantable Pulse Generator Implant Date: 20190503
Lead Channel Impedance Value: 360 Ohm
Lead Channel Impedance Value: 660 Ohm
Lead Channel Pacing Threshold Amplitude: 0.375 V
Lead Channel Pacing Threshold Amplitude: 0.75 V
Lead Channel Pacing Threshold Pulse Width: 0.4 ms
Lead Channel Pacing Threshold Pulse Width: 0.4 ms
Lead Channel Sensing Intrinsic Amplitude: 1 mV
Lead Channel Sensing Intrinsic Amplitude: 11.5 mV
Lead Channel Setting Pacing Amplitude: 0.625
Lead Channel Setting Pacing Amplitude: 2 V
Lead Channel Setting Pacing Pulse Width: 0.4 ms
Lead Channel Setting Sensing Sensitivity: 6 mV
Pulse Gen Model: 2272
Pulse Gen Serial Number: 9019281

## 2022-10-08 ENCOUNTER — Other Ambulatory Visit: Payer: Self-pay | Admitting: Cardiology

## 2022-10-13 NOTE — Progress Notes (Signed)
Remote pacemaker transmission.   

## 2022-10-17 ENCOUNTER — Encounter: Payer: Self-pay | Admitting: Cardiology

## 2022-10-17 ENCOUNTER — Ambulatory Visit: Payer: Medicare Other | Attending: Cardiology | Admitting: Cardiology

## 2022-10-17 VITALS — BP 112/66 | HR 72 | Ht 66.0 in | Wt 188.8 lb

## 2022-10-17 DIAGNOSIS — D6869 Other thrombophilia: Secondary | ICD-10-CM

## 2022-10-17 DIAGNOSIS — I480 Paroxysmal atrial fibrillation: Secondary | ICD-10-CM

## 2022-10-17 DIAGNOSIS — I442 Atrioventricular block, complete: Secondary | ICD-10-CM

## 2022-10-17 DIAGNOSIS — I2510 Atherosclerotic heart disease of native coronary artery without angina pectoris: Secondary | ICD-10-CM | POA: Diagnosis not present

## 2022-10-17 LAB — CUP PACEART INCLINIC DEVICE CHECK
Battery Remaining Longevity: 51 mo
Battery Voltage: 2.96 V
Brady Statistic RA Percent Paced: 84 %
Brady Statistic RV Percent Paced: 99.93 %
Date Time Interrogation Session: 20240205165902
Implantable Lead Connection Status: 753985
Implantable Lead Connection Status: 753985
Implantable Lead Implant Date: 20130321
Implantable Lead Implant Date: 20130321
Implantable Lead Location: 753859
Implantable Lead Location: 753860
Implantable Pulse Generator Implant Date: 20190503
Lead Channel Impedance Value: 375 Ohm
Lead Channel Impedance Value: 725 Ohm
Lead Channel Pacing Threshold Amplitude: 0.5 V
Lead Channel Pacing Threshold Amplitude: 0.5 V
Lead Channel Pacing Threshold Amplitude: 0.75 V
Lead Channel Pacing Threshold Amplitude: 0.75 V
Lead Channel Pacing Threshold Pulse Width: 0.4 ms
Lead Channel Pacing Threshold Pulse Width: 0.4 ms
Lead Channel Pacing Threshold Pulse Width: 0.4 ms
Lead Channel Pacing Threshold Pulse Width: 0.4 ms
Lead Channel Sensing Intrinsic Amplitude: 0 mV
Lead Channel Sensing Intrinsic Amplitude: 1.4 mV
Lead Channel Sensing Intrinsic Amplitude: 11.5 mV
Lead Channel Setting Pacing Amplitude: 0.625
Lead Channel Setting Pacing Amplitude: 2 V
Lead Channel Setting Pacing Pulse Width: 0.4 ms
Lead Channel Setting Sensing Sensitivity: 6 mV
Pulse Gen Model: 2272
Pulse Gen Serial Number: 9019281

## 2022-10-17 NOTE — Patient Instructions (Signed)
Medication Instructions:  Your physician recommends that you continue on your current medications as directed. Please refer to the Current Medication list given to you today.  *If you need a refill on your cardiac medications before your next appointment, please call your pharmacy*   Lab Work: None ordered   Testing/Procedures: None ordered   Follow-Up: At [facility], you and your health needs are our priority.  As part of our continuing mission to provide you with exceptional heart care, we have created designated Provider Care Teams.  These Care Teams include your primary Cardiologist (physician) and Advanced Practice Providers (APPs -  Physician Assistants and Nurse Practitioners) who all work together to provide you with the care you need, when you need it.  Remote monitoring is used to monitor your Pacemaker or ICD from home. This monitoring reduces the number of office visits required to check your device to one time per year. It allows [place] to keep an eye on the functioning of your device to ensure it is working properly. You are scheduled for a device check from home on 12/13/2022. You may send your transmission at any time that day. If you have a wireless device, the transmission will be sent automatically. After your physician reviews your transmission, you will receive a postcard with your next transmission date.  Make your follow up appointment with Dr. Agustin Cree for March/April.  You can discuss cholesterol control with him at that appointment.   Your next appointment:   1 year(s)  The format for your next appointment:   In Person  Provider:   Allegra Lai, MD    Thank you for choosing [facility]!!   Trinidad Curet, RN [phone]    Other Instructions

## 2022-10-17 NOTE — Progress Notes (Addendum)
Electrophysiology Office Note   Date:  10/17/2022   ID:  Judith Flores, Judith Flores Jul 29, 1934, MRN [id number]  PCP:  Jacklynn Ganong, MD  Cardiologist:  Agustin Cree Primary Electrophysiologist:  Almedia Cordell Meredith Leeds, MD    No chief complaint on file.     History of Present Illness: Judith Flores is a 89 y.o. female who is being seen today for the evaluation of pacemaker at the request of Jacklynn Ganong, MD. Presenting today for electrophysiology evaluation.  She has a history seen for coronary artery disease post LAD stent December 2019 after non-STEMI, hypertension, hyperlipidemia, complete heart block, atrial fibrillation.  She is post Public house manager.  Today, denies symptoms of palpitations, chest pain, shortness of breath, orthopnea, PND, lower extremity edema, claudication, dizziness, presyncope, syncope, bleeding, or neurologic sequela. The patient is tolerating medications without difficulties.  Complaint is leg pain.  She feels that her leg pain is due to her statin.  She is tried both atorvastatin and rosuvastatin with similar symptoms.  Aside from that, she has no major complaints.  She is quite happy with her pacemaker.   Past Medical History:  Diagnosis Date   Acute bronchitis 01/17/2011   Acute cystitis with hematuria 01/17/2011   Last Assessment & Plan:  Formatting of this note might be different from the original. Patient presents with history and physical exam as documented low that is most consistent with likely acute cystitis with hematuria. Unlikely to be vaginal source per patient and as such do not suspect that this should remain on the differential. Unlikely that this represents underlying malignancy with her point   Acute URI 08/19/2015   Anemia    Asteatotic dermatitis 10/16/2017   Atrial fibrillation ([place]) 02/16/2012   Atrioventricular block, complete ([place]) 11/17/2011   B12 deficiency 08/10/2016   Back pain 03/27/2014   Bilateral lower extremity edema 02/07/2017    CAD (coronary artery disease)    a. 08/2018 NSTEMI/PCI: LM nl, LAD 95p/m (3.5 x 12 mm resolute Onyx DES and 2.5 x 38 mm resolute Onyx DES successfully placed), D1 100, D2 40, RI 95 (2.25 x 38 mm resolute Onyx DES), LCx 30p, RCA 10m 40d.   CKD (chronic kidney disease), stage III ([place]) 09/04/2018   Complete heart block (HCC)    intermittent   Depression screening 11/13/2013   Overview:  PHQ2 Done 11/13/13   Elevated troponin 09/04/2018   Epigastric pain 09/04/2018   GERD (gastroesophageal reflux disease)    HOH (hard of hearing)    HTN (hypertension)    Hyperlipidemia 08/12/2010   Hypertension 02/16/2012   Hypertension, benign 08/12/2010   Last Assessment & Plan:  Formatting of this note might be different from the original. Controlled based on today's blood pressure of 120/74. Last BMP was checked 08/13/2020 and showed creatinine at 1.50 which appears to be patient's baseline and mild hypokalemia at 3.3. Patient is taking antihypertensive regimen of diltiazem 120 mg PO QD, Lasix 20 mg PO QD, metoprolol tartrate 50 mg PO BID, Klor-Co   Iron deficiency 08/19/2015   Ischemic cardiomyopathy    a. 08/2018 Echo: Ef 50-55%, mild apical anteroseptal and apical HK. Gr1 DD. Triv TR. PaSP 54mmHg.   NSTEMI (non-ST elevated myocardial infarction) ([place]) 09/04/2018   Pacemaker    PAF (paroxysmal atrial fibrillation) (HCC)    Pain of right lower extremity 06/12/2017   Peripheral edema    polarity lead revision in both atrial and ventricular lead 11/17/2011   Recurrent upper respiratory infection (URI)  HX OF CHRONIC BRONCHITIS   Stage 3b chronic kidney disease ([place]) 08/19/2015   Last Assessment & Plan:  Formatting of this note might be different from the original. Stable at this time given creatinine 08/13/2020 was 1.50 which appears to be near patient's baseline. Last magnesium and phosphorous were checked on 08/13/2020 and were within normal limits. Last CBC was checked on 08/13/2020 and was normal. Last urine  microalbumin to creatinine ratio was normal on 08/13/2020 -Conti   Statin intolerance 10/19/2018   Syncope    SYNCOPE, HX OF 03/17/2009   Qualifier: Diagnosis of  By: Caryl Comes, MD, Remus Blake    Vitamin D deficiency 08/12/2010   Past Surgical History:  Procedure Laterality Date   breast tumor     CORONARY STENT INTERVENTION N/A 09/07/2018   Procedure: CORONARY STENT INTERVENTION;  Surgeon: Sherren Mocha, MD;  Location: [facility];  Service: Cardiovascular;  Laterality: N/A;   INSERT / REPLACE / REMOVE PACEMAKER     St [name]   LEFT HEART CATH AND CORONARY ANGIOGRAPHY N/A 09/06/2018   Procedure: LEFT HEART CATH AND CORONARY ANGIOGRAPHY;  Surgeon: Sherren Mocha, MD;  Location: [place] [facility];  Service: Cardiovascular;  Laterality: N/A;   PACEMAKER REVISION N/A 12/01/2011   Procedure: PACEMAKER REVISION;  Surgeon: Deboraha Sprang, MD;  Location: [facility] CATH LAB;  Service: Cardiovascular;  Laterality: N/A;   PPM GENERATOR CHANGEOUT N/A 01/12/2018   Procedure: PPM GENERATOR CHANGEOUT;  Surgeon: Deboraha Sprang, MD;  Location: [place] [facility];  Service: Cardiovascular;  Laterality: N/A;     Current Outpatient Medications  Medication Sig Dispense Refill   clopidogrel (PLAVIX) 75 MG tablet Take 75 mg by mouth daily.     Coenzyme Q10 (COQ10) 100 MG CAPS Take 1 capsule by mouth daily.     Cyanocobalamin (VITAMIN B-12 IJ) Inject 1,000 mg as directed every 30 (thirty) days.      diltiazem (CARDIZEM CD) 120 MG 24 hr capsule TAKE 1 CAPSULE(120 MG) BY MOUTH DAILY 90 capsule 2   ferrous sulfate 325 (65 FE) MG tablet Take 325 mg by mouth daily with breakfast.     furosemide (LASIX) 20 MG tablet Take 20 mg by mouth as needed for edema.     hydrochlorothiazide (MICROZIDE) 12.5 MG capsule Take 1 capsule by mouth daily.     metoprolol succinate (TOPROL-XL) 100 MG 24 hr tablet Take 1.5 tablets (150 mg total) by mouth daily. 135 tablet 3   nitroGLYCERIN (NITROSTAT) 0.4 MG SL tablet Place 1 tablet  (0.4 mg total) under the tongue every 5 (five) minutes x 3 doses as needed for chest pain. 25 tablet 3   omeprazole (PRILOSEC) 20 MG capsule Take 20 mg by mouth daily.      potassium chloride (KLOR-CON) 10 MEQ tablet Take 20 mEq by mouth in the morning.     Rivaroxaban (XARELTO) 15 MG TABS tablet Take 1 tablet (15 mg total) by mouth daily. 90 tablet 1   Vitamin D, Ergocalciferol, (DRISDOL) 1.25 MG (50000 UT) CAPS capsule Take 50,000 Units by mouth every 7 (seven) days.     No current facility-administered medications for this visit.    Allergies:   Atorvastatin, Crestor [rosuvastatin calcium], Niacin, Sulfa antibiotics, and Penicillins   Social History:  The patient  reports that she has never smoked. She has never used smokeless tobacco. She reports that she does not drink alcohol and does not use drugs.   Family History:  The patient's family history includes  Heart disease in her father and mother.   ROS:  Please see the history of present illness.   Otherwise, review of systems is positive for none.   All other systems are reviewed and negative.   PHYSICAL EXAM: VS:  BP 112/66   Pulse 72   Ht 5\' 6"  (1.676 m)   Wt 188 lb 12.8 oz (85.6 kg)   SpO2 95%   BMI 30.47 kg/m  , BMI Body mass index is 30.47 kg/m. GEN: Well nourished, well developed, in no acute distress  HEENT: normal  Neck: no JVD, carotid bruits, or masses Cardiac: RRR; no murmurs, rubs, or gallops,no edema  Respiratory:  clear to auscultation bilaterally, normal work of breathing GI: soft, nontender, nondistended, + BS MS: no deformity or atrophy  Skin: warm and dry, device site well healed Neuro:  Strength and sensation are intact Psych: euthymic mood, full affect  EKG:  EKG is ordered today. Personal review of the ekg ordered shows AV paced  Personal review of the device interrogation today. Results in [place]: No results found for requested labs within last 365 days.    Lipid Panel      Component Value Date/Time   CHOL 179 06/17/2022 1204   TRIG 236 (H) 06/17/2022 1204   HDL 39 (L) 06/17/2022 1204   CHOLHDL 4.6 (H) 06/17/2022 1204   CHOLHDL 6.2 09/05/2018 0048   VLDL 37 09/05/2018 0048   LDLCALC 100 (H) 06/17/2022 1204     Wt Readings from Last 3 Encounters:  10/17/22 188 lb 12.8 oz (85.6 kg)  06/17/22 194 lb 9.6 oz (88.3 kg)  08/27/21 194 lb 9.6 oz (88.3 kg)      Other studies Reviewed: Additional studies/ records that were reviewed today include: TTE 07/06/22  Review of the above records today demonstrates:   1. Frequent ectopy noted during the test.. Left ventricular ejection  fraction, by estimation, is 55 to 60%. The left ventricle has normal  function. The left ventricle has no regional wall motion abnormalities.  There is mild left ventricular hypertrophy.   Left ventricular diastolic parameters are consistent with Grade I  diastolic dysfunction (impaired relaxation).   2. Right ventricular systolic function is normal. The right ventricular  size is normal. There is normal pulmonary artery systolic pressure.   3. The mitral valve is normal in structure. Mild mitral valve  regurgitation. No evidence of mitral stenosis.   4. The aortic valve is normal in structure. Aortic valve regurgitation is  not visualized. No aortic stenosis is present.   5. The inferior vena cava is normal in size with greater than 50%  respiratory variability, suggesting right atrial pressure of 3 mmHg.    ASSESSMENT AND PLAN:  1.  Paroxysmal atrial fibrillation: Currently on metoprolol, diltiazem, Xarelto, doses above.  CHA2DS2-VASc of 4.  Remains AV paced.  No changes.  2.  Complete heart block: Status post Saint [name] dual-chamber pacemaker.  Device function appropriate.  No changes at this time.  3.  Coronary artery disease: Status post LAD stent.  No current chest pain.  Plan per primary cardiology  4.  Secondary hypercoagulable state: Currently on Xarelto for atrial  fibrillation as above  I was chaperoned during this clinic visit by Trinidad Curet, Myrtie Hawk, Geoffry Paradise.  Current medicines are reviewed at length with the patient today.   The patient does not have concerns regarding her medicines.  The following changes were made today: None  Labs/ tests ordered  today include:  Orders Placed This Encounter  Procedures   EKG 12-Lead      Disposition:   FU 1 year  Signed, Lakayla Barrington Jorja Loa, MD  10/17/2022 3:36 PM     [facility] [address] [place] [address] [phone] (office) [phone] (fax)

## 2022-10-30 ENCOUNTER — Other Ambulatory Visit: Payer: Self-pay | Admitting: Cardiology

## 2022-11-07 ENCOUNTER — Telehealth: Payer: Self-pay

## 2022-11-07 NOTE — Telephone Encounter (Signed)
Pharmacy notified via fax patient is not on Rosuvastatin due to reaction. See Allergies

## 2022-11-08 DIAGNOSIS — D329 Benign neoplasm of meninges, unspecified: Secondary | ICD-10-CM | POA: Insufficient documentation

## 2022-11-24 ENCOUNTER — Other Ambulatory Visit: Payer: Self-pay | Admitting: Cardiology

## 2022-11-24 NOTE — Telephone Encounter (Signed)
Prescription refill request for Xarelto received.  Indication: PAF Last office visit: 10/17/22  Elliot Cousin MD Weight: 85.6kg Age: 89 Scr: 1.50 on 11/08/22  Epic CrCl: 35.71  Based on above findings Xarelto '15mg'$  daily is the appropriate dose.  Refill approved.

## 2022-12-13 ENCOUNTER — Ambulatory Visit (INDEPENDENT_AMBULATORY_CARE_PROVIDER_SITE_OTHER): Payer: Medicare Other

## 2022-12-13 DIAGNOSIS — I442 Atrioventricular block, complete: Secondary | ICD-10-CM

## 2022-12-13 LAB — CUP PACEART REMOTE DEVICE CHECK
Battery Remaining Longevity: 51 mo
Battery Remaining Percentage: 46 %
Battery Voltage: 2.96 V
Brady Statistic AP VP Percent: 89 %
Brady Statistic AP VS Percent: 1 %
Brady Statistic AS VP Percent: 11 %
Brady Statistic AS VS Percent: 1 %
Brady Statistic RA Percent Paced: 90 %
Brady Statistic RV Percent Paced: 99 %
Date Time Interrogation Session: 20240402020032
Implantable Lead Connection Status: 753985
Implantable Lead Connection Status: 753985
Implantable Lead Implant Date: 20130321
Implantable Lead Implant Date: 20130321
Implantable Lead Location: 753859
Implantable Lead Location: 753860
Implantable Pulse Generator Implant Date: 20190503
Lead Channel Impedance Value: 360 Ohm
Lead Channel Impedance Value: 690 Ohm
Lead Channel Pacing Threshold Amplitude: 0.375 V
Lead Channel Pacing Threshold Amplitude: 0.75 V
Lead Channel Pacing Threshold Pulse Width: 0.4 ms
Lead Channel Pacing Threshold Pulse Width: 0.4 ms
Lead Channel Sensing Intrinsic Amplitude: 1.3 mV
Lead Channel Sensing Intrinsic Amplitude: 11.5 mV
Lead Channel Setting Pacing Amplitude: 0.625
Lead Channel Setting Pacing Amplitude: 2 V
Lead Channel Setting Pacing Pulse Width: 0.4 ms
Lead Channel Setting Sensing Sensitivity: 6 mV
Pulse Gen Model: 2272
Pulse Gen Serial Number: 9019281

## 2022-12-27 ENCOUNTER — Ambulatory Visit: Payer: Medicare Other | Admitting: Cardiology

## 2023-01-24 NOTE — Progress Notes (Signed)
Remote pacemaker transmission.   

## 2023-02-13 DIAGNOSIS — M1711 Unilateral primary osteoarthritis, right knee: Secondary | ICD-10-CM | POA: Insufficient documentation

## 2023-03-14 ENCOUNTER — Ambulatory Visit (INDEPENDENT_AMBULATORY_CARE_PROVIDER_SITE_OTHER): Payer: Medicare Other

## 2023-03-14 DIAGNOSIS — I442 Atrioventricular block, complete: Secondary | ICD-10-CM

## 2023-03-14 LAB — CUP PACEART REMOTE DEVICE CHECK
Battery Remaining Longevity: 47 mo
Battery Remaining Percentage: 43 %
Battery Voltage: 2.96 V
Brady Statistic AP VP Percent: 86 %
Brady Statistic AP VS Percent: 1 %
Brady Statistic AS VP Percent: 14 %
Brady Statistic AS VS Percent: 1 %
Brady Statistic RA Percent Paced: 87 %
Brady Statistic RV Percent Paced: 99 %
Date Time Interrogation Session: 20240702023854
Implantable Lead Connection Status: 753985
Implantable Lead Connection Status: 753985
Implantable Lead Implant Date: 20130321
Implantable Lead Implant Date: 20130321
Implantable Lead Location: 753859
Implantable Lead Location: 753860
Implantable Pulse Generator Implant Date: 20190503
Lead Channel Impedance Value: 360 Ohm
Lead Channel Impedance Value: 750 Ohm
Lead Channel Pacing Threshold Amplitude: 0.5 V
Lead Channel Pacing Threshold Amplitude: 0.75 V
Lead Channel Pacing Threshold Pulse Width: 0.4 ms
Lead Channel Pacing Threshold Pulse Width: 0.4 ms
Lead Channel Sensing Intrinsic Amplitude: 1.3 mV
Lead Channel Sensing Intrinsic Amplitude: 12 mV
Lead Channel Setting Pacing Amplitude: 0.75 V
Lead Channel Setting Pacing Amplitude: 2 V
Lead Channel Setting Pacing Pulse Width: 0.4 ms
Lead Channel Setting Sensing Sensitivity: 6 mV
Pulse Gen Model: 2272
Pulse Gen Serial Number: 9019281

## 2023-03-17 ENCOUNTER — Other Ambulatory Visit: Payer: Self-pay

## 2023-03-17 DIAGNOSIS — N2581 Secondary hyperparathyroidism of renal origin: Secondary | ICD-10-CM

## 2023-03-23 ENCOUNTER — Telehealth: Payer: Self-pay | Admitting: Cardiology

## 2023-03-23 NOTE — Telephone Encounter (Signed)
Pt is being taken to the hospital per son's statement and he would like a callback regarding if she is having a heart attack and if he needs to come up from [place]. Please advise.

## 2023-03-23 NOTE — Telephone Encounter (Signed)
Spoke with pts son advised that he should call ED for information on mother. Son agreed and had no further questions.

## 2023-04-06 ENCOUNTER — Ambulatory Visit: Payer: Medicare Other | Admitting: Cardiology

## 2023-04-07 NOTE — Progress Notes (Signed)
Remote pacemaker transmission.   

## 2023-04-12 ENCOUNTER — Ambulatory Visit: Payer: Medicare Other | Attending: Cardiology | Admitting: Cardiology

## 2023-04-12 VITALS — BP 118/62 | HR 72 | Ht 66.0 in | Wt 193.2 lb

## 2023-04-12 DIAGNOSIS — I442 Atrioventricular block, complete: Secondary | ICD-10-CM | POA: Diagnosis not present

## 2023-04-12 DIAGNOSIS — R600 Localized edema: Secondary | ICD-10-CM

## 2023-04-12 DIAGNOSIS — I2510 Atherosclerotic heart disease of native coronary artery without angina pectoris: Secondary | ICD-10-CM | POA: Diagnosis not present

## 2023-04-12 DIAGNOSIS — I10 Essential (primary) hypertension: Secondary | ICD-10-CM | POA: Diagnosis not present

## 2023-04-12 DIAGNOSIS — R0609 Other forms of dyspnea: Secondary | ICD-10-CM

## 2023-04-12 DIAGNOSIS — I480 Paroxysmal atrial fibrillation: Secondary | ICD-10-CM | POA: Diagnosis not present

## 2023-04-12 DIAGNOSIS — I2583 Coronary atherosclerosis due to lipid rich plaque: Secondary | ICD-10-CM

## 2023-04-12 NOTE — Addendum Note (Signed)
Addended by: Baldo Ash D on: 04/12/2023 09:10 AM   Modules accepted: Orders

## 2023-04-12 NOTE — Progress Notes (Signed)
Cardiology Office Note:    Date:  04/12/2023   ID:  Judith Flores, DOB Jan 14, 1934, MRN [id number]  PCP:  John Giovanni, MD  Cardiologist:  Gypsy Balsam, MD    Referring MD: John Giovanni, MD   Chief Complaint  Patient presents with   Follow-up    History of Present Illness:    Judith Flores is a 89 y.o. female with past medical history significant for coronary artery disease.  In 2019 she required stenting to mid LAD with drug-eluting stent at that time she was also found to have completely occluded small first diagonal branch.  Past medical history also significant for dyslipidemia with some difficulty tolerating statin, pacemaker present paroxysmal atrial fibrillation.  She is in my office for follow-up overall doing well she is described to have swelling of lower extremities she also recently ended being in the hospital because of urinary tract infection.  It look like it is a recurrent problem.  Cardiac wise doing well no chest pain tightness squeezing pressure burning chest no palpitations dizziness swelling of lower extremities  Past Medical History:  Diagnosis Date   Acute bronchitis 01/17/2011   Acute cystitis with hematuria 01/17/2011   Last Assessment & Plan:  Formatting of this note might be different from the original. Patient presents with history and physical exam as documented low that is most consistent with likely acute cystitis with hematuria. Unlikely to be vaginal source per patient and as such do not suspect that this should remain on the differential. Unlikely that this represents underlying malignancy with her point   Acute URI 08/19/2015   Anemia    Asteatotic dermatitis 10/16/2017   Atrial fibrillation (HCC) 02/16/2012   Atrioventricular block, complete (HCC) 11/17/2011   B12 deficiency 08/10/2016   Back pain 03/27/2014   Bilateral lower extremity edema 02/07/2017   CAD (coronary artery disease)    a. 08/2018 NSTEMI/PCI: LM nl, LAD 95p/m (3.5 x 12 mm resolute Onyx  DES and 2.5 x 38 mm resolute Onyx DES successfully placed), D1 100, D2 40, RI 95 (2.25 x 38 mm resolute Onyx DES), LCx 30p, RCA 62m 40d.   CKD (chronic kidney disease), stage III (HCC) 09/04/2018   Complete heart block (HCC)    intermittent   Depression screening 11/13/2013   Overview:  PHQ2 Done 11/13/13   Elevated troponin 09/04/2018   Epigastric pain 09/04/2018   GERD (gastroesophageal reflux disease)    HOH (hard of hearing)    HTN (hypertension)    Hyperlipidemia 08/12/2010   Hypertension 02/16/2012   Hypertension, benign 08/12/2010   Last Assessment & Plan:  Formatting of this note might be different from the original. Controlled based on today's blood pressure of 120/74. Last BMP was checked 08/13/2020 and showed creatinine at 1.50 which appears to be patient's baseline and mild hypokalemia at 3.3. Patient is taking antihypertensive regimen of diltiazem 120 mg PO QD, Lasix 20 mg PO QD, metoprolol tartrate 50 mg PO BID, Klor-Co   Iron deficiency 08/19/2015   Ischemic cardiomyopathy    a. 08/2018 Echo: Ef 50-55%, mild apical anteroseptal and apical HK. Gr1 DD. Triv TR. PaSP .   NSTEMI (non-ST elevated myocardial infarction) (HCC) 09/04/2018   Pacemaker    PAF (paroxysmal atrial fibrillation) (HCC)    Pain of right lower extremity 06/12/2017   Peripheral edema    polarity lead revision in both atrial and ventricular lead 11/17/2011   Recurrent upper respiratory infection (URI)    HX OF CHRONIC BRONCHITIS  Stage 3b chronic kidney disease (HCC) 08/19/2015   Last Assessment & Plan:  Formatting of this note might be different from the original. Stable at this time given creatinine 08/13/2020 was 1.50 which appears to be near patient's baseline. Last magnesium and phosphorous were checked on 08/13/2020 and were within normal limits. Last CBC was checked on 08/13/2020 and was normal. Last urine microalbumin to creatinine ratio was normal on 08/13/2020 -Conti   Statin intolerance 10/19/2018   Syncope     SYNCOPE, HX OF 03/17/2009   Qualifier: Diagnosis of  By: Graciela Husbands, MD, Susie Cassette    Vitamin D deficiency 08/12/2010    Past Surgical History:  Procedure Laterality Date   breast tumor     CORONARY STENT INTERVENTION N/A 09/07/2018   Procedure: CORONARY STENT INTERVENTION;  Surgeon: Tonny Bollman, MD;  Location: [facility] INVASIVE [facility];  Service: Cardiovascular;  Laterality: N/A;   INSERT / REPLACE / REMOVE PACEMAKER     St [name]   LEFT HEART CATH AND CORONARY ANGIOGRAPHY N/A 09/06/2018   Procedure: LEFT HEART CATH AND CORONARY ANGIOGRAPHY;  Surgeon: Tonny Bollman, MD;  Location: [facility] INVASIVE CV LAB;  Service: Cardiovascular;  Laterality: N/A;   PACEMAKER REVISION N/A 12/01/2011   Procedure: PACEMAKER REVISION;  Surgeon: [name], MD;  Location: [facility] CATH LAB;  Service: Cardiovascular;  Laterality: N/A;   PPM GENERATOR CHANGEOUT N/A 01/12/2018   Procedure: PPM GENERATOR CHANGEOUT;  Surgeon: [name], MD;  Location: [facility] INVASIVE CV LAB;  Service: Cardiovascular;  Laterality: N/A;    Current Medications: Current Meds  Medication Sig   clopidogrel (PLAVIX) 75 MG tablet Take 75 mg by mouth daily.   Coenzyme Q10 (COQ10) 100 MG CAPS Take 1 capsule by mouth daily.   Cyanocobalamin (VITAMIN B-12 IJ) Inject 1,000 mg as directed every 30 (thirty) days.    diltiazem (CARDIZEM CD) 120 MG 24 hr capsule TAKE 1 CAPSULE(120 MG) BY MOUTH DAILY (Patient taking differently: Take 120 mg by mouth daily.)   ferrous sulfate 325 (65 FE) MG tablet Take 325 mg by mouth daily with breakfast.   furosemide (LASIX) 20 MG tablet Take 20 mg by mouth as needed for edema.   hydrochlorothiazide (MICROZIDE) 12.5 MG capsule Take 1 capsule by mouth daily.   metoprolol succinate (TOPROL-XL) 100 MG 24 hr tablet Take 1.5 tablets (150 mg total) by mouth daily.   nitroGLYCERIN (NITROSTAT) 0.4 MG SL tablet Place 1 tablet (0.4 mg total) under the tongue every 5 (five) minutes x 3 doses as needed for chest pain.    omeprazole (PRILOSEC) 20 MG capsule Take 20 mg by mouth daily.    potassium chloride (KLOR-CON) 10 MEQ tablet Take 20 mEq by mouth in the morning.   Vitamin D, Ergocalciferol, (DRISDOL) 1.25 MG (50000 UT) CAPS capsule Take 50,000 Units by mouth every 7 (seven) days.   XARELTO 15 MG TABS tablet TAKE 1 TABLET(15 MG) BY MOUTH DAILY (Patient taking differently: Take 15 mg by mouth daily with supper.)     Allergies:   Atorvastatin, Crestor [rosuvastatin calcium], Niacin, Sulfa antibiotics, and Penicillins   Social History   Socioeconomic History   Marital status: Widowed    Spouse name: Not on file   Number of children: Not on file   Years of education: Not on file   Highest education level: Not on file  Occupational History   Not on file  Tobacco Use   Smoking status: Never   Smokeless tobacco: Never  Vaping Use  Vaping status: Never Used  Substance and Sexual Activity   Alcohol use: No   Drug use: No   Sexual activity: Never    Birth control/protection: Post-menopausal  Other Topics Concern   Not on file  Social History Narrative   ** Merged History Encounter **       Social Determinants of Health   Financial Resource Strain: Low Risk  (11/17/2020)   Received from [facility], [facility]   Overall Financial Resource Strain (CARDIA)    Difficulty of Paying Living Expenses: Not hard at all  Food Insecurity: No Food Insecurity (11/17/2020)   Received from [facility], [facility]   Hunger Vital Sign    Worried About Running Out of Food in the Last Year: Never true    Ran Out of Food in the Last Year: Never true  Transportation Needs: No Transportation Needs (07/20/2022)   Received from [facility], [facility]   [facility] - Transportation    Lack of Transportation (Medical): No    Lack of Transportation (Non-Medical): No  Physical Activity: Insufficiently Active (11/17/2020)   Received from [facility], [facility]   Exercise Vital Sign    Days  of Exercise per Week: 7 days    Minutes of Exercise per Session: 10 min  Stress: No Stress Concern Present (11/17/2020)   Received from [facility], [facility] of Occupational Health - Occupational Stress Questionnaire    Feeling of Stress : Not at all  Social Connections: Moderately Isolated (11/17/2020)   Received from [facility], [facility]   Social Connection and Isolation Panel [NHANES]    Frequency of Communication with Friends and Family: More than three times a week    Frequency of Social Gatherings with Friends and Family: More than three times a week    Attends Religious Services: More than 4 times per year    Active Member of [employer] or Organizations: No    Attends Banker Meetings: Never    Marital Status: Widowed     Family History: The patient's family history includes Heart disease in her father and mother. ROS:   Please see the history of present illness.    All 14 point review of systems negative except as described per history of present illness  EKGs/Labs/Other Studies Reviewed:         Recent Labs: No results found for requested labs within last 365 days.  Recent Lipid Panel    Component Value Date/Time   CHOL 179 06/17/2022 1204   TRIG 236 (H) 06/17/2022 1204   HDL 39 (L) 06/17/2022 1204   CHOLHDL 4.6 (H) 06/17/2022 1204   CHOLHDL 6.2 09/05/2018 0048   VLDL 37 09/05/2018 0048   LDLCALC 100 (H) 06/17/2022 1204    Physical Exam:    VS:  BP 118/62 (BP Location: Left Arm, Patient Position: Sitting)   Pulse 72   Ht 5\' 6"  (1.676 m)   Wt 193 lb 3.2 oz (87.6 kg)   SpO2 92%   BMI 31.18 kg/m     Wt Readings from Last 3 Encounters:  04/12/23 193 lb 3.2 oz (87.6 kg)  10/17/22 188 lb 12.8 oz (85.6 kg)  06/17/22 194 lb 9.6 oz (88.3 kg)     GEN:  Well nourished, well developed in no acute distress HEENT: Normal NECK: No JVD; No carotid bruits LYMPHATICS: No lymphadenopathy CARDIAC: RRR, no murmurs, no  rubs,  no gallops RESPIRATORY:  Clear to auscultation without rales, wheezing or rhonchi  ABDOMEN: Soft, non-tender, non-distended MUSCULOSKELETAL:  No edema; No deformity  SKIN: Warm and dry LOWER EXTREMITIES: no swelling NEUROLOGIC:  Alert and oriented x 3 PSYCHIATRIC:  Normal affect   ASSESSMENT:    1. Paroxysmal atrial fibrillation (HCC)   2. Coronary artery disease due to lipid rich plaque   3. Hypertension, benign   4. Complete heart block (HCC)   5. Bilateral lower extremity edema    PLAN:    In order of problems listed above:  Paroxysmal atrial fibrillation interrogation of the pacemaker shows still some recurrences of arrhythmia overall burden 6.5%.  She is asymptomatic, she is anticoagulated. Coronary artery disease status post PTCA and stenting in 2019.  She still on Plavix I brought an issue of potentially stopping this medication today however she is terribly scared of stroke and heart attack and insist on continuation of the medication.  Since she has no difficulty tolerating it we will continue for now. Essential hypertension blood pressure well-controlled continue present management. Complete heart block she does have a pacemaker with function I did review interrogation.  Parameters are normal. Bilateral lower extremities edema.  Will schedule her to have Chem-7 proBNP and decide about potentially augmenting her diuretic.   Medication Adjustments/Labs and Tests Ordered: Current medicines are reviewed at length with the patient today.  Concerns regarding medicines are outlined above.  Orders Placed This Encounter  Procedures   EKG 12-Lead   Medication changes: No orders of the defined types were placed in this encounter.   Signed, Georgeanna Lea, MD, [facility] 04/12/2023 9:05 AM    [facility]

## 2023-04-12 NOTE — Patient Instructions (Addendum)
Medication Instructions:  Your physician recommends that you continue on your current medications as directed. Please refer to the Current Medication list given to you today.  *If you need a refill on your cardiac medications before your next appointment, please call your pharmacy*   Lab Work: ProBNP, BMP- today If you have labs (blood work) drawn today and your tests are completely normal, you will receive your results only by: MyChart Message (if you have MyChart) OR A paper copy in the mail If you have any lab test that is abnormal or we need to change your treatment, we will call you to review the results.   Testing/Procedures: None Ordered   Follow-Up: At [facility], you and your health needs are our priority.  As part of our continuing mission to provide you with exceptional heart care, we have created designated Provider Care Teams.  These Care Teams include your primary Cardiologist (physician) and Advanced Practice Providers (APPs -  Physician Assistants and Nurse Practitioners) who all work together to provide you with the care you need, when you need it.  We recommend signing up for the patient portal called "MyChart".  Sign up information is provided on this After Visit Summary.  MyChart is used to connect with patients for Virtual Visits (Telemedicine).  Patients are able to view lab/test results, encounter notes, upcoming appointments, etc.  Non-urgent messages can be sent to your provider as well.   To learn more about what you can do with MyChart, go to [email or web address].    Your next appointment:   6 month(s)  The format for your next appointment:   In Person  Provider:   Gypsy Balsam, MD    Other Instructions NA

## 2023-04-20 ENCOUNTER — Other Ambulatory Visit: Payer: Medicare Other

## 2023-04-26 ENCOUNTER — Ambulatory Visit: Payer: Medicare Other | Admitting: "Endocrinology

## 2023-05-17 DIAGNOSIS — H3530 Unspecified macular degeneration: Secondary | ICD-10-CM | POA: Insufficient documentation

## 2023-06-04 ENCOUNTER — Other Ambulatory Visit: Payer: Self-pay | Admitting: Cardiology

## 2023-06-05 NOTE — Telephone Encounter (Signed)
Prescription refill request for Xarelto received.  Indication:afib Last office visit:7/24 Weight:87.6  kg Age:89 Scr:1.4  7/24 CrCl:39.15  ml/min  Prescription refilled

## 2023-06-12 ENCOUNTER — Other Ambulatory Visit: Payer: Self-pay | Admitting: Cardiology

## 2023-06-12 NOTE — Telephone Encounter (Signed)
Rx refill sent to pharmacy. 

## 2023-06-13 ENCOUNTER — Ambulatory Visit (INDEPENDENT_AMBULATORY_CARE_PROVIDER_SITE_OTHER): Payer: Medicare Other

## 2023-06-13 DIAGNOSIS — I442 Atrioventricular block, complete: Secondary | ICD-10-CM | POA: Diagnosis not present

## 2023-06-13 LAB — CUP PACEART REMOTE DEVICE CHECK
Battery Remaining Longevity: 44 mo
Battery Remaining Percentage: 40 %
Battery Voltage: 2.95 V
Brady Statistic AP VP Percent: 84 %
Brady Statistic AP VS Percent: 1 %
Brady Statistic AS VP Percent: 16 %
Brady Statistic AS VS Percent: 1 %
Brady Statistic RA Percent Paced: 85 %
Brady Statistic RV Percent Paced: 99 %
Date Time Interrogation Session: 20241001032204
Implantable Lead Connection Status: 753985
Implantable Lead Connection Status: 753985
Implantable Lead Implant Date: 20130321
Implantable Lead Implant Date: 20130321
Implantable Lead Location: 753859
Implantable Lead Location: 753860
Implantable Pulse Generator Implant Date: 20190503
Lead Channel Impedance Value: 360 Ohm
Lead Channel Impedance Value: 650 Ohm
Lead Channel Pacing Threshold Amplitude: 0.375 V
Lead Channel Pacing Threshold Amplitude: 0.75 V
Lead Channel Pacing Threshold Pulse Width: 0.4 ms
Lead Channel Pacing Threshold Pulse Width: 0.4 ms
Lead Channel Sensing Intrinsic Amplitude: 12 mV
Lead Channel Sensing Intrinsic Amplitude: 2.3 mV
Lead Channel Setting Pacing Amplitude: 0.625
Lead Channel Setting Pacing Amplitude: 2 V
Lead Channel Setting Pacing Pulse Width: 0.4 ms
Lead Channel Setting Sensing Sensitivity: 6 mV
Pulse Gen Model: 2272
Pulse Gen Serial Number: 9019281

## 2023-06-28 NOTE — Progress Notes (Signed)
Remote pacemaker transmission.   

## 2023-09-12 ENCOUNTER — Ambulatory Visit (INDEPENDENT_AMBULATORY_CARE_PROVIDER_SITE_OTHER): Payer: Medicare Other

## 2023-09-12 DIAGNOSIS — I442 Atrioventricular block, complete: Secondary | ICD-10-CM | POA: Diagnosis not present

## 2023-09-12 LAB — CUP PACEART REMOTE DEVICE CHECK
Battery Remaining Longevity: 40 mo
Battery Remaining Percentage: 37 %
Battery Voltage: 2.95 V
Brady Statistic AP VP Percent: 81 %
Brady Statistic AP VS Percent: 1 %
Brady Statistic AS VP Percent: 19 %
Brady Statistic AS VS Percent: 1 %
Brady Statistic RA Percent Paced: 82 %
Brady Statistic RV Percent Paced: 99 %
Date Time Interrogation Session: 20241231030101
Implantable Lead Connection Status: 753985
Implantable Lead Connection Status: 753985
Implantable Lead Implant Date: 20130321
Implantable Lead Implant Date: 20130321
Implantable Lead Location: 753859
Implantable Lead Location: 753860
Implantable Pulse Generator Implant Date: 20190503
Lead Channel Impedance Value: 340 Ohm
Lead Channel Impedance Value: 640 Ohm
Lead Channel Pacing Threshold Amplitude: 0.375 V
Lead Channel Pacing Threshold Amplitude: 0.75 V
Lead Channel Pacing Threshold Pulse Width: 0.4 ms
Lead Channel Pacing Threshold Pulse Width: 0.4 ms
Lead Channel Sensing Intrinsic Amplitude: 1.3 mV
Lead Channel Sensing Intrinsic Amplitude: 12 mV
Lead Channel Setting Pacing Amplitude: 0.625
Lead Channel Setting Pacing Amplitude: 2 V
Lead Channel Setting Pacing Pulse Width: 0.4 ms
Lead Channel Setting Sensing Sensitivity: 6 mV
Pulse Gen Model: 2272
Pulse Gen Serial Number: 9019281

## 2023-10-11 ENCOUNTER — Ambulatory Visit: Payer: Medicare Other | Attending: Cardiology | Admitting: Cardiology

## 2023-10-11 ENCOUNTER — Encounter: Payer: Self-pay | Admitting: Cardiology

## 2023-10-11 VITALS — BP 138/70 | HR 71 | Ht 66.5 in | Wt 194.6 lb

## 2023-10-11 DIAGNOSIS — I10 Essential (primary) hypertension: Secondary | ICD-10-CM

## 2023-10-11 DIAGNOSIS — I2583 Coronary atherosclerosis due to lipid rich plaque: Secondary | ICD-10-CM | POA: Diagnosis not present

## 2023-10-11 DIAGNOSIS — I480 Paroxysmal atrial fibrillation: Secondary | ICD-10-CM | POA: Diagnosis not present

## 2023-10-11 DIAGNOSIS — I2510 Atherosclerotic heart disease of native coronary artery without angina pectoris: Secondary | ICD-10-CM

## 2023-10-11 DIAGNOSIS — E782 Mixed hyperlipidemia: Secondary | ICD-10-CM

## 2023-10-11 DIAGNOSIS — R0609 Other forms of dyspnea: Secondary | ICD-10-CM

## 2023-10-11 DIAGNOSIS — I442 Atrioventricular block, complete: Secondary | ICD-10-CM

## 2023-10-11 NOTE — Patient Instructions (Signed)
Medication Instructions:  Your physician recommends that you continue on your current medications as directed. Please refer to the Current Medication list given to you today.  *If you need a refill on your cardiac medications before your next appointment, please call your pharmacy*   Lab Work: BMP, ProBNP- today If you have labs (blood work) drawn today and your tests are completely normal, you will receive your results only by: MyChart Message (if you have MyChart) OR A paper copy in the mail If you have any lab test that is abnormal or we need to change your treatment, we will call you to review the results.   Testing/Procedures: Your physician has requested that you have an echocardiogram. Echocardiography is a painless test that uses sound waves to create images of your heart. It provides your doctor with information about the size and shape of your heart and how well your heart's chambers and valves are working. This procedure takes approximately one hour. There are no restrictions for this procedure. Please do NOT wear cologne, perfume, aftershave, or lotions (deodorant is allowed). Please arrive 15 minutes prior to your appointment time.  Please note: We ask at that you not bring children with you during ultrasound (echo/ vascular) testing. Due to room size and safety concerns, children are not allowed in the ultrasound rooms during exams. Our front office staff cannot provide observation of children in our lobby area while testing is being conducted. An adult accompanying a patient to their appointment will only be allowed in the ultrasound room at the discretion of the ultrasound technician under special circumstances. We apologize for any inconvenience.    Follow-Up: At [facility], you and your health needs are our priority.  As part of our continuing mission to provide you with exceptional heart care, we have created designated Provider Care Teams.  These Care Teams include your  primary Cardiologist (physician) and Advanced Practice Providers (APPs -  Physician Assistants and Nurse Practitioners) who all work together to provide you with the care you need, when you need it.  We recommend signing up for the patient portal called "MyChart".  Sign up information is provided on this After Visit Summary.  MyChart is used to connect with patients for Virtual Visits (Telemedicine).  Patients are able to view lab/test results, encounter notes, upcoming appointments, etc.  Non-urgent messages can be sent to your provider as well.   To learn more about what you can do with MyChart, go to [email or web address].    Your next appointment:   6 month(s)  The format for your next appointment:   In Person  Provider:   Gypsy Balsam, MD    Other Instructions NA

## 2023-10-11 NOTE — Addendum Note (Signed)
Addended by: Baldo Ash D on: 10/11/2023 10:04 AM   Modules accepted: Orders

## 2023-10-11 NOTE — Progress Notes (Signed)
Cardiology Office Note:    Date:  10/11/2023   ID:  Judith Flores, DOB 1934/02/08, MRN [id number]  PCP:  Judith Giovanni, MD  Cardiologist:  Judith Balsam, MD    Referring MD: Judith Giovanni, MD   Chief Complaint  Patient presents with   Follow-up    History of Present Illness:    Judith Flores is a 89 y.o. female  with past medical history significant for coronary artery disease. In 2019 she required stenting to mid LAD with drug-eluting stent at that time she was also found to have completely occluded small first diagonal branch. Past medical history also significant for dyslipidemia with some difficulty tolerating statin, pacemaker present paroxysmal atrial fibrillation.  Comes today to months for follow-up.  Overall doing well.  She denies having any chest pain tightness squeezing pressure burning chest.  She does describe to have a balance problem she walks around with a cane.  He fell in doctor's office few months ago end up being in the hospital after that.  Past Medical History:  Diagnosis Date   Acute bronchitis 01/17/2011   Acute cystitis with hematuria 01/17/2011   Last Assessment & Plan:  Formatting of this note might be different from the original. Patient presents with history and physical exam as documented low that is most consistent with likely acute cystitis with hematuria. Unlikely to be vaginal source per patient and as such do not suspect that this should remain on the differential. Unlikely that this represents underlying malignancy with her point   Acute URI 08/19/2015   Anemia    Asteatotic dermatitis 10/16/2017   Atrial fibrillation (HCC) 02/16/2012   Atrioventricular block, complete (HCC) 11/17/2011   B12 deficiency 08/10/2016   Back pain 03/27/2014   Bilateral lower extremity edema 02/07/2017   CAD (coronary artery disease)    a. 08/2018 NSTEMI/PCI: LM nl, LAD 95p/m (3.5 x 12 mm resolute Onyx DES and 2.5 x 38 mm resolute Onyx DES successfully placed), D1 100, D2 40,  RI 95 (2.25 x 38 mm resolute Onyx DES), LCx 30p, RCA 53m 40d.   CKD (chronic kidney disease), stage III (HCC) 09/04/2018   Complete heart block (HCC)    intermittent   Depression screening 11/13/2013   Overview:  PHQ2 Done 11/13/13   Elevated troponin 09/04/2018   Epigastric pain 09/04/2018   GERD (gastroesophageal reflux disease)    HOH (hard of hearing)    HTN (hypertension)    Hyperlipidemia 08/12/2010   Hypertension 02/16/2012   Hypertension, benign 08/12/2010   Last Assessment & Plan:  Formatting of this note might be different from the original. Controlled based on today's blood pressure of 120/74. Last BMP was checked 08/13/2020 and showed creatinine at 1.50 which appears to be patient's baseline and mild hypokalemia at 3.3. Patient is taking antihypertensive regimen of diltiazem 120 mg PO QD, Lasix 20 mg PO QD, metoprolol tartrate 50 mg PO BID, Klor-Co   Iron deficiency 08/19/2015   Ischemic cardiomyopathy    a. 08/2018 Echo: Ef 50-55%, mild apical anteroseptal and apical HK. Gr1 DD. Triv TR. PaSP .   NSTEMI (non-ST elevated myocardial infarction) (HCC) 09/04/2018   Pacemaker    PAF (paroxysmal atrial fibrillation) (HCC)    Pain of right lower extremity 06/12/2017   Peripheral edema    polarity lead revision in both atrial and ventricular lead 11/17/2011   Recurrent upper respiratory infection (URI)    HX OF CHRONIC BRONCHITIS   Stage 3b chronic kidney disease (HCC) 08/19/2015  Last Assessment & Plan:  Formatting of this note might be different from the original. Stable at this time given creatinine 08/13/2020 was 1.50 which appears to be near patient's baseline. Last magnesium and phosphorous were checked on 08/13/2020 and were within normal limits. Last CBC was checked on 08/13/2020 and was normal. Last urine microalbumin to creatinine ratio was normal on 08/13/2020 -Conti   Statin intolerance 10/19/2018   Syncope    SYNCOPE, HX OF 03/17/2009   Qualifier: Diagnosis of  By: Graciela Husbands, MD, Susie Cassette    Vitamin D deficiency 08/12/2010    Past Surgical History:  Procedure Laterality Date   breast tumor     CORONARY STENT INTERVENTION N/A 09/07/2018   Procedure: CORONARY STENT INTERVENTION;  Surgeon: Tonny Bollman, MD;  Location: [facility] INVASIVE CV LAB;  Service: Cardiovascular;  Laterality: N/A;   INSERT / REPLACE / REMOVE PACEMAKER     St [name]   LEFT HEART CATH AND CORONARY ANGIOGRAPHY N/A 09/06/2018   Procedure: LEFT HEART CATH AND CORONARY ANGIOGRAPHY;  Surgeon: Tonny Bollman, MD;  Location: [facility] INVASIVE CV LAB;  Service: Cardiovascular;  Laterality: N/A;   PACEMAKER REVISION N/A 12/01/2011   Procedure: PACEMAKER REVISION;  Surgeon: [name], MD;  Location: [facility] CATH LAB;  Service: Cardiovascular;  Laterality: N/A;   PPM GENERATOR CHANGEOUT N/A 01/12/2018   Procedure: PPM GENERATOR CHANGEOUT;  Surgeon: [name], MD;  Location: [facility] INVASIVE CV LAB;  Service: Cardiovascular;  Laterality: N/A;    Current Medications: Current Meds  Medication Sig   clopidogrel (PLAVIX) 75 MG tablet Take 75 mg by mouth daily.   Coenzyme Q10 (COQ10) 100 MG CAPS Take 1 capsule by mouth daily.   Cranberry-Vitamin C-Probiotic (AZO CRANBERRY PO) Take 1 tablet by mouth daily.   Cyanocobalamin (VITAMIN B-12 IJ) Inject 1,000 mg as directed every 30 (thirty) days.    diltiazem (CARDIZEM CD) 120 MG 24 hr capsule TAKE 1 CAPSULE(120 MG) BY MOUTH DAILY (Patient taking differently: Take 120 mg by mouth daily.)   ferrous sulfate 325 (65 FE) MG tablet Take 325 mg by mouth daily with breakfast.   furosemide (LASIX) 20 MG tablet Take 20 mg by mouth as needed for edema.   hydrochlorothiazide (MICROZIDE) 12.5 MG capsule Take 1 capsule by mouth daily.   metoprolol succinate (TOPROL-XL) 100 MG 24 hr tablet Take 1.5 tablets (150 mg total) by mouth daily.   Multiple Vitamins-Minerals (PRESERVISION AREDS 2 PO) Take 1 tablet by mouth in the morning and at bedtime.   nitroGLYCERIN (NITROSTAT) 0.4 MG SL  tablet Place 1 tablet (0.4 mg total) under the tongue every 5 (five) minutes x 3 doses as needed for chest pain.   omeprazole (PRILOSEC) 20 MG capsule Take 20 mg by mouth daily.    potassium chloride (KLOR-CON) 10 MEQ tablet Take 20 mEq by mouth in the morning.   rosuvastatin (CRESTOR) 5 MG tablet Take 5 mg by mouth daily.   Vitamin D, Ergocalciferol, (DRISDOL) 1.25 MG (50000 UT) CAPS capsule Take 50,000 Units by mouth every 7 (seven) days.   XARELTO 15 MG TABS tablet TAKE 1 TABLET(15 MG) BY MOUTH DAILY (Patient taking differently: Take 15 mg by mouth daily with supper.)     Allergies:   Atorvastatin, Crestor [rosuvastatin calcium], Niacin, Sulfa antibiotics, and Penicillins   Social History   Socioeconomic History   Marital status: Widowed    Spouse name: Not on file   Number of children: Not on file   Years of education: Not  on file   Highest education level: Not on file  Occupational History   Not on file  Tobacco Use   Smoking status: Never   Smokeless tobacco: Never  Vaping Use   Vaping status: Never Used  Substance and Sexual Activity   Alcohol use: No   Drug use: No   Sexual activity: Never    Birth control/protection: Post-menopausal  Other Topics Concern   Not on file  Social History Narrative   ** Merged History Encounter **       Social Drivers of Health   Financial Resource Strain: Low Risk  (11/17/2020)   Received from [facility], [facility]   Overall Financial Resource Strain (CARDIA)    Difficulty of Paying Living Expenses: Not hard at all  Food Insecurity: No Food Insecurity (08/14/2023)   Received from [facility]   Hunger Vital Sign    Worried About Running Out of Food in the Last Year: Never true    Ran Out of Food in the Last Year: Never true  Transportation Needs: No Transportation Needs (08/14/2023)   Received from [facility]   PRAPARE - Transportation    Lack of Transportation (Medical): No    Lack of Transportation  (Non-Medical): No  Physical Activity: Insufficiently Active (11/17/2020)   Received from [facility], [facility]   Exercise Vital Sign    Days of Exercise per Week: 7 days    Minutes of Exercise per Session: 10 min  Stress: No Stress Concern Present (11/17/2020)   Received from [facility], [facility] - Occupational Stress Questionnaire    Feeling of Stress : Not at all  Social Connections: Moderately Isolated (11/17/2020)   Received from [facility], [facility]   Social Connection and Isolation Panel [NHANES]    Frequency of Communication with Friends and Family: More than three times a week    Frequency of Social Gatherings with Friends and Family: More than three times a week    Attends Religious Services: More than 4 times per year    Active Member of [employer] or Organizations: No    Attends Banker Meetings: Never    Marital Status: Widowed     Family History: The patient's family history includes Heart disease in her father and mother. ROS:   Please see the history of present illness.    All 14 point review of systems negative except as described per history of present illness  EKGs/Labs/Other Studies Reviewed:         Recent Labs: 04/12/2023: BUN 24; Creatinine, Ser 1.40; NT-Pro BNP 756; Potassium 4.3; Sodium 140  Recent Lipid Panel    Component Value Date/Time   CHOL 179 06/17/2022 1204   TRIG 236 (H) 06/17/2022 1204   HDL 39 (L) 06/17/2022 1204   CHOLHDL 4.6 (H) 06/17/2022 1204   CHOLHDL 6.2 09/05/2018 0048   VLDL 37 09/05/2018 0048   LDLCALC 100 (H) 06/17/2022 1204    Physical Exam:    VS:  BP 138/70 (BP Location: Left Arm, Patient Position: Sitting)   Pulse 71   Ht 5' 6.5" (1.689 m)   Wt 194 lb 9.6 oz (88.3 kg)   SpO2 93%   BMI 30.94 kg/m     Wt Readings from Last 3 Encounters:  10/11/23 194 lb 9.6 oz (88.3 kg)  04/12/23 193 lb 3.2 oz (87.6 kg)  10/17/22 188 lb 12.8 oz (85.6  kg)     GEN:  Well nourished, well developed in no acute distress HEENT: Normal NECK: No JVD; No carotid bruits LYMPHATICS: No lymphadenopathy CARDIAC: RRR, no murmurs, no rubs, no gallops RESPIRATORY:  Clear to auscultation without rales, wheezing or rhonchi  ABDOMEN: Soft, non-tender, non-distended MUSCULOSKELETAL:  No edema; No deformity  SKIN: Warm and dry LOWER EXTREMITIES: 1+ swelling NEUROLOGIC:  Alert and oriented x 3 PSYCHIATRIC:  Normal affect   ASSESSMENT:    1. Coronary artery disease due to lipid rich plaque   2. Paroxysmal atrial fibrillation (HCC)   3. Complete heart block (HCC)   4. Primary hypertension   5. Mixed hyperlipidemia    PLAN:    In order of problems listed above:  Coronary disease stable from that point review we will continue present management.  She is absolutely fine with taking Plavix and does not want to stop it. Paroxysmal atrial fibrillation, I did review interrogation of her device this is sent [name] device, she does have some very short lasting episode of atrial fibrillation.  Continue anticoagulation. Complete heart block is being addressed with the pacemaker normal function followed by a very EP team. Essential hypertension blood pressure well-controlled continue present management. Dyslipidemia I did review K PN show me data from 2023 we will call primary care physician to get more updated information about this she says she does not take any more medication for cholesterol. Swelling of lower extremities which seems to really be worse today check proBNP Chem-7 will also do echocardiogram to assess left ventricle ejection fraction   Medication Adjustments/Labs and Tests Ordered: Current medicines are reviewed at length with the patient today.  Concerns regarding medicines are outlined above.  No orders of the defined types were placed in this encounter.  Medication changes: No orders of the defined types were placed in this  encounter.   Signed, Georgeanna Lea, MD, [facility] 10/11/2023 9:48 AM    [facility]

## 2023-10-12 LAB — BASIC METABOLIC PANEL
BUN/Creatinine Ratio: 14 (ref 12–28)
BUN: 19 mg/dL (ref 8–27)
CO2: 21 mmol/L (ref 20–29)
Calcium: 10.3 mg/dL (ref 8.7–10.3)
Chloride: 108 mmol/L — ABNORMAL HIGH (ref 96–106)
Creatinine, Ser: 1.39 mg/dL — ABNORMAL HIGH (ref 0.57–1.00)
Glucose: 107 mg/dL — ABNORMAL HIGH (ref 70–99)
Potassium: 4.6 mmol/L (ref 3.5–5.2)
Sodium: 143 mmol/L (ref 134–144)
eGFR: 36 mL/min/{1.73_m2} — ABNORMAL LOW (ref >59)

## 2023-10-12 LAB — PRO B NATRIURETIC PEPTIDE: NT-Pro BNP: 816 pg/mL — ABNORMAL HIGH (ref 0–738)

## 2023-10-17 ENCOUNTER — Telehealth: Payer: Self-pay

## 2023-10-17 DIAGNOSIS — I10 Essential (primary) hypertension: Secondary | ICD-10-CM

## 2023-10-17 NOTE — Telephone Encounter (Signed)
Left message on My Chart with Lab results per Dr. Vanetta Shawl note. Routed to PCP.

## 2023-10-24 NOTE — Progress Notes (Signed)
Remote pacemaker transmission.

## 2023-10-31 ENCOUNTER — Ambulatory Visit: Payer: Medicare Other

## 2023-11-23 ENCOUNTER — Other Ambulatory Visit: Payer: Medicare Other

## 2023-12-03 ENCOUNTER — Other Ambulatory Visit: Payer: Self-pay | Admitting: Cardiology

## 2023-12-04 NOTE — Telephone Encounter (Signed)
 Prescription refill request for Xarelto received.  Indication: Afib  Last office visit: 10/11/23 Bing Matter)  Weight: 88.3kg Age: 89 Scr: 1.39 (10/11/23)  CrCl: 2ml/min  Appropriate dose. Refill sent.

## 2023-12-12 ENCOUNTER — Ambulatory Visit (INDEPENDENT_AMBULATORY_CARE_PROVIDER_SITE_OTHER): Payer: Medicare Other

## 2023-12-12 DIAGNOSIS — I442 Atrioventricular block, complete: Secondary | ICD-10-CM | POA: Diagnosis not present

## 2023-12-12 LAB — CUP PACEART REMOTE DEVICE CHECK
Battery Remaining Longevity: 36 mo
Battery Remaining Percentage: 34 %
Battery Voltage: 2.95 V
Brady Statistic AP VP Percent: 80 %
Brady Statistic AP VS Percent: 1 %
Brady Statistic AS VP Percent: 20 %
Brady Statistic AS VS Percent: 1 %
Brady Statistic RA Percent Paced: 81 %
Brady Statistic RV Percent Paced: 99 %
Date Time Interrogation Session: 20250401023914
Implantable Lead Connection Status: 753985
Implantable Lead Connection Status: 753985
Implantable Lead Implant Date: 20130321
Implantable Lead Implant Date: 20130321
Implantable Lead Location: 753859
Implantable Lead Location: 753860
Implantable Pulse Generator Implant Date: 20190503
Lead Channel Impedance Value: 310 Ohm
Lead Channel Impedance Value: 660 Ohm
Lead Channel Pacing Threshold Amplitude: 0.5 V
Lead Channel Pacing Threshold Amplitude: 0.75 V
Lead Channel Pacing Threshold Pulse Width: 0.4 ms
Lead Channel Pacing Threshold Pulse Width: 0.4 ms
Lead Channel Sensing Intrinsic Amplitude: 1.3 mV
Lead Channel Sensing Intrinsic Amplitude: 9.4 mV
Lead Channel Setting Pacing Amplitude: 0.75 V
Lead Channel Setting Pacing Amplitude: 2 V
Lead Channel Setting Pacing Pulse Width: 0.4 ms
Lead Channel Setting Sensing Sensitivity: 6 mV
Pulse Gen Model: 2272
Pulse Gen Serial Number: 9019281

## 2024-01-25 NOTE — Progress Notes (Signed)
 Remote pacemaker transmission.

## 2024-01-29 ENCOUNTER — Ambulatory Visit: Payer: Medicare Other | Attending: Cardiology | Admitting: Cardiology

## 2024-01-29 ENCOUNTER — Encounter: Payer: Self-pay | Admitting: Cardiology

## 2024-01-29 VITALS — BP 122/70 | HR 74 | Ht 66.0 in | Wt 193.0 lb

## 2024-01-29 DIAGNOSIS — I2510 Atherosclerotic heart disease of native coronary artery without angina pectoris: Secondary | ICD-10-CM

## 2024-01-29 DIAGNOSIS — I442 Atrioventricular block, complete: Secondary | ICD-10-CM

## 2024-01-29 DIAGNOSIS — D6869 Other thrombophilia: Secondary | ICD-10-CM | POA: Diagnosis not present

## 2024-01-29 DIAGNOSIS — I480 Paroxysmal atrial fibrillation: Secondary | ICD-10-CM | POA: Diagnosis not present

## 2024-01-29 LAB — CUP PACEART INCLINIC DEVICE CHECK
Battery Remaining Longevity: 34 mo
Battery Voltage: 2.95 V
Brady Statistic RA Percent Paced: 81 %
Brady Statistic RV Percent Paced: 99.94 %
Date Time Interrogation Session: 20250519212204
Implantable Lead Connection Status: 753985
Implantable Lead Connection Status: 753985
Implantable Lead Implant Date: 20130321
Implantable Lead Implant Date: 20130321
Implantable Lead Location: 753859
Implantable Lead Location: 753860
Implantable Pulse Generator Implant Date: 20190503
Lead Channel Impedance Value: 325 Ohm
Lead Channel Impedance Value: 700 Ohm
Lead Channel Pacing Threshold Amplitude: 0.5 V
Lead Channel Pacing Threshold Amplitude: 0.5 V
Lead Channel Pacing Threshold Amplitude: 0.5 V
Lead Channel Pacing Threshold Amplitude: 0.5 V
Lead Channel Pacing Threshold Pulse Width: 0.4 ms
Lead Channel Pacing Threshold Pulse Width: 0.4 ms
Lead Channel Pacing Threshold Pulse Width: 0.4 ms
Lead Channel Pacing Threshold Pulse Width: 0.4 ms
Lead Channel Sensing Intrinsic Amplitude: 1.1 mV
Lead Channel Sensing Intrinsic Amplitude: 12 mV
Lead Channel Setting Pacing Amplitude: 0.75 V
Lead Channel Setting Pacing Amplitude: 2 V
Lead Channel Setting Pacing Pulse Width: 0.4 ms
Lead Channel Setting Sensing Sensitivity: 6 mV
Pulse Gen Model: 2272
Pulse Gen Serial Number: 9019281

## 2024-01-29 NOTE — Progress Notes (Signed)
  Electrophysiology Office Note:   Date:  01/29/2024  ID:  Judith Flores, DOB 05-Dec-1933, MRN [id number]  Primary Cardiologist: Judith Burger, MD Primary Heart Failure: None Electrophysiologist: Judith Giusto Cortland Ding, MD      History of Present Illness:   Judith Flores is a 89 y.o. female with h/o coronary artery disease post LAD stent with non-STEMI December 2019, hypertension, hyperlipidemia, complete heart block, atrial fibrillation seen today for routine electrophysiology followup.   Since last being seen in our clinic the patient reports increasing fatigue and weakness.  She has been feeling this for the last few months.  She previously was a quite active.  Based on device interrogation, her histograms are quite flat.  She is taking her metoprolol  in the mornings.  she denies chest pain, palpitations, dyspnea, PND, orthopnea, nausea, vomiting, dizziness, syncope, edema, weight gain, or early satiety.   Review of systems complete and found to be negative unless listed in HPI.      EP Information / Studies Reviewed:    EKG is ordered today. Personal review as below.  EKG Interpretation Date/Time:  Monday Jan 29 2024 15:14:20 EDT Ventricular Rate:  74 PR Interval:  176 QRS Duration:  160 QT Interval:  456 QTC Calculation: 506 R Axis:   -84  Text Interpretation: AV dual-paced rhythm When compared with ECG of 12-Apr-2023 08:39, Vent. rate has increased BY   3 BPM Confirmed by Judith Flores ([id number]) on 01/29/2024 4:35:38 PM   PPM Interrogation-  reviewed in detail today,  See PACEART report.  Device History: Abbott Dual Chamber PPM implanted for CHB  Risk Assessment/Calculations:    CHA2DS2-VASc Score = 5   This indicates a 7.2% annual risk of stroke. The patient's score is based upon:        Physical Exam:   VS:  BP 122/70   Pulse 74   Ht 5\' 6"  (1.676 m)   Wt 193 lb (87.5 kg)   SpO2 97%   BMI 31.15 kg/m    Wt Readings from Last 3 Encounters:  01/29/24 193 lb (87.5  kg)  10/11/23 194 lb 9.6 oz (88.3 kg)  04/12/23 193 lb 3.2 oz (87.6 kg)     GEN: Well nourished, well developed in no acute distress NECK: No JVD; No carotid bruits CARDIAC: Regular rate and rhythm, no murmurs, rubs, gallops RESPIRATORY:  Clear to auscultation without rales, wheezing or rhonchi  ABDOMEN: Soft, non-tender, non-distended EXTREMITIES:  No edema; No deformity   ASSESSMENT AND PLAN:    CHB s/p Abbott PPM  Normal PPM function Sensing, threshold, impedance within normal limits Device programming reviewed and appropriate See Judith Flores report She is in complete heart block.  Judith Flores switch to DDDR and make rate response more aggressive.  Hopefully this Judith Flores help with her fatigue.  If not, may need to plan for transthoracic echo.  2.  Paroxysmal atrial fibrillation: On metoprolol  and diltiazem .  0.3% burden.  Judith Flores continue with current management.  She is feeling poorly with fatigue.  She Judith Flores take metoprolol  at night.  3.  Secondary hypercoagulable state: On Xarelto  for atrial fibrillation  4.  Coronary artery disease: No current chest pain.  Plan per primary cardiology  Disposition:   Follow up with Judith Flores in 12 months  Signed, Judith Kadlec Cortland Ding, MD

## 2024-01-29 NOTE — Patient Instructions (Signed)
 Medication Instructions:  Your physician has recommended you make the following change in your medication:  CHANGE the way you take your Metoprolol  -- take it at bedtime  *If you need a refill on your cardiac medications before your next appointment, please call your pharmacy*  Lab Work: None ordered   Testing/Procedures: None ordered  Follow-Up: At [facility], you and your health needs are our priority.  As part of our continuing mission to provide you with exceptional heart care, our providers are all part of one team.  This team includes your primary Cardiologist (physician) and Advanced Practice Providers or APPs (Physician Assistants and Nurse Practitioners) who all work together to provide you with the care you need, when you need it.  Your next appointment:   1 year(s)  Provider:   Agatha Horsfall, MD     Thank you for choosing [facility]!!   Reece Cane, RN [phone]

## 2024-02-01 ENCOUNTER — Ambulatory Visit: Payer: Self-pay | Admitting: Cardiology

## 2024-02-09 ENCOUNTER — Telehealth: Payer: Self-pay

## 2024-02-09 NOTE — Telephone Encounter (Signed)
 Patient saw Dr. Lawana Pray on 5/19 and we made adjustments to Rate Response to attempt to help with her severe lack of energy and difficulty with ADL's.  Spoke with patient today to follow up after changes.  She states she is doing so much better and the changes have helped her "a whole lot".  We will continue to monitor her remotely and see her back at her next visit in the future. She knows to call our office if any concerns.

## 2024-03-12 ENCOUNTER — Ambulatory Visit: Payer: Medicare Other

## 2024-03-12 DIAGNOSIS — I442 Atrioventricular block, complete: Secondary | ICD-10-CM

## 2024-03-12 LAB — CUP PACEART REMOTE DEVICE CHECK
Battery Remaining Longevity: 33 mo
Battery Remaining Percentage: 31 %
Battery Voltage: 2.93 V
Brady Statistic AP VP Percent: 93 %
Brady Statistic AP VS Percent: 1 %
Brady Statistic AS VP Percent: 7.3 %
Brady Statistic AS VS Percent: 1 %
Brady Statistic RA Percent Paced: 84 %
Brady Statistic RV Percent Paced: 99 %
Date Time Interrogation Session: 20250701032244
Implantable Lead Connection Status: 753985
Implantable Lead Connection Status: 753985
Implantable Lead Implant Date: 20130321
Implantable Lead Implant Date: 20130321
Implantable Lead Location: 753859
Implantable Lead Location: 753860
Implantable Pulse Generator Implant Date: 20190503
Lead Channel Impedance Value: 330 Ohm
Lead Channel Impedance Value: 600 Ohm
Lead Channel Pacing Threshold Amplitude: 0.375 V
Lead Channel Pacing Threshold Amplitude: 0.5 V
Lead Channel Pacing Threshold Pulse Width: 0.4 ms
Lead Channel Pacing Threshold Pulse Width: 0.4 ms
Lead Channel Sensing Intrinsic Amplitude: 1 mV
Lead Channel Sensing Intrinsic Amplitude: 12 mV
Lead Channel Setting Pacing Amplitude: 0.625
Lead Channel Setting Pacing Amplitude: 2 V
Lead Channel Setting Pacing Pulse Width: 0.4 ms
Lead Channel Setting Sensing Sensitivity: 6 mV
Pulse Gen Model: 2272
Pulse Gen Serial Number: 9019281

## 2024-03-18 ENCOUNTER — Ambulatory Visit: Payer: Self-pay | Admitting: Cardiology

## 2024-05-31 ENCOUNTER — Other Ambulatory Visit: Payer: Self-pay | Admitting: Cardiology

## 2024-05-31 NOTE — Telephone Encounter (Signed)
 Pt last saw Dr Inocencio 01/29/24, last labs 05/16/24 Creat 1.20, age 89, weight 87.5kg, CrCl 44.76, based on specified criteria pt is on appropriate dosage of Xarelto  15mg  every day for afib.  Will refill rx.

## 2024-06-11 ENCOUNTER — Ambulatory Visit: Payer: Medicare Other

## 2024-06-11 DIAGNOSIS — I442 Atrioventricular block, complete: Secondary | ICD-10-CM | POA: Diagnosis not present

## 2024-06-13 ENCOUNTER — Ambulatory Visit: Payer: Self-pay | Admitting: Cardiology

## 2024-06-13 LAB — CUP PACEART REMOTE DEVICE CHECK
Battery Remaining Longevity: 29 mo
Battery Remaining Percentage: 28 %
Battery Voltage: 2.92 V
Brady Statistic AP VP Percent: 94 %
Brady Statistic AP VS Percent: 1 %
Brady Statistic AS VP Percent: 6.3 %
Brady Statistic AS VS Percent: 1 %
Brady Statistic RA Percent Paced: 82 %
Brady Statistic RV Percent Paced: 99 %
Date Time Interrogation Session: 20250930020016
Implantable Lead Connection Status: 753985
Implantable Lead Connection Status: 753985
Implantable Lead Implant Date: 20130321
Implantable Lead Implant Date: 20130321
Implantable Lead Location: 753859
Implantable Lead Location: 753860
Implantable Pulse Generator Implant Date: 20190503
Lead Channel Impedance Value: 310 Ohm
Lead Channel Impedance Value: 530 Ohm
Lead Channel Pacing Threshold Amplitude: 0.375 V
Lead Channel Pacing Threshold Amplitude: 0.5 V
Lead Channel Pacing Threshold Pulse Width: 0.4 ms
Lead Channel Pacing Threshold Pulse Width: 0.4 ms
Lead Channel Sensing Intrinsic Amplitude: 1.3 mV
Lead Channel Sensing Intrinsic Amplitude: 12 mV
Lead Channel Setting Pacing Amplitude: 0.625
Lead Channel Setting Pacing Amplitude: 2 V
Lead Channel Setting Pacing Pulse Width: 0.4 ms
Lead Channel Setting Sensing Sensitivity: 6 mV
Pulse Gen Model: 2272
Pulse Gen Serial Number: 9019281

## 2024-06-13 NOTE — Progress Notes (Signed)
 Remote PPM Transmission

## 2024-06-17 NOTE — Progress Notes (Signed)
 Remote PPM Transmission

## 2024-08-22 DIAGNOSIS — R4589 Other symptoms and signs involving emotional state: Secondary | ICD-10-CM | POA: Insufficient documentation

## 2024-09-10 ENCOUNTER — Ambulatory Visit (INDEPENDENT_AMBULATORY_CARE_PROVIDER_SITE_OTHER): Payer: Medicare Other

## 2024-09-10 DIAGNOSIS — I442 Atrioventricular block, complete: Secondary | ICD-10-CM | POA: Diagnosis not present

## 2024-09-10 LAB — CUP PACEART REMOTE DEVICE CHECK
Battery Remaining Longevity: 25 mo
Battery Remaining Percentage: 25 %
Battery Voltage: 2.92 V
Brady Statistic AP VP Percent: 93 %
Brady Statistic AP VS Percent: 1 %
Brady Statistic AS VP Percent: 7.1 %
Brady Statistic AS VS Percent: 1 %
Brady Statistic RA Percent Paced: 82 %
Brady Statistic RV Percent Paced: 99 %
Date Time Interrogation Session: 20251230074434
Implantable Lead Connection Status: 753985
Implantable Lead Connection Status: 753985
Implantable Lead Implant Date: 20130321
Implantable Lead Implant Date: 20130321
Implantable Lead Location: 753859
Implantable Lead Location: 753860
Implantable Pulse Generator Implant Date: 20190503
Lead Channel Impedance Value: 330 Ohm
Lead Channel Impedance Value: 550 Ohm
Lead Channel Pacing Threshold Amplitude: 0.5 V
Lead Channel Pacing Threshold Amplitude: 0.5 V
Lead Channel Pacing Threshold Pulse Width: 0.4 ms
Lead Channel Pacing Threshold Pulse Width: 0.4 ms
Lead Channel Sensing Intrinsic Amplitude: 1 mV
Lead Channel Sensing Intrinsic Amplitude: 12 mV
Lead Channel Setting Pacing Amplitude: 0.75 V
Lead Channel Setting Pacing Amplitude: 2 V
Lead Channel Setting Pacing Pulse Width: 0.4 ms
Lead Channel Setting Sensing Sensitivity: 6 mV
Pulse Gen Model: 2272
Pulse Gen Serial Number: 9019281

## 2024-09-13 ENCOUNTER — Ambulatory Visit: Payer: Self-pay | Admitting: Cardiology

## 2024-09-18 ENCOUNTER — Encounter: Payer: Self-pay | Admitting: *Deleted

## 2024-09-18 NOTE — Progress Notes (Signed)
 Remote PPM Transmission

## 2024-09-20 ENCOUNTER — Ambulatory Visit: Attending: Cardiology | Admitting: Cardiology

## 2024-09-20 ENCOUNTER — Encounter: Payer: Self-pay | Admitting: Cardiology

## 2024-09-20 VITALS — BP 138/60 | HR 80 | Ht 66.0 in | Wt 193.8 lb

## 2024-09-20 DIAGNOSIS — I442 Atrioventricular block, complete: Secondary | ICD-10-CM

## 2024-09-20 DIAGNOSIS — I255 Ischemic cardiomyopathy: Secondary | ICD-10-CM

## 2024-09-20 DIAGNOSIS — E782 Mixed hyperlipidemia: Secondary | ICD-10-CM

## 2024-09-20 DIAGNOSIS — I2583 Coronary atherosclerosis due to lipid rich plaque: Secondary | ICD-10-CM

## 2024-09-20 DIAGNOSIS — I2510 Atherosclerotic heart disease of native coronary artery without angina pectoris: Secondary | ICD-10-CM

## 2024-09-20 NOTE — Progress Notes (Signed)
 " Cardiology Office Note:    Date:  09/20/2024   ID:  Judith Flores, DOB 1933-11-13, MRN [id number]  PCP:  Seena Thom POUR, MD  Cardiologist:  Lamar Fitch, MD    Referring MD: Seena Thom POUR, MD   No chief complaint on file. Doing fine  History of Present Illness:    Judith Flores is a 89 y.o. female past medical history significant for coronary disease in 2019 she required PTCA and stenting of the LAD with drug-eluting stent, she was also find to have completely occluded small first diagonal branch at the time, additional problem include dyslipidemia, some difficulty tolerating statin, pacemaker present, paroxysmal atrial fibrillation.  Comes today to months for follow-up overall cardiac wise doing fine.  She was in the hospital because of UTI and sepsis.  Did quite okay after that she is in physical therapy that she complained is very hard for her but at the same time she described the fact that she is very beneficial to her.  She also has some weakness fatigue tiredness some medication has been modified and now she is better.  Denies having palpitation chest pain  Past Medical History:  Diagnosis Date   Acute bronchitis 01/17/2011   Acute cystitis with hematuria 01/17/2011   Last Assessment & Plan:  Formatting of this note might be different from the original. Patient presents with history and physical exam as documented low that is most consistent with likely acute cystitis with hematuria. Unlikely to be vaginal source per patient and as such do not suspect that this should remain on the differential. Unlikely that this represents underlying malignancy with her point   Acute URI 08/19/2015   Anemia    Asteatotic dermatitis 10/16/2017   Atrial fibrillation (HCC) 02/16/2012   Atrioventricular block, complete (HCC) 11/17/2011   B12 deficiency 08/10/2016   Back pain 03/27/2014   Bilateral lower extremity edema 02/07/2017   CAD (coronary artery disease)    a. 08/2018 NSTEMI/PCI: LM nl, LAD 95p/m  (3.5 x 12 mm resolute Onyx DES and 2.5 x 38 mm resolute Onyx DES successfully placed), D1 100, D2 40, RI 95 (2.25 x 38 mm resolute Onyx DES), LCx 30p, RCA 19m 40d.   CKD (chronic kidney disease), stage III (HCC) 09/04/2018   Complete heart block (HCC)    intermittent   Depression screening 11/13/2013   Overview:  PHQ2 Done 11/13/13   Elevated troponin 09/04/2018   Epigastric pain 09/04/2018   GERD (gastroesophageal reflux disease)    HOH (hard of hearing)    HTN (hypertension)    Hyperlipidemia 08/12/2010   Hypertension 02/16/2012   Hypertension, benign 08/12/2010   Last Assessment & Plan:  Formatting of this note might be different from the original. Controlled based on today's blood pressure of 120/74. Last BMP was checked 08/13/2020 and showed creatinine at 1.50 which appears to be patient's baseline and mild hypokalemia at 3.3. Patient is taking antihypertensive regimen of diltiazem  120 mg PO QD, Lasix  20 mg PO QD, metoprolol  tartrate 50 mg PO BID, Klor-Co   Iron deficiency 08/19/2015   Ischemic cardiomyopathy    a. 08/2018 Echo: Ef 50-55%, mild apical anteroseptal and apical HK. Gr1 DD. Triv TR. PaSP .   NSTEMI (non-ST elevated myocardial infarction) (HCC) 09/04/2018   Pacemaker    PAF (paroxysmal atrial fibrillation) (HCC)    Pain of right lower extremity 06/12/2017   Peripheral edema    polarity lead revision in both atrial and ventricular lead 11/17/2011   Recurrent upper respiratory  infection (URI)    HX OF CHRONIC BRONCHITIS   Stage 3b chronic kidney disease (HCC) 08/19/2015   Last Assessment & Plan:  Formatting of this note might be different from the original. Stable at this time given creatinine 08/13/2020 was 1.50 which appears to be near patient's baseline. Last magnesium and phosphorous were checked on 08/13/2020 and were within normal limits. Last CBC was checked on 08/13/2020 and was normal. Last urine microalbumin to creatinine ratio was normal on 08/13/2020 -Conti   Statin  intolerance 10/19/2018   Syncope    SYNCOPE, HX OF 03/17/2009   Qualifier: Diagnosis of  By: Fernande, MD, CODY Elspeth Darner    Vitamin D deficiency 08/12/2010    Past Surgical History:  Procedure Laterality Date   breast tumor     CORONARY STENT INTERVENTION N/A 09/07/2018   Procedure: CORONARY STENT INTERVENTION;  Surgeon: Wonda Sharper, MD;  Location: [facility] INVASIVE CV LAB;  Service: Cardiovascular;  Laterality: N/A;   INSERT / REPLACE / REMOVE PACEMAKER     [name]   LEFT HEART CATH AND CORONARY ANGIOGRAPHY N/A 09/06/2018   Procedure: LEFT HEART CATH AND CORONARY ANGIOGRAPHY;  Surgeon: Wonda Sharper, MD;  Location: [facility] INVASIVE CV LAB;  Service: Cardiovascular;  Laterality: N/A;   PACEMAKER REVISION N/A 12/01/2011   Procedure: PACEMAKER REVISION;  Surgeon: Elspeth JAYSON Fernande, MD;  Location: [facility] CATH LAB;  Service: Cardiovascular;  Laterality: N/A;   PPM GENERATOR CHANGEOUT N/A 01/12/2018   Procedure: PPM GENERATOR CHANGEOUT;  Surgeon: Fernande Elspeth JAYSON, MD;  Location: [facility] INVASIVE CV LAB;  Service: Cardiovascular;  Laterality: N/A;    Current Medications: Active Medications[1]   Allergies:   Atorvastatin, Crestor  [rosuvastatin  calcium ], Niacin, Sulfa antibiotics, and Penicillins   Social History   Socioeconomic History   Marital status: Widowed    Spouse name: Not on file   Number of children: Not on file   Years of education: Not on file   Highest education level: Not on file  Occupational History   Not on file  Tobacco Use   Smoking status: Never   Smokeless tobacco: Never  Vaping Use   Vaping status: Never Used  Substance and Sexual Activity   Alcohol use: No   Drug use: No   Sexual activity: Never    Birth control/protection: Post-menopausal  Other Topics Concern   Not on file  Social History Narrative   ** Merged History Encounter **       Social Drivers of Health   Tobacco Use: Low Risk (09/20/2024)   Patient History    Smoking Tobacco Use: Never    Smokeless Tobacco  Use: Never    Passive Exposure: Not on file  Financial Resource Strain: Not on file  Food Insecurity: No Food Insecurity (05/16/2024)   Received from [facility]   Epic    Within the past 12 months, you worried that your food would run out before you got the money to buy more.: Never true    Within the past 12 months, the food you bought just didn't last and you didn't have money to get more.: Never true  Transportation Needs: No Transportation Needs (05/16/2024)   Received from [facility]   PRAPARE - Transportation    Lack of Transportation (Medical): No    Lack of Transportation (Non-Medical): No  Physical Activity: Not on file  Stress: Not on file  Social Connections: Not on file  Depression (EYV7-0): Not on file  Alcohol Screen: Not on file  Housing: Not on file  Utilities: Low Risk (05/16/2024)   Received from [facility]   Utilities    Within the past 12 months, have you been unable to get utilities(heat, electricity) when it was really needed?: No  Health Literacy: Not on file     Family History: The patient's family history includes Heart disease in her father and mother. ROS:   Please see the history of present illness.    All 14 point review of systems negative except as described per history of present illness  EKGs/Labs/Other Studies Reviewed:         Recent Labs: 10/11/2023: BUN 19; Creatinine, Ser 1.39; NT-Pro BNP 816; Potassium 4.6; Sodium 143  Recent Lipid Panel    Component Value Date/Time   CHOL 179 06/17/2022 1204   TRIG 236 (H) 06/17/2022 1204   HDL 39 (L) 06/17/2022 1204   CHOLHDL 4.6 (H) 06/17/2022 1204   CHOLHDL 6.2 09/05/2018 0048   VLDL 37 09/05/2018 0048   LDLCALC 100 (H) 06/17/2022 1204    Physical Exam:    VS:  BP 138/60   Pulse 80   Ht 5' 6 (1.676 m)   Wt 193 lb 12.8 oz (87.9 kg)   SpO2 98%   BMI 31.28 kg/m     Wt Readings from Last 3 Encounters:  09/20/24 193 lb 12.8 oz (87.9 kg)  01/29/24 193 lb (87.5 kg)  10/11/23  194 lb 9.6 oz (88.3 kg)     GEN:  Well nourished, well developed in no acute distress HEENT: Normal NECK: No JVD; No carotid bruits LYMPHATICS: No lymphadenopathy CARDIAC: RRR, no murmurs, no rubs, no gallops RESPIRATORY:  Clear to auscultation without rales, wheezing or rhonchi  ABDOMEN: Soft, non-tender, non-distended MUSCULOSKELETAL:  No edema; No deformity  SKIN: Warm and dry LOWER EXTREMITIES: no swelling NEUROLOGIC:  Alert and oriented x 3 PSYCHIATRIC:  Normal affect   ASSESSMENT:    1. Coronary artery disease due to lipid rich plaque   2. Ischemic cardiomyopathy   3. Complete heart block (HCC)   4. Mixed hyperlipidemia    PLAN:    In order of problems listed above:  Paroxysmal atrial fibrillation.  I did review interrogation of the device 14.7% atrial fibrillation, continue anticoagulation with Xarelto  15 mg daily. History of ischemic cardiomyopathy will repeat echocardiogram but first will check if any echocardiogram has been done in the hospital. Complete heart block addressed with pacemaker I did review interrogation of the device normal function 14% burden 2.1 years left on the device normal leads function continue monitoring. Dyslipidemia, I do not have a recent fasting lipid profile will make arrangements for cholesterol to be checked   Medication Adjustments/Labs and Tests Ordered: Current medicines are reviewed at length with the patient today.  Concerns regarding medicines are outlined above.  No orders of the defined types were placed in this encounter.  Medication changes: No orders of the defined types were placed in this encounter.   Signed, Lamar DOROTHA Fitch, MD, [facility] 09/20/2024 1:14 PM    [facility]    [1]  Current Meds  Medication Sig   clopidogrel  (PLAVIX ) 75 MG tablet Take 75 mg by mouth daily.   Coenzyme Q10 (COQ10) 100 MG CAPS Take 1 capsule by mouth daily.   Cranberry-Vitamin C-Probiotic (AZO CRANBERRY PO) Take 1  tablet by mouth daily.   cyanocobalamin (VITAMIN B12) 1000 MCG/ML injection 1,000 mcg.   diltiazem  (TIAZAC ) 120 MG 24 hr capsule Take 120 mg by  mouth daily.   ferrous sulfate  325 (65 FE) MG tablet Take 325 mg by mouth daily with breakfast.   metoprolol  succinate (TOPROL -XL) 100 MG 24 hr tablet TAKE 1 AND 1/2 TABLETS(150 MG) BY MOUTH DAILY   Multiple Vitamins-Minerals (PRESERVISION AREDS 2 PO) Take 1 tablet by mouth in the morning and at bedtime.   nitroGLYCERIN  (NITROSTAT ) 0.4 MG SL tablet Place 1 tablet (0.4 mg total) under the tongue every 5 (five) minutes x 3 doses as needed for chest pain.   omeprazole (PRILOSEC) 20 MG capsule Take 20 mg by mouth daily.    potassium chloride SA (KLOR-CON M) 20 MEQ tablet Take 20 mEq by mouth 2 (two) times daily. (Patient taking differently: Take 20 mEq by mouth daily.)   rosuvastatin  (CRESTOR ) 5 MG tablet Take 5 mg by mouth daily.   sertraline (ZOLOFT) 25 MG tablet Take 25 mg by mouth daily.   Vitamin D, Ergocalciferol, (DRISDOL) 1.25 MG (50000 UT) CAPS capsule Take 50,000 Units by mouth every 7 (seven) days.   XARELTO  15 MG TABS tablet TAKE 1 TABLET(15 MG) BY MOUTH DAILY   "

## 2024-09-20 NOTE — Addendum Note (Signed)
 Addended by: ONEITA BERLINER on: 09/20/2024 01:35 PM   Modules accepted: Orders

## 2024-09-20 NOTE — Patient Instructions (Signed)
 Medication Instructions:  Your physician recommends that you continue on your current medications as directed. Please refer to the Current Medication list given to you today.  *If you need a refill on your cardiac medications before your next appointment, please call your pharmacy*   Lab Work: None ordered If you have labs (blood work) drawn today and your tests are completely normal, you will receive your results only by: MyChart Message (if you have MyChart) OR A paper copy in the mail If you have any lab test that is abnormal or we need to change your treatment, we will call you to review the results.  Testing/Procedures: Your physician has requested that you have an echocardiogram. Echocardiography is a painless test that uses sound waves to create images of your heart. It provides your doctor with information about the size and shape of your heart and how well your heart's chambers and valves are working. This procedure takes approximately one hour. There are no restrictions for this procedure. Please do NOT wear cologne, perfume, aftershave, or lotions (deodorant is allowed). Please arrive 15 minutes prior to your appointment time.  Please note: We ask at that you not bring children with you during ultrasound (echo/ vascular) testing. Due to room size and safety concerns, children are not allowed in the ultrasound rooms during exams. Our front office staff cannot provide observation of children in our lobby area while testing is being conducted. An adult accompanying a patient to their appointment will only be allowed in the ultrasound room at the discretion of the ultrasound technician under special circumstances. We apologize for any inconvenience.  Follow-Up: At [facility], you and your health needs are our priority.  As part of our continuing mission to provide you with exceptional heart care, we have created designated Provider Care Teams.  These Care Teams include your primary  Cardiologist (physician) and Advanced Practice Providers (APPs -  Physician Assistants and Nurse Practitioners) who all work together to provide you with the care you need, when you need it.  We recommend signing up for the patient portal called MyChart.  Sign up information is provided on this After Visit Summary.  MyChart is used to connect with patients for Virtual Visits (Telemedicine).  Patients are able to view lab/test results, encounter notes, upcoming appointments, etc.  Non-urgent messages can be sent to your provider as well.   To learn more about what you can do with MyChart, go to [email or web address].    Your next appointment:   6 month(s)  The format for your next appointment:   In Person  Provider:   Lamar Fitch, MD   Other Instructions Echocardiogram An echocardiogram is a test that uses sound waves (ultrasound) to produce images of the heart. Images from an echocardiogram can provide important information about: Heart size and shape. The size and thickness and movement of your heart's walls. Heart muscle function and strength. Heart valve function or if you have stenosis. Stenosis is when the heart valves are too narrow. If blood is flowing backward through the heart valves (regurgitation). A tumor or infectious growth around the heart valves. Areas of heart muscle that are not working well because of poor blood flow or injury from a heart attack. Aneurysm detection. An aneurysm is a weak or damaged part of an artery wall. The wall bulges out from the normal force of blood pumping through the body. Tell a health care provider about: Any allergies you have. All medicines you are taking, including vitamins, herbs,  eye drops, creams, and over-the-counter medicines. Any blood disorders you have. Any surgeries you have had. Any medical conditions you have. Whether you are pregnant or may be pregnant. What are the risks? Generally, this is a safe test.  However, problems may occur, including an allergic reaction to dye (contrast) that may be used during the test. What happens before the test? No specific preparation is needed. You may eat and drink normally. What happens during the test? You will take off your clothes from the waist up and put on a hospital gown. Electrodes or electrocardiogram (ECG)patches may be placed on your chest. The electrodes or patches are then connected to a device that monitors your heart rate and rhythm. You will lie down on a table for an ultrasound exam. A gel will be applied to your chest to help sound waves pass through your skin. A handheld device, called a transducer, will be pressed against your chest and moved over your heart. The transducer produces sound waves that travel to your heart and bounce back (or echo back) to the transducer. These sound waves will be captured in real-time and changed into images of your heart that can be viewed on a video monitor. The images will be recorded on a computer and reviewed by your health care provider. You may be asked to change positions or hold your breath for a short time. This makes it easier to get different views or better views of your heart. In some cases, you may receive contrast through an IV in one of your veins. This can improve the quality of the pictures from your heart. The procedure may vary among health care providers and hospitals.   What can I expect after the test? You may return to your normal, everyday life, including diet, activities, and medicines, unless your health care provider tells you not to do that. Follow these instructions at home: It is up to you to get the results of your test. Ask your health care provider, or the department that is doing the test, when your results will be ready. Keep all follow-up visits. This is important. Summary An echocardiogram is a test that uses sound waves (ultrasound) to produce images of the heart. Images  from an echocardiogram can provide important information about the size and shape of your heart, heart muscle function, heart valve function, and other possible heart problems. You do not need to do anything to prepare before this test. You may eat and drink normally. After the echocardiogram is completed, you may return to your normal, everyday life, unless your health care provider tells you not to do that. This information is not intended to replace advice given to you by your health care provider. Make sure you discuss any questions you have with your health care provider. Document Revised: 04/21/2020 Document Reviewed: 04/21/2020 Elsevier Patient Education  2021 [employer]   Important Information About Sugar

## 2024-10-17 ENCOUNTER — Ambulatory Visit
